# Patient Record
Sex: Male | Born: 1960 | Race: White | Hispanic: No | Marital: Married | Smoking: Former smoker
Health system: Southern US, Community
[De-identification: ages and names within clinical notes are randomized; demographics above are authoritative.]

## PROBLEM LIST (undated history)

## (undated) DIAGNOSIS — K219 Gastro-esophageal reflux disease without esophagitis: Secondary | ICD-10-CM

## (undated) HISTORY — PX: SHOULDER BIOPSY: SHX2404

## (undated) HISTORY — PX: APPENDECTOMY: SHX54

## (undated) HISTORY — DX: Gastro-esophageal reflux disease without esophagitis: K21.9

## (undated) HISTORY — PX: KNEE ARTHROSCOPY: SUR90

---

## 1997-04-28 ENCOUNTER — Ambulatory Visit (HOSPITAL_COMMUNITY): Admission: RE | Admit: 1997-04-28 | Discharge: 1997-04-28 | Payer: Self-pay | Admitting: *Deleted

## 1998-08-30 ENCOUNTER — Emergency Department (HOSPITAL_COMMUNITY): Admission: EM | Admit: 1998-08-30 | Discharge: 1998-08-30 | Payer: Self-pay | Admitting: Emergency Medicine

## 2001-03-06 ENCOUNTER — Encounter: Payer: Self-pay | Admitting: General Surgery

## 2001-03-06 ENCOUNTER — Ambulatory Visit (HOSPITAL_COMMUNITY): Admission: RE | Admit: 2001-03-06 | Discharge: 2001-03-06 | Payer: Self-pay | Admitting: General Surgery

## 2001-05-05 ENCOUNTER — Encounter: Payer: Self-pay | Admitting: Specialist

## 2001-05-09 ENCOUNTER — Encounter (INDEPENDENT_AMBULATORY_CARE_PROVIDER_SITE_OTHER): Payer: Self-pay | Admitting: Specialist

## 2001-05-09 ENCOUNTER — Ambulatory Visit (HOSPITAL_COMMUNITY): Admission: RE | Admit: 2001-05-09 | Discharge: 2001-05-09 | Payer: Self-pay | Admitting: Specialist

## 2003-11-11 ENCOUNTER — Ambulatory Visit: Payer: Self-pay | Admitting: Gastroenterology

## 2003-11-12 ENCOUNTER — Ambulatory Visit: Payer: Self-pay | Admitting: Gastroenterology

## 2004-07-24 ENCOUNTER — Ambulatory Visit: Payer: Self-pay | Admitting: Internal Medicine

## 2005-09-03 ENCOUNTER — Ambulatory Visit: Payer: Self-pay | Admitting: Internal Medicine

## 2005-09-27 ENCOUNTER — Ambulatory Visit: Payer: Self-pay

## 2006-04-02 ENCOUNTER — Ambulatory Visit: Payer: Self-pay | Admitting: Endocrinology

## 2006-09-23 DIAGNOSIS — E785 Hyperlipidemia, unspecified: Secondary | ICD-10-CM | POA: Insufficient documentation

## 2006-09-23 DIAGNOSIS — K219 Gastro-esophageal reflux disease without esophagitis: Secondary | ICD-10-CM | POA: Insufficient documentation

## 2006-09-23 DIAGNOSIS — Z9089 Acquired absence of other organs: Secondary | ICD-10-CM | POA: Insufficient documentation

## 2006-09-23 DIAGNOSIS — R002 Palpitations: Secondary | ICD-10-CM | POA: Insufficient documentation

## 2006-09-23 DIAGNOSIS — F17200 Nicotine dependence, unspecified, uncomplicated: Secondary | ICD-10-CM

## 2007-08-05 ENCOUNTER — Ambulatory Visit: Payer: Self-pay | Admitting: Internal Medicine

## 2007-08-05 DIAGNOSIS — H6690 Otitis media, unspecified, unspecified ear: Secondary | ICD-10-CM | POA: Insufficient documentation

## 2007-08-27 ENCOUNTER — Telehealth (INDEPENDENT_AMBULATORY_CARE_PROVIDER_SITE_OTHER): Payer: Self-pay | Admitting: *Deleted

## 2007-08-27 DIAGNOSIS — R42 Dizziness and giddiness: Secondary | ICD-10-CM | POA: Insufficient documentation

## 2007-09-09 ENCOUNTER — Encounter: Payer: Self-pay | Admitting: Internal Medicine

## 2008-10-05 ENCOUNTER — Encounter: Payer: Self-pay | Admitting: Emergency Medicine

## 2008-10-06 ENCOUNTER — Inpatient Hospital Stay (HOSPITAL_COMMUNITY): Admission: EM | Admit: 2008-10-06 | Discharge: 2008-10-08 | Payer: Self-pay | Admitting: Emergency Medicine

## 2010-04-14 LAB — BASIC METABOLIC PANEL
BUN: 16 mg/dL (ref 6–23)
CO2: 27 mEq/L (ref 19–32)
Calcium: 8.6 mg/dL (ref 8.4–10.5)
Chloride: 105 mEq/L (ref 96–112)
Chloride: 106 mEq/L (ref 96–112)
Creatinine, Ser: 0.88 mg/dL (ref 0.4–1.5)
GFR calc Af Amer: >60 mL/min (ref >60)
GFR calc Af Amer: >60 mL/min (ref >60)
GFR calc non Af Amer: >60 mL/min (ref >60)
Potassium: 4 mEq/L (ref 3.5–5.1)
Sodium: 138 mEq/L (ref 135–145)
Sodium: 139 mEq/L (ref 135–145)

## 2010-04-14 LAB — CBC
HCT: 46.3 % (ref 39.0–52.0)
Hemoglobin: 13.2 g/dL (ref 13.0–17.0)
MCV: 86.1 fL (ref 78.0–100.0)
Platelets: 243 10*3/uL (ref 150–400)
RBC: 5.38 MIL/uL (ref 4.22–5.81)
RBC: 4.5 MIL/uL (ref 4.22–5.81)
WBC: 14.6 10*3/uL — ABNORMAL HIGH (ref 4.0–10.5)
WBC: 10.1 10*3/uL (ref 4.0–10.5)

## 2010-04-14 LAB — APTT: aPTT: 26 seconds (ref 24–37)

## 2010-05-26 NOTE — Op Note (Signed)
[facility]  Patient:    Shaun Clarke, Shaun Clarke Visit Number: [id number] MRN: [id number]          Service Type: DSU Location: DAY Attending Physician:  Pierce Crane Dictated by:   Javier Docker, M.D. Proc. Date: 05/09/01 Admit Date:  05/09/2001                             Operative Report  PREOPERATIVE DIAGNOSIS:  Recurrent lipoma of the right shoulder.  POSTOPERATIVE DIAGNOSIS:  Recurrent lipoma of the right shoulder.  PROCEDURE:  Incision of the lipoma right shoulder, revision of the scar, right shoulder.  SURGEON:  Javier Docker, M.D.  ANESTHESIA:  General.  BRIEF HISTORY AND INDICATION:  A 50 year old who had had previous lipoma of the right shoulder resected by a general surgeon and that found that lipoma has recurred and the MRI has indicated that we could not rule out an osteosarcoma. Operative intervention is indicated with resection of the lipoma for pathological evaluation. Risks and benefits discussed, including bleeding, infection, injury to vascular structures, recurrent lipoma, residual scar tissue, etc.  TECHNIQUE:  The patient was supine in the beach-chair position. After satisfactory level of general anesthesia and one gram of Kefzol, the right shoulder was prepped and draped in the usual sterile fashion. Marcaine 0.25% with epinephrine was infiltrated in the subcutaneous tissue. Previous surgical scar was excised with a #15 blade through the subcutaneous tissue. The incision was extended on either side of the previous surgical scar. Immediately, in the subcutaneous tissue was a lipoma noted. It was, however, not fully encapsulated. This was secured with an Allis and then dissected free from the surrounding soft tissues utilizing tenotomy scissors. This was excised in its entirety down to the fascia overlying the acromion. This was in the posterior aspect of the acromion. The anterior leading edge was to the  [facility] joint. There was no extension in the [facility] joint that was noted. Electrocautery was used to achieve strict hemostasis. The subcutaneous tissue was the infiltrated with 0.25% Marcaine. The lipoma measured approximately 4 x 3 x 3 cm. This did not have any evidence of obvious necrosis or malignant tissue. This was sent to pathology for permanent analysis. Next, electrocautery was utilized to achieve strict hemostasis. The soft tissue was closed in multiple layers with 2-0 Vicryl simple sutures and the skin was reapproximated with 4-0 subcuticular Prolene reinforced with Steri-Strips. The wound was dressed with a compression dressing. The patient was placed in a sling, extubated without difficulty and transported to the recovery in satisfactory condition.  The patient tolerated the procedure well without complications. Dictated by:   Javier Docker, M.D. Attending Physician:  Pierce Crane DD:  05/09/01 TD:  05/11/01 Job: [id number]

## 2020-02-17 ENCOUNTER — Encounter: Payer: Self-pay | Admitting: Family Medicine

## 2020-02-17 ENCOUNTER — Other Ambulatory Visit: Payer: Self-pay

## 2020-02-17 ENCOUNTER — Ambulatory Visit (INDEPENDENT_AMBULATORY_CARE_PROVIDER_SITE_OTHER): Payer: 59 | Admitting: Family Medicine

## 2020-02-17 VITALS — BP 115/68 | HR 85 | Ht 68.0 in | Wt 176.0 lb

## 2020-02-17 DIAGNOSIS — F17200 Nicotine dependence, unspecified, uncomplicated: Secondary | ICD-10-CM

## 2020-02-17 DIAGNOSIS — Z0001 Encounter for general adult medical examination with abnormal findings: Secondary | ICD-10-CM | POA: Diagnosis not present

## 2020-02-17 DIAGNOSIS — K219 Gastro-esophageal reflux disease without esophagitis: Secondary | ICD-10-CM | POA: Diagnosis not present

## 2020-02-17 DIAGNOSIS — Z0000 Encounter for general adult medical examination without abnormal findings: Secondary | ICD-10-CM

## 2020-02-17 MED ORDER — SUCRALFATE 1 G PO TABS: 1.0000 g | ORAL_TABLET | Freq: Two times a day (BID) | ORAL | 2 refills | Status: DC

## 2020-02-17 MED ORDER — PANTOPRAZOLE SODIUM 40 MG PO TBEC: 40.0000 mg | DELAYED_RELEASE_TABLET | Freq: Every day | ORAL | 3 refills | Status: DC

## 2020-02-17 NOTE — Progress Notes (Signed)
BP 115/68   Pulse 85   Ht '5\' 8"'  (1.727 m)   Wt 176 lb (79.8 kg)   SpO2 97%   BMI 26.76 kg/m    Subjective:    Patient ID: Shaun Clarke, male    DOB: Mar 13, 1960, 60 y.o.   MRN: [id number]  HPI: Shaun Clarke is a 60 y.o. male presenting on 02/17/2020 for Medical Management of Chronic Issues (CPE) and Gastroesophageal Reflux   HPI Adult well Is coming in today for adult well exam and physical.  Patient denies any chest pain, shortness of breath, headaches or vision issues, abdominal complaints, diarrhea, nausea, vomiting, or joint issues.   GERD Patient is currently on medications currently except over-the-counter Pepcid and occasional Mylanta. He does complain of belching and burping bloating, he says he has had this off and on for over the past 16 years but it has been more constant over the past 5 months to where he is having a lot of. he denies any blood in her stool or lightheadedness or dizziness.   Relevant past medical, surgical, family and social history reviewed and updated as indicated. Interim medical history since our last visit reviewed. Allergies and medications reviewed and updated.  Review of Systems  Constitutional: Negative for chills and fever.  HENT: Negative for ear pain and tinnitus.   Eyes: Negative for pain.  Respiratory: Negative for cough, shortness of breath and wheezing.   Cardiovascular: Negative for chest pain, palpitations and leg swelling.  Gastrointestinal: Positive for abdominal pain and nausea. Negative for blood in stool, constipation, diarrhea, rectal pain and vomiting.  Genitourinary: Negative for dysuria and hematuria.  Musculoskeletal: Negative for back pain, gait problem and myalgias.  Skin: Negative for rash.  Neurological: Negative for dizziness, weakness and headaches.  Psychiatric/Behavioral: Negative for suicidal ideas.  All other systems reviewed and are negative.   Per HPI unless specifically indicated above  Social  History   Socioeconomic History  . Marital status: Married    Spouse name: Not on file  . Number of children: Not on file  . Years of education: Not on file  . Highest education level: Not on file  Occupational History  . Not on file  Tobacco Use  . Smoking status: Current Every Day Smoker    Packs/day: 1.00  . Smokeless tobacco: Never Used  Vaping Use  . Vaping Use: Never used  Substance and Sexual Activity  . Alcohol use: Yes    Alcohol/week: 3.0 standard drinks    Types: 1 Glasses of wine, 2 Cans of beer per week    Comment: per week  . Drug use: Never  . Sexual activity: Yes    Partners: Female  Other Topics Concern  . Not on file  Social History Narrative  . Not on file   Social Determinants of Health   Financial Resource Strain: Not on file  Food Insecurity: Not on file  Transportation Needs: Not on file  Physical Activity: Not on file  Stress: Not on file  Social Connections: Not on file  Intimate Partner Violence: Not on file    Past Surgical History:  Procedure Laterality Date  . APPENDECTOMY      Family History  Problem Relation Age of Onset  . Pancreatic cancer Mother   . Diabetes Father   . Anxiety disorder Sister   . Multiple sclerosis Sister   . COPD Maternal Grandfather     Allergies as of 02/17/2020      Reactions  Naproxen Anaphylaxis   Swelling of face and lips      Medication List    as of February 17, 2020  4:22 PM   You have not been prescribed any medications.        Objective:    BP 115/68   Pulse 85   Ht '5\' 8"'  (1.727 m)   Wt 176 lb (79.8 kg)   SpO2 97%   BMI 26.76 kg/m   Wt Readings from Last 3 Encounters:  02/17/20 176 lb (79.8 kg)    Physical Exam Vitals and nursing note reviewed.  Constitutional:      General: He is not in acute distress.    Appearance: He is well-developed and well-nourished. He is not diaphoretic.  Eyes:     General: No scleral icterus.    Extraocular Movements: EOM normal.      Conjunctiva/sclera: Conjunctivae normal.  Neck:     Thyroid: No thyromegaly.  Cardiovascular:     Rate and Rhythm: Normal rate and regular rhythm.     Pulses: Intact distal pulses.     Heart sounds: Normal heart sounds. No murmur heard.   Pulmonary:     Effort: Pulmonary effort is normal. No respiratory distress.     Breath sounds: Normal breath sounds. No wheezing.  Abdominal:     General: Abdomen is flat. Bowel sounds are normal. There is no distension.     Tenderness: There is abdominal tenderness in the epigastric area. There is no right CVA tenderness, left CVA tenderness, guarding or rebound.  Musculoskeletal:        General: No edema. Normal range of motion.     Cervical back: Neck supple.  Lymphadenopathy:     Cervical: No cervical adenopathy.  Skin:    General: Skin is warm and dry.     Findings: No rash.  Neurological:     Mental Status: He is alert and oriented to person, place, and time.     Coordination: Coordination normal.  Psychiatric:        Mood and Affect: Mood and affect normal.        Behavior: Behavior normal.         Assessment & Plan:   Problem List Items Addressed This Visit      Digestive   GERD   Relevant Medications   pantoprazole (PROTONIX) 40 MG tablet   sucralfate (CARAFATE) 1 g tablet   Other Relevant Orders   Ambulatory referral to Gastroenterology     Other   TOBACCO USER    Other Visit Diagnoses    Well adult exam    -  Primary   Relevant Orders   CBC with Differential/Platelet (Completed)   CMP14+EGFR (Completed)   Lipid panel (Completed)   TSH (Completed)    Will try Protonix and Carafate to help.  Also refer to gastroenterology.  If his stomach not getting any better than they can take it from there, also gave a sample for 2 weeks of Dexilant.   Follow up plan: Return in about 2 months (around 04/16/2020), or if symptoms worsen or fail to improve, for gerd.  Caryl Pina, MD [facility] 02/17/2020, 4:22 PM

## 2020-02-18 LAB — CBC WITH DIFFERENTIAL/PLATELET
Basophils Absolute: 0.1 10*3/uL (ref 0.0–0.2)
Basos: 1 %
EOS (ABSOLUTE): 0.1 10*3/uL (ref 0.0–0.4)
Eos: 2 %
Hematocrit: 47.1 % (ref 37.5–51.0)
Hemoglobin: 16.1 g/dL (ref 13.0–17.7)
Immature Grans (Abs): 0 10*3/uL (ref 0.0–0.1)
Immature Granulocytes: 0 %
Lymphocytes Absolute: 1.4 10*3/uL (ref 0.7–3.1)
Lymphs: 23 %
MCH: 28.7 pg (ref 26.6–33.0)
MCHC: 34.2 g/dL (ref 31.5–35.7)
MCV: 84 fL (ref 79–97)
Monocytes Absolute: 0.8 10*3/uL (ref 0.1–0.9)
Monocytes: 13 %
Neutrophils Absolute: 3.7 10*3/uL (ref 1.4–7.0)
Neutrophils: 61 %
Platelets: 268 10*3/uL (ref 150–450)
RBC: 5.61 x10E6/uL (ref 4.14–5.80)
RDW: 13 % (ref 11.6–15.4)
WBC: 6.1 10*3/uL (ref 3.4–10.8)

## 2020-02-18 LAB — CMP14+EGFR
ALT: 16 IU/L (ref 0–44)
AST: 17 IU/L (ref 0–40)
Albumin/Globulin Ratio: 2 (ref 1.2–2.2)
Albumin: 4.7 g/dL (ref 3.8–4.9)
Alkaline Phosphatase: 76 IU/L (ref 44–121)
BUN/Creatinine Ratio: 14 (ref 9–20)
BUN: 14 mg/dL (ref 6–24)
Bilirubin Total: 0.4 mg/dL (ref 0.0–1.2)
CO2: 21 mmol/L (ref 20–29)
Calcium: 9.7 mg/dL (ref 8.7–10.2)
Chloride: 103 mmol/L (ref 96–106)
Creatinine, Ser: 1.02 mg/dL (ref 0.76–1.27)
GFR calc Af Amer: 93 mL/min/{1.73_m2} (ref >59)
GFR calc non Af Amer: 81 mL/min/{1.73_m2} (ref >59)
Globulin, Total: 2.3 g/dL (ref 1.5–4.5)
Glucose: 91 mg/dL (ref 65–99)
Potassium: 4.2 mmol/L (ref 3.5–5.2)
Sodium: 138 mmol/L (ref 134–144)
Total Protein: 7 g/dL (ref 6.0–8.5)

## 2020-02-18 LAB — LIPID PANEL
Chol/HDL Ratio: 5.6 ratio — ABNORMAL HIGH (ref 0.0–5.0)
Cholesterol, Total: 203 mg/dL — ABNORMAL HIGH (ref 100–199)
HDL: 36 mg/dL — ABNORMAL LOW (ref >39)
LDL Chol Calc (NIH): 139 mg/dL — ABNORMAL HIGH (ref 0–99)
Triglycerides: 154 mg/dL — ABNORMAL HIGH (ref 0–149)
VLDL Cholesterol Cal: 28 mg/dL (ref 5–40)

## 2020-02-18 LAB — TSH: TSH: 2.27 u[IU]/mL (ref 0.450–4.500)

## 2020-02-29 ENCOUNTER — Ambulatory Visit (INDEPENDENT_AMBULATORY_CARE_PROVIDER_SITE_OTHER): Payer: 59 | Admitting: Family Medicine

## 2020-02-29 ENCOUNTER — Ambulatory Visit (INDEPENDENT_AMBULATORY_CARE_PROVIDER_SITE_OTHER): Payer: 59

## 2020-02-29 ENCOUNTER — Encounter: Payer: Self-pay | Admitting: Family Medicine

## 2020-02-29 ENCOUNTER — Other Ambulatory Visit: Payer: Self-pay

## 2020-02-29 VITALS — BP 121/74 | HR 91 | Ht 68.0 in | Wt 176.0 lb

## 2020-02-29 DIAGNOSIS — M546 Pain in thoracic spine: Secondary | ICD-10-CM

## 2020-02-29 MED ORDER — METHYLPREDNISOLONE ACETATE 80 MG/ML IJ SUSP
80.0000 mg | Freq: Once | INTRAMUSCULAR | Status: AC
  Administered 2020-02-29: 80 mg via INTRAMUSCULAR

## 2020-02-29 NOTE — Progress Notes (Signed)
BP 121/74   Pulse 91   Ht 5\' 8"  (1.727 m)   Wt 176 lb (79.8 kg)   SpO2 97%   BMI 26.76 kg/m    Subjective:   Patient ID: Shaun Clarke, male    DOB: February 01, 1960, 60 y.o.   MRN: [id number]  HPI: Shaun Clarke is a 60 y.o. male presenting on 02/29/2020 for Back Pain   HPI Low back pain Patient is coming in for low back pain that is been bothering him over the past few weeks.  He says it started 1 evening and he feels like it has been going on since then.  He says he feels pain at night but then is able to sleep when he wakes up he feels better but then as the day progresses the back pain gets worse especially if he is on his feet or doing something.  He says sometimes he gets to the point where around 1 or 2 PM he has difficulty even sitting in the car on the way back home.  He does still complain of epigastric bloating and gas and abdominal pain but that has improved.  Patient denies any numbness or weakness or fevers or chills.  He does say hurts and the mid back region more on the left side.  Relevant past medical, surgical, family and social history reviewed and updated as indicated. Interim medical history since our last visit reviewed. Allergies and medications reviewed and updated.  Review of Systems  Constitutional: Negative for chills and fever.  Eyes: Negative for discharge.  Respiratory: Negative for shortness of breath and wheezing.   Cardiovascular: Negative for chest pain and leg swelling.  Gastrointestinal: Positive for abdominal pain. Negative for blood in stool, constipation, diarrhea, nausea and vomiting.  Genitourinary: Negative for dysuria, flank pain, frequency, hematuria and urgency.  Musculoskeletal: Positive for back pain. Negative for gait problem.  Skin: Negative for rash.  All other systems reviewed and are negative.   Per HPI unless specifically indicated above   Allergies as of 02/29/2020      Reactions   Naproxen Anaphylaxis   Swelling of  face and lips      Medication List       Accurate as of February 29, 2020 11:51 AM. If you have any questions, ask your nurse or doctor.        pantoprazole 40 MG tablet Commonly known as: PROTONIX Take 1 tablet (40 mg total) by mouth daily.   sucralfate 1 g tablet Commonly known as: Carafate Take 1 tablet (1 g total) by mouth 2 (two) times daily.        Objective:   BP 121/74   Pulse 91   Ht 5\' 8"  (1.727 m)   Wt 176 lb (79.8 kg)   SpO2 97%   BMI 26.76 kg/m   Wt Readings from Last 3 Encounters:  02/29/20 176 lb (79.8 kg)  02/17/20 176 lb (79.8 kg)    Physical Exam Vitals and nursing note reviewed.  Constitutional:      General: He is not in acute distress.    Appearance: He is well-developed and well-nourished. He is not diaphoretic.  Eyes:     General: No scleral icterus.    Extraocular Movements: EOM normal.     Conjunctiva/sclera: Conjunctivae normal.  Neck:     Thyroid: No thyromegaly.  Cardiovascular:     Pulses: Intact distal pulses.  Abdominal:     General: Abdomen is flat. Bowel sounds are normal. There  is no distension.     Tenderness: There is no abdominal tenderness. There is no right CVA tenderness, left CVA tenderness, guarding or rebound.  Musculoskeletal:        General: No edema. Normal range of motion.  Skin:    General: Skin is warm and dry.     Findings: No rash.  Neurological:     Mental Status: He is alert and oriented to person, place, and time.     Coordination: Coordination normal.  Psychiatric:        Mood and Affect: Mood and affect normal.        Behavior: Behavior normal.      Thoracic x-ray: Flattened disks throughout the spine, osteophytes and arthritic processes throughout the spine.  Await final read from radiologist  Assessment & Plan:   Problem List Items Addressed This Visit   None   Visit Diagnoses    Acute left-sided thoracic back pain    -  Primary   Relevant Medications   methylPREDNISolone acetate  (DEPO-MEDROL) injection 80 mg (Start on 02/29/2020 12:15 PM)   Other Relevant Orders   DG Thoracic Spine 2 View      Possibly arthritis versus referred pain from his stomach, he is still waiting to get into gastroenterology.  He is slightly better on his stomach from the [place] sample we gave him.  He is now taking the Protonix  Patient says he has enough but he is currently taking his wife's baclofen and diclofenac and will continue with those because she has enough left over.  He did not want his own prescription at this point. Follow up plan: Return if symptoms worsen or fail to improve.  Counseling provided for all of the vaccine components No orders of the defined types were placed in this encounter.   Caryl Pina, MD [facility] 02/29/2020, 11:51 AM

## 2020-03-17 ENCOUNTER — Ambulatory Visit
Admission: RE | Admit: 2020-03-17 | Discharge: 2020-03-17 | Disposition: A | Discharge status: Home / Self Care | Payer: Self-pay | Source: Ambulatory Visit | Attending: Physician Assistant | Admitting: Physician Assistant

## 2020-03-17 ENCOUNTER — Other Ambulatory Visit: Payer: Self-pay | Admitting: Physician Assistant

## 2020-03-17 DIAGNOSIS — R1013 Epigastric pain: Secondary | ICD-10-CM

## 2020-03-17 DIAGNOSIS — R091 Pleurisy: Secondary | ICD-10-CM

## 2020-04-01 ENCOUNTER — Ambulatory Visit
Admission: RE | Admit: 2020-04-01 | Discharge: 2020-04-01 | Disposition: A | Discharge status: Home / Self Care | Payer: 59 | Source: Ambulatory Visit | Attending: Physician Assistant | Admitting: Physician Assistant

## 2020-04-01 ENCOUNTER — Other Ambulatory Visit: Payer: Self-pay

## 2020-04-01 DIAGNOSIS — R1013 Epigastric pain: Secondary | ICD-10-CM

## 2020-04-01 MED ORDER — IOPAMIDOL (ISOVUE-300) INJECTION 61%
100.0000 mL | Freq: Once | INTRAVENOUS | Status: AC | PRN
  Administered 2020-04-01: 100 mL via INTRAVENOUS

## 2020-04-06 ENCOUNTER — Encounter: Payer: Self-pay | Admitting: Family Medicine

## 2020-04-06 ENCOUNTER — Ambulatory Visit (HOSPITAL_COMMUNITY)
Admission: RE | Admit: 2020-04-06 | Discharge: 2020-04-06 | Disposition: A | Discharge status: Home / Self Care | Payer: 59 | Source: Ambulatory Visit | Attending: Family Medicine | Admitting: Family Medicine

## 2020-04-06 ENCOUNTER — Other Ambulatory Visit: Payer: Self-pay | Admitting: Family Medicine

## 2020-04-06 ENCOUNTER — Ambulatory Visit (INDEPENDENT_AMBULATORY_CARE_PROVIDER_SITE_OTHER): Payer: 59 | Admitting: Family Medicine

## 2020-04-06 ENCOUNTER — Other Ambulatory Visit: Payer: Self-pay

## 2020-04-06 VITALS — BP 109/73 | HR 72 | Temp 97.3°F | Ht 68.0 in | Wt 164.2 lb

## 2020-04-06 DIAGNOSIS — C7949 Secondary malignant neoplasm of other parts of nervous system: Secondary | ICD-10-CM

## 2020-04-06 DIAGNOSIS — M546 Pain in thoracic spine: Secondary | ICD-10-CM

## 2020-04-06 DIAGNOSIS — K219 Gastro-esophageal reflux disease without esophagitis: Secondary | ICD-10-CM | POA: Diagnosis not present

## 2020-04-06 DIAGNOSIS — R29898 Other symptoms and signs involving the musculoskeletal system: Secondary | ICD-10-CM

## 2020-04-06 DIAGNOSIS — M4804 Spinal stenosis, thoracic region: Secondary | ICD-10-CM | POA: Diagnosis not present

## 2020-04-06 MED ORDER — PANTOPRAZOLE SODIUM 40 MG PO TBEC: 80.0000 mg | DELAYED_RELEASE_TABLET | Freq: Every day | ORAL | 3 refills | Status: DC

## 2020-04-06 MED ORDER — TRAMADOL HCL 50 MG PO TABS: 50.0000 mg | ORAL_TABLET | Freq: Two times a day (BID) | ORAL | 1 refills | Status: DC | PRN

## 2020-04-06 NOTE — Progress Notes (Signed)
BP 109/73   Pulse 72   Temp (!) 97.3 F (36.3 C) (Temporal)   Ht 5\' 8"  (1.727 m)   Wt 164 lb 3.2 oz (74.5 kg)   SpO2 97%   BMI 24.97 kg/m    Subjective:   Patient ID: Shaun Clarke, male    DOB: 1960/05/08, 60 y.o.   MRN: [id number]  HPI: Shaun Clarke is a 60 y.o. male presenting on 04/06/2020 for Back Pain   HPI Patient still seems like he has 2 separate things going on, it does seem like he has GERD and he says that is improving although there are some days that are still bad but then he describes bilateral mid back pain and now he has developed over the past couple days weakness going down into his legs and difficulty walking with it.  He has been using muscle relaxers and taking Protonix twice daily and has an appointment with GI.  He says he is having decreased appetite and hurts to eat.  He says he cannot get comfortable at night and has to lay with his arms up over his head to finally get comfortable.  He says he has weakness in his legs and difficulty walking and was taking the diclofenac as well and it was not seeming to help.  Relevant past medical, surgical, family and social history reviewed and updated as indicated. Interim medical history since our last visit reviewed. Allergies and medications reviewed and updated.  Review of Systems  Constitutional: Negative for chills and fever.  Respiratory: Negative for shortness of breath and wheezing.   Cardiovascular: Negative for chest pain and leg swelling.  Gastrointestinal: Positive for abdominal pain. Negative for blood in stool and constipation.  Musculoskeletal: Positive for back pain and gait problem. Negative for arthralgias.  Skin: Negative for rash.  Neurological: Positive for weakness. Negative for dizziness, light-headedness, numbness and headaches.  All other systems reviewed and are negative.   Per HPI unless specifically indicated above   Allergies as of 04/06/2020      Reactions   Naproxen  Anaphylaxis   Swelling of face and lips      Medication List       Accurate as of April 06, 2020 10:56 AM. If you have any questions, ask your nurse or doctor.        pantoprazole 40 MG tablet Commonly known as: PROTONIX Take 1 tablet (40 mg total) by mouth daily.        Objective:   BP 109/73   Pulse 72   Temp (!) 97.3 F (36.3 C) (Temporal)   Ht 5\' 8"  (1.727 m)   Wt 164 lb 3.2 oz (74.5 kg)   SpO2 97%   BMI 24.97 kg/m   Wt Readings from Last 3 Encounters:  04/06/20 164 lb 3.2 oz (74.5 kg)  02/29/20 176 lb (79.8 kg)  02/17/20 176 lb (79.8 kg)    Physical Exam Vitals and nursing note reviewed.  Constitutional:      General: He is not in acute distress.    Appearance: He is well-developed. He is not diaphoretic.  Eyes:     General: No scleral icterus.    Conjunctiva/sclera: Conjunctivae normal.  Neck:     Thyroid: No thyromegaly.  Abdominal:     Tenderness: There is abdominal tenderness in the epigastric area. There is no right CVA tenderness, left CVA tenderness, guarding or rebound.  Musculoskeletal:        General: Normal range of motion.  Thoracic back: No swelling, deformity, lacerations, spasms, tenderness or bony tenderness. Normal range of motion. No scoliosis.     Comments: Pain with range of motion  Skin:    General: Skin is warm and dry.     Findings: No rash.  Neurological:     Mental Status: He is alert and oriented to person, place, and time.     Coordination: Coordination normal.  Psychiatric:        Behavior: Behavior normal.       Assessment & Plan:   Problem List Items Addressed This Visit      Digestive   GERD   Relevant Medications   pantoprazole (PROTONIX) 40 MG tablet    Other Visit Diagnoses    Acute left-sided thoracic back pain    -  Primary   Relevant Medications   traMADol (ULTRAM) 50 MG tablet   Other Relevant Orders   MR Thoracic Spine Wo Contrast   Weakness of both lower extremities       Relevant  Medications   traMADol (ULTRAM) 50 MG tablet   Other Relevant Orders   MR Thoracic Spine Wo Contrast      Patient still seems like he has 2 separate things going on, it does seem like he has GERD and he says that is improving although there are some days that are still bad but then he describes bilateral mid back pain and now he has developed over the past couple days weakness going down into his legs and difficulty walking with it.  Because of this difficulty and progression and failure of using muscle relaxers and steroids in the past few weeks we are going to try for an MRI.  Give tramadol to help for his pain in the meantime Follow up plan: Return if symptoms worsen or fail to improve.  Counseling provided for all of the vaccine components No orders of the defined types were placed in this encounter.   Caryl Pina, MD [facility] 04/06/2020, 10:56 AM

## 2020-04-06 NOTE — Progress Notes (Unsigned)
Placed urgent referrals for neurosurgery and oncology, discussed results with patient and his wife and family that was present on the phone

## 2020-04-07 ENCOUNTER — Encounter (HOSPITAL_COMMUNITY): Payer: Self-pay | Admitting: Pharmacy Technician

## 2020-04-07 ENCOUNTER — Inpatient Hospital Stay (HOSPITAL_COMMUNITY)
Admission: EM | Admit: 2020-04-07 | Discharge: 2020-04-11 | DRG: 519 | Disposition: A | Discharge status: Home / Self Care | Payer: 59 | Attending: Internal Medicine | Admitting: Internal Medicine

## 2020-04-07 ENCOUNTER — Emergency Department (HOSPITAL_COMMUNITY): Payer: 59

## 2020-04-07 ENCOUNTER — Other Ambulatory Visit: Payer: Self-pay

## 2020-04-07 ENCOUNTER — Telehealth: Payer: Self-pay | Admitting: Hematology and Oncology

## 2020-04-07 DIAGNOSIS — C7951 Secondary malignant neoplasm of bone: Secondary | ICD-10-CM | POA: Diagnosis present

## 2020-04-07 DIAGNOSIS — Z20822 Contact with and (suspected) exposure to covid-19: Secondary | ICD-10-CM | POA: Diagnosis not present

## 2020-04-07 DIAGNOSIS — C7989 Secondary malignant neoplasm of other specified sites: Secondary | ICD-10-CM

## 2020-04-07 DIAGNOSIS — Z79899 Other long term (current) drug therapy: Secondary | ICD-10-CM | POA: Diagnosis not present

## 2020-04-07 DIAGNOSIS — Z886 Allergy status to analgesic agent status: Secondary | ICD-10-CM | POA: Diagnosis not present

## 2020-04-07 DIAGNOSIS — E785 Hyperlipidemia, unspecified: Secondary | ICD-10-CM | POA: Diagnosis present

## 2020-04-07 DIAGNOSIS — C7949 Secondary malignant neoplasm of other parts of nervous system: Secondary | ICD-10-CM | POA: Diagnosis not present

## 2020-04-07 DIAGNOSIS — C801 Malignant (primary) neoplasm, unspecified: Secondary | ICD-10-CM | POA: Diagnosis present

## 2020-04-07 DIAGNOSIS — F17210 Nicotine dependence, cigarettes, uncomplicated: Secondary | ICD-10-CM | POA: Diagnosis present

## 2020-04-07 DIAGNOSIS — R222 Localized swelling, mass and lump, trunk: Secondary | ICD-10-CM | POA: Diagnosis not present

## 2020-04-07 DIAGNOSIS — M4804 Spinal stenosis, thoracic region: Secondary | ICD-10-CM | POA: Diagnosis present

## 2020-04-07 DIAGNOSIS — M549 Dorsalgia, unspecified: Secondary | ICD-10-CM

## 2020-04-07 DIAGNOSIS — K219 Gastro-esophageal reflux disease without esophagitis: Secondary | ICD-10-CM | POA: Diagnosis present

## 2020-04-07 DIAGNOSIS — Z419 Encounter for procedure for purposes other than remedying health state, unspecified: Secondary | ICD-10-CM

## 2020-04-07 DIAGNOSIS — C799 Secondary malignant neoplasm of unspecified site: Secondary | ICD-10-CM | POA: Diagnosis present

## 2020-04-07 LAB — CBC WITH DIFFERENTIAL/PLATELET
Abs Immature Granulocytes: 0.03 10*3/uL (ref 0.00–0.07)
Basophils Absolute: 0.1 10*3/uL (ref 0.0–0.1)
Basophils Relative: 1 %
Eosinophils Absolute: 0.1 10*3/uL (ref 0.0–0.5)
Eosinophils Relative: 1 %
HCT: 45.7 % (ref 39.0–52.0)
Hemoglobin: 15 g/dL (ref 13.0–17.0)
Immature Granulocytes: 1 %
Lymphocytes Relative: 14 %
Lymphs Abs: 0.9 10*3/uL (ref 0.7–4.0)
MCH: 28.8 pg (ref 26.0–34.0)
MCHC: 32.8 g/dL (ref 30.0–36.0)
MCV: 87.7 fL (ref 80.0–100.0)
Monocytes Absolute: 0.8 10*3/uL (ref 0.1–1.0)
Monocytes Relative: 13 %
Neutro Abs: 4.3 10*3/uL (ref 1.7–7.7)
Neutrophils Relative %: 70 %
Platelets: 257 10*3/uL (ref 150–400)
RBC: 5.21 MIL/uL (ref 4.22–5.81)
RDW: 15 % (ref 11.5–15.5)
WBC: 6.1 10*3/uL (ref 4.0–10.5)
nRBC: 0 % (ref 0.0–0.2)

## 2020-04-07 LAB — COMPREHENSIVE METABOLIC PANEL
ALT: 27 U/L (ref 0–44)
AST: 21 U/L (ref 15–41)
Albumin: 4 g/dL (ref 3.5–5.0)
Alkaline Phosphatase: 55 U/L (ref 38–126)
Anion gap: 7 (ref 5–15)
BUN: 10 mg/dL (ref 6–20)
CO2: 27 mmol/L (ref 22–32)
Calcium: 9.1 mg/dL (ref 8.9–10.3)
Chloride: 102 mmol/L (ref 98–111)
Creatinine, Ser: 0.94 mg/dL (ref 0.61–1.24)
GFR, Estimated: >60 mL/min (ref >60)
Glucose, Bld: 104 mg/dL — ABNORMAL HIGH (ref 70–99)
Potassium: 4 mmol/L (ref 3.5–5.1)
Sodium: 136 mmol/L (ref 135–145)
Total Bilirubin: 0.8 mg/dL (ref 0.3–1.2)
Total Protein: 6.4 g/dL — ABNORMAL LOW (ref 6.5–8.1)

## 2020-04-07 LAB — RESP PANEL BY RT-PCR (FLU A&B, COVID) ARPGX2
Influenza A by PCR: NEGATIVE
Influenza B by PCR: NEGATIVE
SARS Coronavirus 2 by RT PCR: NEGATIVE

## 2020-04-07 LAB — PSA: Prostatic Specific Antigen: 1.27 ng/mL (ref 0.00–4.00)

## 2020-04-07 LAB — HIV ANTIBODY (ROUTINE TESTING W REFLEX): HIV Screen 4th Generation wRfx: NONREACTIVE

## 2020-04-07 MED ORDER — PANTOPRAZOLE SODIUM 40 MG PO TBEC
40.0000 mg | DELAYED_RELEASE_TABLET | Freq: Two times a day (BID) | ORAL | Status: DC
  Administered 2020-04-08 – 2020-04-11 (×4): 40 mg via ORAL
  Filled 2020-04-07 (×5): qty 1

## 2020-04-07 MED ORDER — MORPHINE SULFATE (PF) 2 MG/ML IV SOLN
2.0000 mg | INTRAVENOUS | Status: DC | PRN
  Administered 2020-04-07 – 2020-04-10 (×3): 2 mg via INTRAVENOUS
  Filled 2020-04-07 (×3): qty 1

## 2020-04-07 MED ORDER — IOHEXOL 350 MG/ML SOLN
80.0000 mL | Freq: Once | INTRAVENOUS | Status: AC | PRN
  Administered 2020-04-07: 80 mL via INTRAVENOUS

## 2020-04-07 MED ORDER — DEXTROSE-NACL 5-0.45 % IV SOLN: INTRAVENOUS | Status: DC

## 2020-04-07 MED ORDER — ACETAMINOPHEN 500 MG PO TABS: 500.0000 mg | ORAL_TABLET | Freq: Four times a day (QID) | ORAL | Status: DC | PRN

## 2020-04-07 MED ORDER — ACETAMINOPHEN 325 MG PO TABS
650.0000 mg | ORAL_TABLET | Freq: Four times a day (QID) | ORAL | Status: DC
  Administered 2020-04-07 – 2020-04-10 (×7): 650 mg via ORAL
  Filled 2020-04-07 (×9): qty 2

## 2020-04-07 MED ORDER — ACETAMINOPHEN 325 MG PO TABS
650.0000 mg | ORAL_TABLET | Freq: Four times a day (QID) | ORAL | Status: DC | PRN
  Administered 2020-04-07: 650 mg via ORAL
  Filled 2020-04-07: qty 2

## 2020-04-07 MED ORDER — ZOLPIDEM TARTRATE 5 MG PO TABS
5.0000 mg | ORAL_TABLET | Freq: Every evening | ORAL | Status: DC | PRN
Start: 2020-04-07 — End: 2020-04-11

## 2020-04-07 MED ORDER — DEXAMETHASONE SODIUM PHOSPHATE 4 MG/ML IJ SOLN
4.0000 mg | Freq: Four times a day (QID) | INTRAMUSCULAR | Status: DC
  Administered 2020-04-07 – 2020-04-08 (×3): 4 mg via INTRAVENOUS
  Filled 2020-04-07 (×3): qty 1

## 2020-04-07 MED ORDER — HEPARIN SODIUM (PORCINE) 5000 UNIT/ML IJ SOLN
5000.0000 [IU] | Freq: Three times a day (TID) | INTRAMUSCULAR | Status: DC
  Administered 2020-04-07: 5000 [IU] via SUBCUTANEOUS
  Filled 2020-04-07: qty 1

## 2020-04-07 MED ORDER — SUCRALFATE 1 G PO TABS
1.0000 g | ORAL_TABLET | Freq: Two times a day (BID) | ORAL | Status: DC
  Administered 2020-04-08 – 2020-04-11 (×3): 1 g via ORAL
  Filled 2020-04-07 (×9): qty 1

## 2020-04-07 NOTE — H&P (Signed)
History and Physical    Shaun Clarke BOF:[id number] DOB: [birth year] DOA: 04/07/2020  PCP: Dettinger, Fransisca Kaufmann, MD (Confirm with patient/family/NH records and if not entered, this has to be entered at [facility] point of entry) Patient coming from: home  I have personally briefly reviewed patient's old medical records in [place]  Chief Complaint: spinal stenosis  HPI: Shaun Clarke is a 60 y.o. male with medical history significant of HLD but otherwise healthy. He has had increasing GI pain that did not resolve with PPI plus carafate medication. He came to CT abd/pelvis which was notable for diverticulosis and gastric wall thickening. He came to MRI which revealed soft tissue lesions in the thoracic spine region with encroachment into the spinal canal space causing severe spine stenosis and cord flattening w/o edema. 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 He presents to [facility] for evaluation for NS intervention.   ED Course: T 98.5  105/70  HR 54  RR 16. Cmet nl, CBCD nl. Covid Negative.CT chest with gastric wall thickening, 67mm ndule LUL, right epiphrenic node enlargement. NS  Consult recommends surgery 04/08/20 and for patient to be admitted by the medicine service.  Review of Systems: As per HPI otherwise 10 point review of systems negative.    Past Medical History:  Diagnosis Date  . GERD (gastroesophageal reflux disease)     Past Surgical History:  Procedure Laterality Date  . APPENDECTOMY    . KNEE ARTHROSCOPY     right knee 3 times  . SHOULDER BIOPSY     right shoulder lipoma removal   Soc Hx - married 35 years. Two sons, 1 grand daughter. 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 He and his wife live independently.    reports that he has been smoking. He has been smoking about 1.00 pack per day. He has never used smokeless tobacco. He reports current alcohol use of about 3.0 standard drinks of alcohol per  week. He reports that he does not use drugs.  Allergies  Allergen Reactions  . Naproxen Anaphylaxis, Swelling and Other (See Comments)    Swelling of face and lips     Family History  Problem Relation Age of Onset  . Pancreatic cancer Mother   . Diabetes Father   . Anxiety disorder Sister   . Multiple sclerosis Sister   . COPD Maternal Grandfather      Prior to Admission medications   Medication Sig Start Date End Date Taking? Authorizing Provider  acetaminophen (TYLENOL) 500 MG tablet Take 500-1,000 mg by mouth every 6 (six) hours as needed (for pain).   Yes [provider]  pantoprazole (PROTONIX) 40 MG tablet Take 2 tablets (80 mg total) by mouth daily. Patient taking differently: Take 40 mg by mouth 2 (two) times daily before a meal. 04/06/20  Yes Dettinger, Fransisca Kaufmann, MD  sucralfate (CARAFATE) 1 g tablet Take 1 g by mouth 2 (two) times daily.   Yes [provider]  traMADol (ULTRAM) 50 MG tablet Take 1 tablet (50 mg total) by mouth every 12 (twelve) hours as needed. Patient taking differently: Take 50 mg by mouth every 12 (twelve) hours as needed (for pain). 04/06/20  Yes Dettinger, Fransisca Kaufmann, MD    Physical Exam: Vitals:   04/07/20 1402 04/07/20 1605  BP: 115/66 105/70  Pulse: 63 (!) 54  Resp: 18 16  Temp: 98.5 F (36.9 C) 98.5 F (36.9 C)  TempSrc: Oral Oral  SpO2: 98% 96%     Vitals:   04/07/20 1402 04/07/20 1605  BP: 115/66 105/70  Pulse: 63 (!) 54  Resp: 18 16  Temp: 98.5 F (36.9 C) 98.5 F (36.9 C)  TempSrc: Oral Oral  SpO2: 98% 96%   General:  WNWD man in no distress Eyes: PERRL, lids and conjunctivae normal ENMT: Mucous membranes are moist. Posterior pharynx clear of any exudate or lesions.Normal dentition.  Neck: normal, supple, no masses, no thyromegaly Nodes: no adenopathy in the inguinal, axillary, supraclavicular,or cerivcal regions. Respiratory: clear to auscultation bilaterally, no wheezing, no crackles. Normal respiratory  effort. No accessory muscle use.  Cardiovascular: Regular rate and rhythm, no murmurs / rubs / gallops. No extremity edema. 2+ pedal pulses. No carotid bruits.  Abdomen: no tenderness, no masses palpated. No hepatosplenomegaly. Bowel sounds positive.  Musculoskeletal: no clubbing / cyanosis. No joint deformity upper and lower extremities. Good ROM, no contractures. Normal muscle tone.  Skin: no rashes, lesions, ulcers. No induration Neurologic: CN 2-12 grossly intact. Sensation intact, DTR normal. Strength 5/5 in all 4.  Psychiatric: Normal judgment and insight. Alert and oriented x 3. Normal mood.     Labs on Admission: I have personally reviewed following labs and imaging studies  CBC: Recent Labs  Lab 04/07/20 1452  WBC 6.1  NEUTROABS 4.3  HGB 15.0  HCT 45.7  MCV 87.7  PLT 250   Basic Metabolic Panel: Recent Labs  Lab 04/07/20 1452  NA 136  K 4.0  CL 102  CO2 27  GLUCOSE 104*  BUN 10  CREATININE 0.94  CALCIUM 9.1   GFR: Estimated Creatinine Clearance: 82.9 mL/min (by C-G formula based on SCr of 0.94 mg/dL). Liver Function Tests: Recent Labs  Lab 04/07/20 1452  AST 21  ALT 27  ALKPHOS 55  BILITOT 0.8  PROT 6.4*  ALBUMIN 4.0   No results for input(s): LIPASE, AMYLASE in the last 168 hours. No results for input(s): AMMONIA in the last 168 hours. Coagulation Profile: No results for input(s): INR, PROTIME in the last 168 hours. Cardiac Enzymes: No results for input(s): CKTOTAL, CKMB, CKMBINDEX, TROPONINI in the last 168 hours. BNP (last 3 results) No results for input(s): PROBNP in the last 8760 hours. HbA1C: No results for input(s): HGBA1C in the last 72 hours. CBG: No results for input(s): GLUCAP in the last 168 hours. Lipid Profile: No results for input(s): CHOL, HDL, LDLCALC, TRIG, CHOLHDL, LDLDIRECT in the last 72 hours. Thyroid Function Tests: No results for input(s): TSH, T4TOTAL, FREET4, T3FREE, THYROIDAB in the last 72 hours. Anemia Panel: No  results for input(s): VITAMINB12, FOLATE, FERRITIN, TIBC, IRON, RETICCTPCT in the last 72 hours. Urine analysis: No results found for: COLORURINE, APPEARANCEUR, LABSPEC, PHURINE, GLUCOSEU, HGBUR, BILIRUBINUR, KETONESUR, PROTEINUR, UROBILINOGEN, NITRITE, LEUKOCYTESUR  Radiological Exams on Admission: CT Chest W Contrast  Result Date: 04/07/2020 CLINICAL DATA:  Cancer of unknown primary, osseous metastatic disease on MRI of the thoracic spine EXAM: CT CHEST WITH CONTRAST TECHNIQUE: Multidetector CT imaging of the chest was performed during intravenous contrast administration. CONTRAST:  17mL OMNIPAQUE IOHEXOL 350 MG/ML SOLN COMPARISON:  04/06/2020, 04/01/2020, 03/17/2020 FINDINGS: Cardiovascular: The heart and great vessels are unremarkable without pericardial effusion. Minimal atherosclerosis of the aortic arch. No aneurysm or dissection. Mediastinum/Nodes: No enlarged mediastinal, hilar, or axillary lymph nodes. Thyroid gland, trachea, and esophagus demonstrate no significant findings. Lungs/Pleura: Areas of linear consolidation within the bilateral lower lobes most consistent with atelectasis or scarring. Mild background emphysema, upper lobe  predominant, without superimposed airspace disease, effusion, or pneumothorax. Indeterminate 6 mm left upper lobe nodule image 107/7. No other nodules or masses. Upper Abdomen: Persistent mural. Gastric malignancy cannot thickening of the gastric fundus be excluded given the previous MRI findings. There is an enlarged epiphrenic lymph node on the right, measuring 12 mm in short axis reference image 125 of series 3. Musculoskeletal: Paraspinal soft tissue masses are seen from T5 through T10, compatible with metastatic disease seen on earlier MRI. The epidural metastatic disease and diffuse bony metastases seen on MRI are less apparent by CT. There are no acute displaced fractures. Reconstructed images demonstrate no additional findings. IMPRESSION: 1. Thoracic paraspinal  soft tissue masses consistent with metastatic disease seen on preceding MRI. Please refer to MRI report detailing thoracic osseous metastases, paraspinal metastatic disease, as well as epidural extension of tumor. 2. Persistent mural thickening of the gastric fundus. Gastric malignancy cannot be excluded given the thoracic spine findings. Endoscopy may be useful. 3. Indeterminate left upper lobe 6 mm pulmonary nodule. Close attention on follow-up is recommended. 4. Enlarged right epiphrenic lymph node, concerning for metastatic disease. 5. Aortic Atherosclerosis (ICD10-I70.0) and Emphysema (ICD10-J43.9). Electronically Signed   By: Randa Ngo M.D.   On: 04/07/2020 19:29   MR Thoracic Spine Wo Contrast  Result Date: 04/06/2020 CLINICAL DATA:  Back pain and weakness in the legs with difficulty walking. EXAM: MRI THORACIC SPINE WITHOUT CONTRAST TECHNIQUE: Multiplanar, multisequence MR imaging of the thoracic spine was performed. No intravenous contrast was administered. COMPARISON:  Thoracic spine radiographs 02/29/2020 FINDINGS: Alignment:  Normal. Vertebrae: Multiple T1 hypointense, STIR hyperintense lesions throughout the thoracic spine including prominent involvement of the vertebral bodies and posterior elements from T6-T9. Large volume epidural tumor dorsally and laterally in the spinal canal from T5-T8 measuring up to 1 cm in AP thickness and resulting in severe spinal stenosis with complete CSF effacement and moderate cord flattening. Extraosseous tumor anterior and lateral to the vertebral bodies from T6-T10. No fracture. Small Schmorl's nodes along the T6 and T7 inferior endplates. Suspected osseous metastatic disease in the cervical spine on large field of view sagittal counting images (C2 and C3 vertebral bodies and left C4 posterior elements), not well evaluated. Cord:  No cord edema. Paraspinal and other soft tissues: Paraspinal tumor as above. Disc levels: Severe spinal stenosis in the  midthoracic spine due to epidural tumor as above. Mild thoracic spondylosis without evidence of significant stenosis elsewhere. IMPRESSION: Extensive osseous metastatic disease including large volume paraspinal and epidural tumor in the mid and lower thoracic spine. Epidural tumor results in severe spinal stenosis and cord flattening without evidence of cord edema. These results will be called to the ordering clinician or representative by the Radiologist Assistant, and communication documented in the PACS or [employer]. Electronically Signed   By: Logan Bores M.D.   On: 04/06/2020 17:38    EKG: Independently reviewed. No EKG done in ED  Assessment/Plan Active Problems:   Spinal stenosis of thoracic region   Metastatic disease (HCC)   HLD (hyperlipidemia)   1. ONcology - patient with metastatic lesion soft tissue and osseous with severe spinal stenosis w/o edema. No primary site identified. He does have gastric wall thickening, a benign appearing LUL nodule. Plan Med-surg admit  NS - for decompression tomorrow, tissue sampling desireable  Scheduled APAP for pain, MS IV for uncontrolled pain  Oncolgy appointment was schedule as outpatient for 04/08/20 but patient notified them of admission and an inpatient consult is to follow  May come to EGD for diagnosis if no tissue diagnosis after surgery. Dr. Watt Climes is patients GI doctor.   Continue PPI and carafate for now.   DVT prophylaxis: heparin SQ  Code Status: full code (Full/Partial (specify details) Family Communication: wife present during interview and exam. Answered all questions. (Specify name, relationship. Do not write "discussed with patient". Specify tel # if discussed over the phone) Disposition Plan: TBD (specify when and where you expect patient to be discharged) Consults called: NS  Dr. Dawley(with names) Admission status: inpatient (inpatient / obs / tele / medical floor / SDU)   Adella Hare MD [facility] Pager  [phone]  If 7PM-7AM, please contact night-coverage [email or web address] Password [facility]  04/07/2020, 8:37 PM

## 2020-04-07 NOTE — ED Notes (Signed)
Report  Attempted RN told will call back.

## 2020-04-07 NOTE — ED Provider Notes (Signed)
3:49 PM Care assumed from Dr. Tyrone Nine.  At time of transfer of care, patient is awaiting screening labs prior to admission to medicine service for further management of back pain and new spinal metastasis that will need intervention tomorrow.  According to previous team, Dr. Reatha Armour, with neurosurgery, would like patient admitted to medicine service and remain n.p.o. at midnight.  Patient will likely need procedure tomorrow on the spine.  We will await screening labs to return and then call for admission.   Clinical Impression: 1. 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 Back pain     Disposition: Admit  This note was prepared with assistance of Dragon voice recognition software. Occasional wrong-word or sound-a-like substitutions may have occurred due to the inherent limitations of voice recognition software.      Shaun Clarke, Gwenyth Allegra, MD 04/07/20 1910

## 2020-04-07 NOTE — ED Notes (Signed)
Provider with patient at this time.

## 2020-04-07 NOTE — ED Notes (Signed)
Report called to [employer] . Denied questions or concerns. Pt taken upstairs w/ NAD.

## 2020-04-07 NOTE — ED Notes (Signed)
Per Rosalie Doctor, lab - clicked off labs were not collected on this patient and still need to be collected.

## 2020-04-07 NOTE — ED Triage Notes (Signed)
Pt here pov with reports of being sent here for NSG consult to see Dawley. Pt reports spinal metastasis.

## 2020-04-07 NOTE — Telephone Encounter (Signed)
Received an urgent phone call from Dr. Warrick Parisian at [facility] for metastatic disease. Mr. Crumby has been scheduled to see Dr. Lorenso Courier on 4/1 at [facility]. Referring office will notify the pt.

## 2020-04-07 NOTE — ED Provider Notes (Signed)
[facility] EMERGENCY DEPARTMENT Provider Note   CSN: [id number] Arrival date & time: 04/07/20  1353     History Chief Complaint  Patient presents with  . Back Pain    Shaun Clarke is a 60 y.o. male.  60 yo M with a cc of thoracic back pain.  Is been going on for a couple months.  Has had some leg weakness with this.  Had been seen by his family doctor for this and had an MRI that was done yesterday.  Was found to have metastatic disease in his spinal canal and was sent to the ED today for acute neurosurgical evaluation.  He denies loss of bowel or bladder denies loss of peritoneal sensation he is able to ambulate but feels like his legs are weak worse on the right than the left.  The history is provided by the patient.  Back Pain Location:  Generalized Quality:  Aching Radiates to:  Does not radiate Pain severity:  No pain Onset quality:  Gradual Duration:  2 months Timing:  Constant Progression:  Worsening Chronicity:  New Relieved by:  Nothing Worsened by:  Nothing Ineffective treatments:  None tried Associated symptoms: abdominal pain   Associated symptoms: no chest pain, no fever and no headaches        Past Medical History:  Diagnosis Date  . GERD (gastroesophageal reflux disease)     Patient Active Problem List   Diagnosis Date Noted  . DIZZINESS 08/27/2007  . OTITIS MEDIA 08/05/2007  . DYSLIPIDEMIA 09/23/2006  . TOBACCO USER 09/23/2006  . GERD 09/23/2006  . PALPITATIONS 09/23/2006  . APPENDECTOMY, HX OF 09/23/2006    Past Surgical History:  Procedure Laterality Date  . APPENDECTOMY    . KNEE ARTHROSCOPY     right knee 3 times  . SHOULDER BIOPSY     right shoulder lipoma removal       Family History  Problem Relation Age of Onset  . Pancreatic cancer Mother   . Diabetes Father   . Anxiety disorder Sister   . Multiple sclerosis Sister   . COPD Maternal Grandfather     Social History   Tobacco Use  . Smoking  status: Current Every Day Smoker    Packs/day: 1.00  . Smokeless tobacco: Never Used  Vaping Use  . Vaping Use: Never used  Substance Use Topics  . Alcohol use: Yes    Alcohol/week: 3.0 standard drinks    Types: 1 Glasses of wine, 2 Cans of beer per week    Comment: per week  . Drug use: Never    Home Medications Prior to Admission medications   Medication Sig Start Date End Date Taking? Authorizing Provider  pantoprazole (PROTONIX) 40 MG tablet Take 2 tablets (80 mg total) by mouth daily. 04/06/20   Dettinger, Fransisca Kaufmann, MD  traMADol (ULTRAM) 50 MG tablet Take 1 tablet (50 mg total) by mouth every 12 (twelve) hours as needed. 04/06/20   Dettinger, Fransisca Kaufmann, MD    Allergies    Naproxen  Review of Systems   Review of Systems  Constitutional: Negative for chills and fever.  HENT: Negative for congestion and facial swelling.   Eyes: Negative for discharge and visual disturbance.  Respiratory: Negative for shortness of breath.   Cardiovascular: Negative for chest pain and palpitations.  Gastrointestinal: Positive for abdominal pain. Negative for diarrhea and vomiting.  Musculoskeletal: Positive for back pain. Negative for arthralgias and myalgias.  Skin: Negative for color change and rash.  Neurological: Negative for tremors, syncope and headaches.  Psychiatric/Behavioral: Negative for confusion and dysphoric mood.    Physical Exam Updated Vital Signs BP 115/66 (BP Location: Right Arm)   Pulse 63   Temp 98.5 F (36.9 C) (Oral)   Resp 18   SpO2 98%   Physical Exam Vitals and nursing note reviewed.  Constitutional:      Appearance: He is well-developed.  HENT:     Head: Normocephalic and atraumatic.  Eyes:     Pupils: Pupils are equal, round, and reactive to light.  Neck:     Vascular: No JVD.  Cardiovascular:     Rate and Rhythm: Normal rate and regular rhythm.     Heart sounds: No murmur heard. No friction rub. No gallop.   Pulmonary:     Effort: No respiratory  distress.     Breath sounds: No wheezing.  Abdominal:     General: There is no distension.     Tenderness: There is no abdominal tenderness. There is no guarding or rebound.  Musculoskeletal:        General: Normal range of motion.     Cervical back: Normal range of motion and neck supple.     Comments: 2-3 beats on clonus on the right.  Brisk reflexes bilaterally.   Skin:    Coloration: Skin is not pale.     Findings: No rash.  Neurological:     Mental Status: He is alert and oriented to person, place, and time.  Psychiatric:        Behavior: Behavior normal.     ED Results / Procedures / Treatments   Labs (all labs ordered are listed, but only abnormal results are displayed) Labs Reviewed  RESP PANEL BY RT-PCR (FLU A&B, COVID) [id number]  CBC WITH DIFFERENTIAL/PLATELET  COMPREHENSIVE METABOLIC PANEL    EKG None  Radiology MR Thoracic Spine Wo Contrast  Result Date: 04/06/2020 CLINICAL DATA:  Back pain and weakness in the legs with difficulty walking. EXAM: MRI THORACIC SPINE WITHOUT CONTRAST TECHNIQUE: Multiplanar, multisequence MR imaging of the thoracic spine was performed. No intravenous contrast was administered. COMPARISON:  Thoracic spine radiographs 02/29/2020 FINDINGS: Alignment:  Normal. Vertebrae: Multiple T1 hypointense, STIR hyperintense lesions throughout the thoracic spine including prominent involvement of the vertebral bodies and posterior elements from T6-T9. Large volume epidural tumor dorsally and laterally in the spinal canal from T5-T8 measuring up to 1 cm in AP thickness and resulting in severe spinal stenosis with complete CSF effacement and moderate cord flattening. Extraosseous tumor anterior and lateral to the vertebral bodies from T6-T10. No fracture. Small Schmorl's nodes along the T6 and T7 inferior endplates. Suspected osseous metastatic disease in the cervical spine on large field of view sagittal counting images (C2 and C3 vertebral bodies and left C4  posterior elements), not well evaluated. Cord:  No cord edema. Paraspinal and other soft tissues: Paraspinal tumor as above. Disc levels: Severe spinal stenosis in the midthoracic spine due to epidural tumor as above. Mild thoracic spondylosis without evidence of significant stenosis elsewhere. IMPRESSION: Extensive osseous metastatic disease including large volume paraspinal and epidural tumor in the mid and lower thoracic spine. Epidural tumor results in severe spinal stenosis and cord flattening without evidence of cord edema. These results will be called to the ordering clinician or representative by the Radiologist Assistant, and communication documented in the PACS or [employer]. Electronically Signed   By: Logan Bores M.D.   On: 04/06/2020 17:38    Procedures Procedures  Medications Ordered in ED Medications - No data to display  ED Course  I have reviewed the triage vital signs and the nursing notes.  Pertinent labs & imaging results that were available during my care of the patient were reviewed by me and considered in my medical decision making (see chart for details).    MDM Rules/Calculators/A&P                          60 yo M with a chief complaint of low back pain.  Going on for a few months.  Had an MRI done as an outpatient that showed significant metastatic disease to this along the spinal canal.  Was sent here for neurosurgical evaluation.  He denies loss of bowel or bladder denies loss of sensation.  He does feel like his legs are weak bilaterally worse on the right than the left.  Will notify neurosurgery.  I discussed the case with Dr. Reatha Armour, neurosurgery recommended a CT scan of the thorax and lab work and admission to the hospitalist.  The patients results and plan were reviewed and discussed.   Any x-rays performed were independently reviewed by myself.   Differential diagnosis were considered with the presenting HPI.  Medications - No data to  display  Vitals:   04/07/20 1402  BP: 115/66  Pulse: 63  Resp: 18  Temp: 98.5 F (36.9 C)  TempSrc: Oral  SpO2: 98%    Final diagnoses:  Back pain  Metastasis of neoplasm to spinal canal [facility])      Final Clinical Impression(s) / ED Diagnoses Final diagnoses:  Back pain  Metastasis of neoplasm to spinal canal [facility])    Rx / DC Orders ED Discharge Orders    None       Deno Etienne, DO 04/09/20 0354

## 2020-04-07 NOTE — ED Notes (Signed)
Patient and wife at bedside updated on plan of care, verbalized understanding of same. Patient appears in no acute distress, requesting meal, meal bag provided (OK per diet order).

## 2020-04-07 NOTE — Consult Note (Signed)
   Providing Compassionate, Quality Care - Together  Neurosurgery Consult  Referring physician: Dr. Linda Hedges Reason for referral: Thoracic epidural mass  Chief Complaint: Back pain and lower extremity weakness  History of Present Illness: This is a 60 year old male with no significant past medical history of cancer, history of hyperlipidemia that has been complaining of increasing GI pain that has not resolved with PPI.  This is been ongoing for a few months, he ultimately had a CT abdomen pelvis which was notable for diverticulosis and gastric wall thickening.  He recently approximately a month and a half ago started complaining of significant thoracic back pain and a few days ago lower extremity weakness.  He was sent for an MRI of the thoracic spine was found to have a significant epidural tumor with cord compression.  He states that his legs have progressively been feeling weaker over the past few days and his balance has been off.  He also complains of some numbness and tingling in his legs.  He denies any bowel or bladder changes.  He does complain of some weight loss 10 pounds over the past month.  He does smoke approximately a pack per day for many years.  He does not drink.  He denies any fevers or IV drug abuse.   Medications: I have reviewed the patient's current medications. Allergies: No Known Allergies  History reviewed. No pertinent family history. Social History:  has no history on file for tobacco use, alcohol use, and drug use.  ROS: 14 point review of systems were obtained which all pertinent positives and negatives are listed in HPI above  Physical Exam:  Vital signs in last 24 hours: Temp:  [98 F (36.7 C)-98.3 F (36.8 C)] 98 F (36.7 C) (07/25 1814) Pulse Rate:  [58-128] 65 (07/26 0746) Resp:  [11-18] 14 (07/26 0217) BP: (138-182)/(65-125) 153/88 (07/26 0700) SpO2:  [91 %-98 %] 96 % (07/26 0746) PE: A&O x3 No acute distress PERRLA EOMI Cranial nerves II  through XII intact BUE 5/5 throughout BLE 4+/5 throughout Bilateral patellar reflexes 3/4 Clonus in right lower extremity, 1-2 beats Decreased sensation to light touch, minimally noted in the lower extremities   Impression/Assessment:  60 year old male with  1.  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  I did discuss with him the possibility of needing surgery for tissue obtainment.  Tentatively he may need a laminectomy for tissue diagnosis and decompression of his spinal cord as he is hyperreflexic. -I answered all of his questions with his wife at the bedside. -Decadron 4 q6   Thank you for allowing me to participate in this patient's care.  Please do not hesitate to call with questions or concerns.   Elwin Sleight, [facility] Cell: [phone]

## 2020-04-08 ENCOUNTER — Encounter (HOSPITAL_COMMUNITY): Payer: Self-pay | Admitting: Internal Medicine

## 2020-04-08 ENCOUNTER — Encounter (HOSPITAL_COMMUNITY)
Admission: EM | Disposition: A | Discharge status: Home / Self Care | Payer: Self-pay | Source: Home / Self Care | Attending: Internal Medicine

## 2020-04-08 ENCOUNTER — Inpatient Hospital Stay (HOSPITAL_COMMUNITY): Payer: 59 | Admitting: Anesthesiology

## 2020-04-08 ENCOUNTER — Inpatient Hospital Stay: Payer: 59 | Admitting: Hematology and Oncology

## 2020-04-08 ENCOUNTER — Inpatient Hospital Stay (HOSPITAL_COMMUNITY): Payer: 59

## 2020-04-08 ENCOUNTER — Inpatient Hospital Stay: Payer: 59

## 2020-04-08 DIAGNOSIS — C7949 Secondary malignant neoplasm of other parts of nervous system: Secondary | ICD-10-CM

## 2020-04-08 DIAGNOSIS — C7951 Secondary malignant neoplasm of bone: Secondary | ICD-10-CM | POA: Diagnosis not present

## 2020-04-08 DIAGNOSIS — K219 Gastro-esophageal reflux disease without esophagitis: Secondary | ICD-10-CM

## 2020-04-08 DIAGNOSIS — Z79899 Other long term (current) drug therapy: Secondary | ICD-10-CM

## 2020-04-08 DIAGNOSIS — F17210 Nicotine dependence, cigarettes, uncomplicated: Secondary | ICD-10-CM

## 2020-04-08 DIAGNOSIS — E785 Hyperlipidemia, unspecified: Secondary | ICD-10-CM

## 2020-04-08 DIAGNOSIS — C801 Malignant (primary) neoplasm, unspecified: Secondary | ICD-10-CM | POA: Diagnosis not present

## 2020-04-08 HISTORY — PX: DECOMPRESSIVE LUMBAR LAMINECTOMY LEVEL 2: SHX5792

## 2020-04-08 LAB — SURGICAL PCR SCREEN
MRSA, PCR: NEGATIVE
Staphylococcus aureus: NEGATIVE

## 2020-04-08 SURGERY — DECOMPRESSIVE LUMBAR LAMINECTOMY LEVEL 2
Anesthesia: General | Site: Back

## 2020-04-08 MED ORDER — DEXAMETHASONE SODIUM PHOSPHATE 10 MG/ML IJ SOLN
INTRAMUSCULAR | Status: AC
  Filled 2020-04-08: qty 1

## 2020-04-08 MED ORDER — FENTANYL CITRATE (PF) 100 MCG/2ML IJ SOLN
25.0000 ug | INTRAMUSCULAR | Status: DC | PRN
  Administered 2020-04-08: 50 ug via INTRAVENOUS

## 2020-04-08 MED ORDER — ACETAMINOPHEN 500 MG PO TABS
ORAL_TABLET | ORAL | Status: AC
  Administered 2020-04-08: 1000 mg via ORAL
  Filled 2020-04-08: qty 2

## 2020-04-08 MED ORDER — BUPIVACAINE LIPOSOME 1.3 % IJ SUSP
INTRAMUSCULAR | Status: AC
  Filled 2020-04-08: qty 20

## 2020-04-08 MED ORDER — ACETAMINOPHEN 500 MG PO TABS: 1000.0000 mg | ORAL_TABLET | ORAL | Status: AC

## 2020-04-08 MED ORDER — FENTANYL CITRATE (PF) 250 MCG/5ML IJ SOLN
INTRAMUSCULAR | Status: AC
  Filled 2020-04-08: qty 5

## 2020-04-08 MED ORDER — FENTANYL CITRATE (PF) 100 MCG/2ML IJ SOLN
INTRAMUSCULAR | Status: AC
  Filled 2020-04-08: qty 2

## 2020-04-08 MED ORDER — SCOPOLAMINE 1 MG/3DAYS TD PT72: 1.0000 | MEDICATED_PATCH | TRANSDERMAL | Status: AC

## 2020-04-08 MED ORDER — SCOPOLAMINE 1 MG/3DAYS TD PT72
MEDICATED_PATCH | TRANSDERMAL | Status: AC
  Administered 2020-04-08: 1.5 mg via TRANSDERMAL
  Filled 2020-04-08: qty 1

## 2020-04-08 MED ORDER — LACTATED RINGERS IV SOLN: INTRAVENOUS | Status: DC

## 2020-04-08 MED ORDER — DIPHENHYDRAMINE HCL 50 MG/ML IJ SOLN
INTRAMUSCULAR | Status: AC
  Filled 2020-04-08: qty 1

## 2020-04-08 MED ORDER — SUGAMMADEX SODIUM 200 MG/2ML IV SOLN
INTRAVENOUS | Status: DC | PRN
  Administered 2020-04-08: 150 mg via INTRAVENOUS

## 2020-04-08 MED ORDER — LACTATED RINGERS IV SOLN: INTRAVENOUS | Status: DC | PRN

## 2020-04-08 MED ORDER — THROMBIN 5000 UNITS EX SOLR
CUTANEOUS | Status: AC
  Filled 2020-04-08: qty 5000

## 2020-04-08 MED ORDER — PROPOFOL 10 MG/ML IV BOLUS
INTRAVENOUS | Status: DC | PRN
  Administered 2020-04-08: 170 mg via INTRAVENOUS

## 2020-04-08 MED ORDER — PROMETHAZINE HCL 25 MG/ML IJ SOLN: 6.2500 mg | INTRAMUSCULAR | Status: DC | PRN

## 2020-04-08 MED ORDER — CHLORHEXIDINE GLUCONATE 0.12 % MT SOLN: 15.0000 mL | Freq: Once | OROMUCOSAL | Status: AC

## 2020-04-08 MED ORDER — BUPIVACAINE LIPOSOME 1.3 % IJ SUSP
INTRAMUSCULAR | Status: DC | PRN
  Administered 2020-04-08: 20 mL

## 2020-04-08 MED ORDER — PROPOFOL 10 MG/ML IV BOLUS
INTRAVENOUS | Status: AC
  Filled 2020-04-08: qty 20

## 2020-04-08 MED ORDER — 0.9 % SODIUM CHLORIDE (POUR BTL) OPTIME
TOPICAL | Status: DC | PRN
  Administered 2020-04-08: 1000 mL

## 2020-04-08 MED ORDER — PHENYLEPHRINE 40 MCG/ML (10ML) SYRINGE FOR IV PUSH (FOR BLOOD PRESSURE SUPPORT)
PREFILLED_SYRINGE | INTRAVENOUS | Status: AC
  Filled 2020-04-08: qty 10

## 2020-04-08 MED ORDER — THROMBIN 5000 UNITS EX SOLR: OROMUCOSAL | Status: DC | PRN

## 2020-04-08 MED ORDER — PHENYLEPHRINE HCL-NACL 10-0.9 MG/250ML-% IV SOLN
INTRAVENOUS | Status: DC | PRN
  Administered 2020-04-08: 20 ug/min via INTRAVENOUS

## 2020-04-08 MED ORDER — MIDAZOLAM HCL 2 MG/2ML IJ SOLN
INTRAMUSCULAR | Status: AC
  Filled 2020-04-08: qty 2

## 2020-04-08 MED ORDER — ROCURONIUM BROMIDE 10 MG/ML (PF) SYRINGE
PREFILLED_SYRINGE | INTRAVENOUS | Status: AC
  Filled 2020-04-08: qty 20

## 2020-04-08 MED ORDER — LIDOCAINE-EPINEPHRINE 1 %-1:100000 IJ SOLN
INTRAMUSCULAR | Status: AC
  Filled 2020-04-08: qty 1

## 2020-04-08 MED ORDER — CHLORHEXIDINE GLUCONATE 0.12 % MT SOLN
OROMUCOSAL | Status: AC
  Administered 2020-04-08: 15 mL via OROMUCOSAL
  Filled 2020-04-08: qty 15

## 2020-04-08 MED ORDER — ONDANSETRON HCL 4 MG/2ML IJ SOLN
INTRAMUSCULAR | Status: AC
  Filled 2020-04-08: qty 2

## 2020-04-08 MED ORDER — CEFAZOLIN SODIUM-DEXTROSE 2-4 GM/100ML-% IV SOLN
2.0000 g | INTRAVENOUS | Status: AC
  Administered 2020-04-08: 2 g via INTRAVENOUS
  Filled 2020-04-08: qty 100

## 2020-04-08 MED ORDER — DEXAMETHASONE SODIUM PHOSPHATE 10 MG/ML IJ SOLN
INTRAMUSCULAR | Status: DC | PRN
  Administered 2020-04-08: 10 mg via INTRAVENOUS

## 2020-04-08 MED ORDER — FENTANYL CITRATE (PF) 250 MCG/5ML IJ SOLN
INTRAMUSCULAR | Status: DC | PRN
  Administered 2020-04-08 (×4): 25 ug via INTRAVENOUS
  Administered 2020-04-08: 100 ug via INTRAVENOUS
  Administered 2020-04-08: 50 ug via INTRAVENOUS

## 2020-04-08 MED ORDER — LIDOCAINE 2% (20 MG/ML) 5 ML SYRINGE
INTRAMUSCULAR | Status: AC
  Filled 2020-04-08: qty 5

## 2020-04-08 MED ORDER — MIDAZOLAM HCL 2 MG/2ML IJ SOLN
INTRAMUSCULAR | Status: DC | PRN
  Administered 2020-04-08: 2 mg via INTRAVENOUS

## 2020-04-08 MED ORDER — ROCURONIUM BROMIDE 10 MG/ML (PF) SYRINGE
PREFILLED_SYRINGE | INTRAVENOUS | Status: DC | PRN
  Administered 2020-04-08: 70 mg via INTRAVENOUS
  Administered 2020-04-08: 30 mg via INTRAVENOUS
  Administered 2020-04-08: 20 mg via INTRAVENOUS

## 2020-04-08 MED ORDER — ONDANSETRON HCL 4 MG/2ML IJ SOLN
INTRAMUSCULAR | Status: DC | PRN
  Administered 2020-04-08: 4 mg via INTRAVENOUS

## 2020-04-08 MED ORDER — DIPHENHYDRAMINE HCL 50 MG/ML IJ SOLN
INTRAMUSCULAR | Status: DC | PRN
  Administered 2020-04-08: 12.5 mg via INTRAVENOUS

## 2020-04-08 MED ORDER — ORAL CARE MOUTH RINSE: 15.0000 mL | Freq: Once | OROMUCOSAL | Status: AC

## 2020-04-08 MED ORDER — PROPOFOL 500 MG/50ML IV EMUL
INTRAVENOUS | Status: DC | PRN
  Administered 2020-04-08 (×2): 100 ug/kg/min via INTRAVENOUS

## 2020-04-08 MED ORDER — LIDOCAINE 2% (20 MG/ML) 5 ML SYRINGE
INTRAMUSCULAR | Status: DC | PRN
  Administered 2020-04-08: 80 mg via INTRAVENOUS

## 2020-04-08 SURGICAL SUPPLY — 81 items
ADH SKN CLS APL DERMABOND .7 (GAUZE/BANDAGES/DRESSINGS) ×1
APL SKNCLS STERI-STRIP NONHPOA (GAUZE/BANDAGES/DRESSINGS)
BAND INSRT 18 STRL LF DISP RB (MISCELLANEOUS) ×2
BAND RUBBER #18 3X1/16 STRL (MISCELLANEOUS) ×6 IMPLANT
BENZOIN TINCTURE PRP APPL 2/3 (GAUZE/BANDAGES/DRESSINGS) IMPLANT
BLADE CLIPPER SURG (BLADE) IMPLANT
BLADE SURG 11 STRL SS (BLADE) ×3 IMPLANT
BUR CARBIDE MATCH 3.0 (BURR) ×3 IMPLANT
BUR CUTTER 7.0 ROUND (BURR) IMPLANT
BUR MATCHSTICK NEURO 3.0 LAGG (BURR) IMPLANT
CANISTER SUCT 3000ML PPV (MISCELLANEOUS) ×3 IMPLANT
CARTRIDGE OIL MAESTRO DRILL (MISCELLANEOUS) IMPLANT
CLOSURE WOUND 1/2 X4 (GAUZE/BANDAGES/DRESSINGS)
CNTNR URN SCR LID CUP LEK RST (MISCELLANEOUS) ×1 IMPLANT
CONT SPEC 4OZ CLIKSEAL STRL BL (MISCELLANEOUS) ×6 IMPLANT
CONT SPEC 4OZ STRL OR WHT (MISCELLANEOUS) ×3
COVER MAYO STAND STRL (DRAPES) ×3 IMPLANT
COVER WAND RF STERILE (DRAPES) IMPLANT
DECANTER SPIKE VIAL GLASS SM (MISCELLANEOUS) IMPLANT
DERMABOND ADVANCED (GAUZE/BANDAGES/DRESSINGS) ×2
DERMABOND ADVANCED .7 DNX12 (GAUZE/BANDAGES/DRESSINGS) ×1 IMPLANT
DIFFUSER DRILL AIR PNEUMATIC (MISCELLANEOUS) IMPLANT
DRAIN JACKSON RD 7FR 3/32 (WOUND CARE) IMPLANT
DRAPE C-ARM 42X72 X-RAY (DRAPES) ×3 IMPLANT
DRAPE HALF SHEET 40X57 (DRAPES) IMPLANT
DRAPE LAPAROTOMY 100X72X124 (DRAPES) ×3 IMPLANT
DRAPE MICROSCOPE LEICA (MISCELLANEOUS) ×3 IMPLANT
DRAPE SURG 17X23 STRL (DRAPES) IMPLANT
DURAPREP 26ML APPLICATOR (WOUND CARE) ×6 IMPLANT
ELECT BLADE INSULATED 4IN (ELECTROSURGICAL) ×3
ELECT REM PT RETURN 9FT ADLT (ELECTROSURGICAL) ×3
ELECTRODE BLADE INSULATED 4IN (ELECTROSURGICAL) ×1 IMPLANT
ELECTRODE REM PT RTRN 9FT ADLT (ELECTROSURGICAL) ×1 IMPLANT
EVACUATOR 1/8 PVC DRAIN (DRAIN) ×3 IMPLANT
GAUZE 4X4 16PLY RFD (DISPOSABLE) IMPLANT
GAUZE SPONGE 4X4 12PLY STRL (GAUZE/BANDAGES/DRESSINGS) IMPLANT
GLOVE BIO SURGEON STRL SZ7 (GLOVE) IMPLANT
GLOVE BIO SURGEON STRL SZ8 (GLOVE) IMPLANT
GLOVE ECLIPSE 8.0 STRL XLNG CF (GLOVE) ×3 IMPLANT
GLOVE EXAM NITRILE XL STR (GLOVE) IMPLANT
GLOVE INDICATOR 8.5 STRL (GLOVE) IMPLANT
GLOVE SRG 8 PF TXTR STRL LF DI (GLOVE) ×5 IMPLANT
GLOVE SURG UNDER POLY LF SZ7 (GLOVE) ×3 IMPLANT
GLOVE SURG UNDER POLY LF SZ8 (GLOVE) ×15
GOWN STRL REUS W/ TWL LRG LVL3 (GOWN DISPOSABLE) ×1 IMPLANT
GOWN STRL REUS W/ TWL XL LVL3 (GOWN DISPOSABLE) ×3 IMPLANT
GOWN STRL REUS W/TWL 2XL LVL3 (GOWN DISPOSABLE) ×6 IMPLANT
GOWN STRL REUS W/TWL LRG LVL3 (GOWN DISPOSABLE) ×3
GOWN STRL REUS W/TWL XL LVL3 (GOWN DISPOSABLE) ×9
KIT BASIN OR (CUSTOM PROCEDURE TRAY) ×3 IMPLANT
KIT POSITION SURG JACKSON T1 (MISCELLANEOUS) ×3 IMPLANT
KIT TURNOVER KIT B (KITS) ×3 IMPLANT
MARKER SKIN DUAL TIP RULER LAB (MISCELLANEOUS) ×3 IMPLANT
NEEDLE HYPO 21X1.5 SAFETY (NEEDLE) ×3 IMPLANT
NEEDLE HYPO 22GX1.5 SAFETY (NEEDLE) ×3 IMPLANT
NEEDLE HYPO 25X1 1.5 SAFETY (NEEDLE) ×3 IMPLANT
NEEDLE SPNL 18GX3.5 QUINCKE PK (NEEDLE) ×3 IMPLANT
NEEDLE SPNL 22GX3.5 QUINCKE BK (NEEDLE) IMPLANT
NS IRRIG 1000ML POUR BTL (IV SOLUTION) ×3 IMPLANT
OIL CARTRIDGE MAESTRO DRILL (MISCELLANEOUS)
PACK LAMINECTOMY NEURO (CUSTOM PROCEDURE TRAY) ×3 IMPLANT
PAD ARMBOARD 7.5X6 YLW CONV (MISCELLANEOUS) ×9 IMPLANT
PATTIES SURGICAL .5 X.5 (GAUZE/BANDAGES/DRESSINGS) IMPLANT
PATTIES SURGICAL .5 X1 (DISPOSABLE) IMPLANT
PATTIES SURGICAL 1X1 (DISPOSABLE) IMPLANT
SPONGE LAP 4X18 RFD (DISPOSABLE) IMPLANT
SPONGE SURGIFOAM ABS GEL 12-7 (HEMOSTASIS) IMPLANT
SPONGE SURGIFOAM ABS GEL SZ50 (HEMOSTASIS) IMPLANT
STRIP CLOSURE SKIN 1/2X4 (GAUZE/BANDAGES/DRESSINGS) IMPLANT
SUT VIC AB 0 CT1 18XCR BRD8 (SUTURE) ×1 IMPLANT
SUT VIC AB 0 CT1 27 (SUTURE)
SUT VIC AB 0 CT1 27XBRD ANBCTR (SUTURE) IMPLANT
SUT VIC AB 0 CT1 8-18 (SUTURE) ×3
SUT VIC AB 2-0 CP2 18 (SUTURE) ×3 IMPLANT
SUT VIC AB 2-0 CT1 18 (SUTURE) IMPLANT
SUT VIC AB 3-0 SH 8-18 (SUTURE) IMPLANT
SUT VICRYL 4-0 PS2 18IN ABS (SUTURE) IMPLANT
SYR 20ML LL LF (SYRINGE) ×3 IMPLANT
TOWEL GREEN STERILE (TOWEL DISPOSABLE) ×3 IMPLANT
TOWEL GREEN STERILE FF (TOWEL DISPOSABLE) ×3 IMPLANT
WATER STERILE IRR 1000ML POUR (IV SOLUTION) ×6 IMPLANT

## 2020-04-08 NOTE — Anesthesia Preprocedure Evaluation (Addendum)
Anesthesia Evaluation  Patient identified by MRN, date of birth, ID band Patient awake    Reviewed: Allergy & Precautions, NPO status , Patient's Chart, lab work & pertinent test results  History of Anesthesia Complications (+) PONV and history of anesthetic complications  Airway Mallampati: I  TM Distance: >3 FB Neck ROM: Full    Dental  (+) Teeth Intact, Dental Advisory Given   Pulmonary Current Smoker and Patient abstained from smoking.,    Pulmonary exam normal breath sounds clear to auscultation       Cardiovascular negative cardio ROS Normal cardiovascular exam Rhythm:Regular Rate:Normal     Neuro/Psych 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  Reproductive/Obstetrics                           Anesthesia Physical Anesthesia Plan  ASA: III  Anesthesia Plan: General   Post-op Pain Management:    Induction: Intravenous  PONV Risk Score and Plan: 2 and Midazolam, Dexamethasone, Ondansetron, Propofol infusion and TIVA  Airway Management Planned: Oral ETT  Additional Equipment:   Intra-op Plan:   Post-operative Plan: Extubation in OR  Informed Consent: I have reviewed the patients History and Physical, chart, labs and discussed the procedure including the risks, benefits and alternatives for the proposed anesthesia with the patient or authorized representative who has indicated his/her understanding and acceptance.     Dental advisory given  Plan Discussed with: CRNA  Anesthesia Plan Comments: (2 large bore PIV)       Anesthesia Quick Evaluation

## 2020-04-08 NOTE — Progress Notes (Signed)
PROGRESS NOTE    Shaun Clarke  ZES:[id number] DOB: 12-03-1960 DOA: 04/07/2020 PCP: Dettinger, Fransisca Kaufmann, MD    Brief Narrative:  60 y/o male admitted to the hospital with thoracic back pain. MRI imaging showed epidural tumor. Seen by neurosurgery with plans for laminectomy. Tissue from surgery will be sent for biopsy. Oncology following.   Assessment & Plan:   Active Problems:   HLD (hyperlipidemia)   Spinal stenosis of thoracic region   Metastatic disease (HCC)   Epidural tumor, T5-T7 with bony mets -unclear primary -seen by neurosurgery with plans for laminectomy which would provide tissue sample for biopsy and decompression of spinal cord -currently on decadron 4mg  q6h -Oncology following, plans outpatient follow up once there is a tissue diagnosis -CT chest shows nodule in LUL  Gastric wall thickening -patient has follow up scheduled with GI for endoscopy -continueon PPI  Possible diverticulitis -noted on CT abdomen, patient does describe some L abd pain -will start on a course of augmentin    DVT prophylaxis: heparin injection 5,000 Units Start: 04/07/20 2200  Code Status: full code Family Communication: discussed with wife and son at the bedside Disposition Plan: Status is: Inpatient  Remains inpatient appropriate because:Ongoing diagnostic testing needed not appropriate for outpatient work up   Dispo: The patient is from: Home              Anticipated d/c is to: TBD              Patient currently is not medically stable to d/c.   Difficult to place patient No         Consultants:   Neurosurgery, Dr. Reatha Armour  Oncology, Dr. Lorenso Courier  Procedures:     Antimicrobials:       Subjective: He reports occasional abdominal pain on the left upper quadrant, no vomiting or fever  Objective: Vitals:   04/07/20 2108 04/07/20 2350 04/08/20 0529 04/08/20 0807  BP: 106/63 101/62 104/68 97/63  Pulse: (!) 54 (!) 51 (!) 54 61  Resp:  17 18 14   Temp:  97.8 F (36.6 C) 98.1 F (36.7 C) 98.7 F (37.1 C) 98.4 F (36.9 C)  TempSrc: Oral  Oral Oral  SpO2: 98% 95% 94% 98%    Intake/Output Summary (Last 24 hours) at 04/08/2020 1043 Last data filed at 04/08/2020 0554 Gross per 24 hour  Intake 654.82 ml  Output 500 ml  Net 154.82 ml   There were no vitals filed for this visit.  Examination:  General exam: Appears calm and comfortable  Respiratory system: Clear to auscultation. Respiratory effort normal. Cardiovascular system: S1 & S2 heard, RRR. No JVD, murmurs, rubs, gallops or clicks. No pedal edema. Gastrointestinal system: Abdomen is nondistended, soft and nontender. No organomegaly or masses felt. Normal bowel sounds heard. Central nervous system: Alert and oriented. No focal neurological deficits. Extremities: Symmetric 5 x 5 power. Skin: No rashes, lesions or ulcers Psychiatry: Judgement and insight appear normal. Mood & affect appropriate.     Data Reviewed: I have personally reviewed following labs and imaging studies  CBC: Recent Labs  Lab 04/07/20 1452  WBC 6.1  NEUTROABS 4.3  HGB 15.0  HCT 45.7  MCV 87.7  PLT 263   Basic Metabolic Panel: Recent Labs  Lab 04/07/20 1452  NA 136  K 4.0  CL 102  CO2 27  GLUCOSE 104*  BUN 10  CREATININE 0.94  CALCIUM 9.1   GFR: Estimated Creatinine Clearance: 82.9 mL/min (by C-G formula based on SCr of  0.94 mg/dL). Liver Function Tests: Recent Labs  Lab 04/07/20 1452  AST 21  ALT 27  ALKPHOS 55  BILITOT 0.8  PROT 6.4*  ALBUMIN 4.0   No results for input(s): LIPASE, AMYLASE in the last 168 hours. No results for input(s): AMMONIA in the last 168 hours. Coagulation Profile: No results for input(s): INR, PROTIME in the last 168 hours. Cardiac Enzymes: No results for input(s): CKTOTAL, CKMB, CKMBINDEX, TROPONINI in the last 168 hours. BNP (last 3 results) No results for input(s): PROBNP in the last 8760 hours. HbA1C: No results for input(s): HGBA1C in the last 72  hours. CBG: No results for input(s): GLUCAP in the last 168 hours. Lipid Profile: No results for input(s): CHOL, HDL, LDLCALC, TRIG, CHOLHDL, LDLDIRECT in the last 72 hours. Thyroid Function Tests: No results for input(s): TSH, T4TOTAL, FREET4, T3FREE, THYROIDAB in the last 72 hours. Anemia Panel: No results for input(s): VITAMINB12, FOLATE, FERRITIN, TIBC, IRON, RETICCTPCT in the last 72 hours. Sepsis Labs: No results for input(s): PROCALCITON, LATICACIDVEN in the last 168 hours.  Recent Results (from the past 240 hour(s))  Resp Panel by RT-PCR (Flu A&B, Covid) Nasopharyngeal Swab     Status: None   Collection Time: 04/07/20  4:00 PM   Specimen: Nasopharyngeal Swab; Nasopharyngeal(NP) swabs in vial transport medium  Result Value Ref Range Status   SARS Coronavirus 2 by RT PCR NEGATIVE NEGATIVE Final    Comment: (NOTE) SARS-CoV-2 target nucleic acids are NOT DETECTED.  The SARS-CoV-2 RNA is generally detectable in upper respiratory specimens during the acute phase of infection. The lowest concentration of SARS-CoV-2 viral copies this assay can detect is 138 copies/mL. A negative result does not preclude SARS-Cov-2 infection and should not be used as the sole basis for treatment or other patient management decisions. A negative result may occur with  improper specimen collection/handling, submission of specimen other than nasopharyngeal swab, presence of viral mutation(s) within the areas targeted by this assay, and inadequate number of viral copies(<138 copies/mL). A negative result must be combined with clinical observations, patient history, and epidemiological information. The expected result is Negative.  Fact Sheet for Patients:  EntrepreneurPulse.com.au  Fact Sheet for Healthcare Providers:  IncredibleEmployment.be  This test is no t yet approved or cleared by the [place] FDA and  has been authorized for detection and/or  diagnosis of SARS-CoV-2 by FDA under an Emergency Use Authorization (EUA). This EUA will remain  in effect (meaning this test can be used) for the duration of the COVID-19 declaration under Section 564(b)(1) of the Act, 21 U.S.C.section 360bbb-3(b)(1), unless the authorization is terminated  or revoked sooner.       Influenza A by PCR NEGATIVE NEGATIVE Final   Influenza B by PCR NEGATIVE NEGATIVE Final    Comment: (NOTE) The Xpert Xpress SARS-CoV-2/FLU/RSV plus assay is intended as an aid in the diagnosis of influenza from Nasopharyngeal swab specimens and should not be used as a sole basis for treatment. Nasal washings and aspirates are unacceptable for Xpert Xpress SARS-CoV-2/FLU/RSV testing.  Fact Sheet for Patients: EntrepreneurPulse.com.au  Fact Sheet for Healthcare Providers: IncredibleEmployment.be  This test is not yet approved or cleared by the [place] FDA and has been authorized for detection and/or diagnosis of SARS-CoV-2 by FDA under an Emergency Use Authorization (EUA). This EUA will remain in effect (meaning this test can be used) for the duration of the COVID-19 declaration under Section 564(b)(1) of the Act, 21 U.S.C. section 360bbb-3(b)(1), unless the authorization is terminated or revoked.  Performed at [facility], [place] [address] [place], [place] [address]   Surgical PCR screen     Status: None   Collection Time: 04/07/20 11:35 PM   Specimen: Nasal Mucosa; Nasal Swab  Result Value Ref Range Status   MRSA, PCR NEGATIVE NEGATIVE Final   Staphylococcus aureus NEGATIVE NEGATIVE Final    Comment: (NOTE) The Xpert SA Assay (FDA approved for NASAL specimens in patients 10 years of age and older), is one component of a comprehensive surveillance program. It is not intended to diagnose infection nor to guide or monitor treatment. Performed at [facility], [place] [address] [place], [place] [address]           Radiology Studies: CT Chest W Contrast  Result Date: 04/07/2020 CLINICAL DATA:  Cancer of unknown primary, osseous metastatic disease on MRI of the thoracic spine EXAM: CT CHEST WITH CONTRAST TECHNIQUE: Multidetector CT imaging of the chest was performed during intravenous contrast administration. CONTRAST:  59mL OMNIPAQUE IOHEXOL 350 MG/ML SOLN COMPARISON:  04/06/2020, 04/01/2020, 03/17/2020 FINDINGS: Cardiovascular: The heart and great vessels are unremarkable without pericardial effusion. Minimal atherosclerosis of the aortic arch. No aneurysm or dissection. Mediastinum/Nodes: No enlarged mediastinal, hilar, or axillary lymph nodes. Thyroid gland, trachea, and esophagus demonstrate no significant findings. Lungs/Pleura: Areas of linear consolidation within the bilateral lower lobes most consistent with atelectasis or scarring. Mild background emphysema, upper lobe predominant, without superimposed airspace disease, effusion, or pneumothorax. Indeterminate 6 mm left upper lobe nodule image 107/7. No other nodules or masses. Upper Abdomen: Persistent mural. Gastric malignancy cannot thickening of the gastric fundus be excluded given the previous MRI findings. There is an enlarged epiphrenic lymph node on the right, measuring 12 mm in short axis reference image 125 of series 3. Musculoskeletal: Paraspinal soft tissue masses are seen from T5 through T10, compatible with metastatic disease seen on earlier MRI. The epidural metastatic disease and diffuse bony metastases seen on MRI are less apparent by CT. There are no acute displaced fractures. Reconstructed images demonstrate no additional findings. IMPRESSION: 1. Thoracic paraspinal soft tissue masses consistent with metastatic disease seen on preceding MRI. Please refer to MRI report detailing thoracic osseous metastases, paraspinal metastatic disease, as well as epidural extension of tumor. 2. Persistent mural thickening of the gastric fundus.  Gastric malignancy cannot be excluded given the thoracic spine findings. Endoscopy may be useful. 3. Indeterminate left upper lobe 6 mm pulmonary nodule. Close attention on follow-up is recommended. 4. Enlarged right epiphrenic lymph node, concerning for metastatic disease. 5. Aortic Atherosclerosis (ICD10-I70.0) and Emphysema (ICD10-J43.9). Electronically Signed   By: Randa Ngo M.D.   On: 04/07/2020 19:29   MR Thoracic Spine Wo Contrast  Result Date: 04/06/2020 CLINICAL DATA:  Back pain and weakness in the legs with difficulty walking. EXAM: MRI THORACIC SPINE WITHOUT CONTRAST TECHNIQUE: Multiplanar, multisequence MR imaging of the thoracic spine was performed. No intravenous contrast was administered. COMPARISON:  Thoracic spine radiographs 02/29/2020 FINDINGS: Alignment:  Normal. Vertebrae: Multiple T1 hypointense, STIR hyperintense lesions throughout the thoracic spine including prominent involvement of the vertebral bodies and posterior elements from T6-T9. Large volume epidural tumor dorsally and laterally in the spinal canal from T5-T8 measuring up to 1 cm in AP thickness and resulting in severe spinal stenosis with complete CSF effacement and moderate cord flattening. Extraosseous tumor anterior and lateral to the vertebral bodies from T6-T10. No fracture. Small Schmorl's nodes along the T6 and T7 inferior endplates. Suspected osseous metastatic disease in the cervical spine  on large field of view sagittal counting images (C2 and C3 vertebral bodies and left C4 posterior elements), not well evaluated. Cord:  No cord edema. Paraspinal and other soft tissues: Paraspinal tumor as above. Disc levels: Severe spinal stenosis in the midthoracic spine due to epidural tumor as above. Mild thoracic spondylosis without evidence of significant stenosis elsewhere. IMPRESSION: Extensive osseous metastatic disease including large volume paraspinal and epidural tumor in the mid and lower thoracic spine. Epidural  tumor results in severe spinal stenosis and cord flattening without evidence of cord edema. These results will be called to the ordering clinician or representative by the Radiologist Assistant, and communication documented in the PACS or [employer]. Electronically Signed   By: Logan Bores M.D.   On: 04/06/2020 17:38        Scheduled Meds: . acetaminophen  650 mg Oral Q6H  . dexamethasone (DECADRON) injection  4 mg Intravenous Q6H  . heparin  5,000 Units Subcutaneous Q8H  . pantoprazole  40 mg Oral BID AC  . sucralfate  1 g Oral BID   Continuous Infusions: . dextrose 5 % and 0.45% NaCl 75 mL/hr at 04/07/20 2110     LOS: 1 day    Time spent: 46mins    Kathie Dike, MD [facility]   If 7PM-7AM, please contact night-coverage [email or web address]  04/08/2020, 10:43 AM

## 2020-04-08 NOTE — Progress Notes (Signed)
Patient arrived to 4NP room 6 via stretcher and walked to bed, wife at bedside.  Patient belongings at bedside only include cell phone and charger, patient's wife took wallet and clothing home.  VSS, CHG bath preformed, MRSA swab taken.  Patient settled with bed in lowest position and call bell within reach. Before patient's wife Alma Friendly left this RN verified her cell phone number in chart, she plans to return in AM on 4/1 but if anything changes overnight this RN would update her.

## 2020-04-08 NOTE — Progress Notes (Signed)
   Providing Compassionate, Quality Care - Together  NEUROSURGERY PROGRESS NOTE   S: Complains of worsening numbness and tingling in his trunk.  Also states that he feels slightly weaker in his legs.  O: EXAM:  BP 97/63 (BP Location: Right Arm)   Pulse 61   Temp 98.4 F (36.9 C) (Oral)   Resp 14   SpO2 98%   Awake, alert, oriented  Speech fluent, appropriate  PERRLA CNs grossly intact  5/5 BUE 4/5 BLE  Sensory to light touch at approximately T7 spinal level  ASSESSMENT:  60 y.o. male with  1.  T5-7 epidural likely metastatic tumor with cord compression  PLAN: -I recommend surgical decompression for tissue diagnosis as well as spinal cord decompression.  I discussed all the risks, benefits and expected outcomes with the patient, his wife and his son at the bedside.  Oncology evaluated the patient and agrees with surgical intervention.  We will add him on for surgery today.    Thank you for allowing me to participate in this patient's care.  Please do not hesitate to call with questions or concerns.   Elwin Sleight, [facility] Cell: [phone]

## 2020-04-08 NOTE — Consult Note (Addendum)
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 Workup/Staging Underway 1) 04/07/2020: presented to the ED with MRI findings concerning for extensive osseous metastatic disease including large volume paraspinal and epidural tumor in the mid and lower thoracic spine 2) 04/08/2020: establish care with Dr. Lorenso Courier while inpatient. Scheduled for laminectomy/biopsy of metastatic spine lesion.   CHIEF COMPLAINTS/PURPOSE OF CONSULTATION:  "Metastatic Lesions to the Spine "  HISTORY OF PRESENTING ILLNESS:  Shaun Clarke 60 y.o. 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   On review of the previous records Shaun Clarke to the emergency department on 04/07/2020 after being evaluated by neurosurgery and was found to have extensive osseous metastatic disease of the spine on MRI.  The scan showed a large volume paraspinal epidural tumor in the mid and lower thoracic spine approximately from T5-T11.  A CT chest abdomen pelvis was performed which showed no clear evidence for primary, though there was some gastric thickening and a enlarged lymph node in the abdomen.  Given these findings he was admitted to [facility] for consideration of neurosurgical intervention.  On exam today Shaun Clarke reports that his symptoms began in approximately October 2021 with abdominal discomfort.  He was evaluated by gastroenterology and was attempted on PPI therapy without much relief.  His symptoms worsened in early February to include back pain.  His p.o. intake declined and he had about 10 pounds of weight loss in that time due to his abdominal pain.  He denies having any issues with fevers,  chills, sweats, nausea, vomiting or diarrhea.  He is a current smoker off and on until he smokes about 1 pack/day.  He is a Games developer by trade.  His family history is markable for pancreatic cancer in his mother.  A full 10 point ROS is listed below.  MEDICAL HISTORY:  Past Medical History:  Diagnosis Date  . GERD (gastroesophageal reflux disease)     SURGICAL HISTORY: Past Surgical History:  Procedure Laterality Date  . APPENDECTOMY    . KNEE ARTHROSCOPY     right knee 3 times  . SHOULDER BIOPSY     right shoulder lipoma removal    SOCIAL HISTORY: Social History   Socioeconomic History  . Marital status: Married    Spouse name: Not on file  . Number of children: 2  . Years of education: Not on file  . Highest education level: Not on file  Occupational History  . Not on file  Tobacco Use  . Smoking status: Current Every Day Smoker    Packs/day: 1.00  . Smokeless tobacco: Never Used  Vaping Use  . Vaping Use: Never used  Substance and Sexual Activity  . Alcohol use: Yes    Alcohol/week: 3.0 standard drinks    Types: 1 Glasses of wine, 2 Cans of beer per week    Comment: per week  . Drug use: Never  . Sexual activity: Yes    Partners: Female  Other Topics Concern  . Not on file  Social History Narrative  . Not on file   Social Determinants of Health   Financial Resource Strain: Not on file  Food Insecurity: Not on file  Transportation Needs: Not on file  Physical Activity: Not  on file  Stress: Not on file  Social Connections: Not on file  Intimate Partner Violence: Not on file    FAMILY HISTORY: Family History  Problem Relation Age of Onset  . Pancreatic cancer Mother   . Diabetes Father   . Anxiety disorder Sister   . Multiple sclerosis Sister   . COPD Maternal Grandfather     ALLERGIES:  is allergic to naproxen.  MEDICATIONS:  Current Facility-Administered Medications  Medication Dose Route Frequency Provider Last Rate Last Admin  .  acetaminophen (TYLENOL) tablet 650 mg  650 mg Oral Q6H Norins, Heinz Knuckles, MD   650 mg at 04/07/20 2329  . dexamethasone (DECADRON) injection 4 mg  4 mg Intravenous Q6H Dawley, Troy C, DO   4 mg at 04/08/20 0522  . dextrose 5 %-0.45 % sodium chloride infusion   Intravenous Continuous Norins, Heinz Knuckles, MD 75 mL/hr at 04/07/20 2110 New Bag at 04/07/20 2110  . heparin injection 5,000 Units  5,000 Units Subcutaneous Q8H Norins, Heinz Knuckles, MD   5,000 Units at 04/07/20 2213  . morphine 2 MG/ML injection 2 mg  2 mg Intravenous Q2H PRN Norins, Heinz Knuckles, MD   2 mg at 04/07/20 2121  . pantoprazole (PROTONIX) EC tablet 40 mg  40 mg Oral BID AC Norins, Heinz Knuckles, MD      . sucralfate (CARAFATE) tablet 1 g  1 g Oral BID Norins, Heinz Knuckles, MD      . zolpidem (AMBIEN) tablet 5 mg  5 mg Oral QHS PRN,MR X 1 Norins, Heinz Knuckles, MD        REVIEW OF SYSTEMS:   Constitutional: ( - ) fevers, ( - )  chills , ( - ) night sweats Eyes: ( - ) blurriness of vision, ( - ) double vision, ( - ) watery eyes Ears, nose, mouth, throat, and face: ( - ) mucositis, ( - ) sore throat Respiratory: ( - ) cough, ( - ) dyspnea, ( - ) wheezes Cardiovascular: ( - ) palpitation, ( - ) chest discomfort, ( - ) lower extremity swelling Gastrointestinal:  ( - ) nausea, ( - ) heartburn, ( - ) change in bowel habits Skin: ( - ) abnormal skin rashes Lymphatics: ( - ) new lymphadenopathy, ( - ) easy bruising Neurological: ( - ) numbness, ( - ) tingling, ( + ) new weaknesses Behavioral/Psych: ( - ) mood change, ( - ) new changes  All other systems were reviewed with the patient and are negative.  PHYSICAL EXAMINATION:  Vitals:   04/08/20 0529 04/08/20 0807  BP: 104/68 97/63  Pulse: (!) 54 61  Resp: 18 14  Temp: 98.7 F (37.1 C) 98.4 F (36.9 C)  SpO2: 94% 98%   There were no vitals filed for this visit.  GENERAL: well appearing middle aged Caucasian male in NAD  SKIN: skin color, texture, turgor are normal, no rashes or significant  lesions EYES: conjunctiva are pink and non-injected, sclera clear LUNGS: clear to auscultation and percussion with normal breathing effort HEART: regular rate & rhythm and no murmurs and no lower extremity edema Musculoskeletal: no cyanosis of digits and no clubbing  PSYCH: alert & oriented x 3, fluent speech NEURO: no focal motor/sensory deficits  LABORATORY DATA:  I have reviewed the data as listed CBC Latest Ref Rng & Units 04/07/2020 02/17/2020 10/07/2008  WBC 4.0 - 10.5 K/uL 6.1 6.1 10.1  Hemoglobin 13.0 - 17.0 g/dL 15.0 16.1 13.2  Hematocrit 39.0 - 52.0 % 45.7  47.1 39.3  Platelets 150 - 400 K/uL 257 268 212    CMP Latest Ref Rng & Units 04/07/2020 02/17/2020 10/07/2008  Glucose 70 - 99 mg/dL 104(H) 91 101(H)  BUN 6 - 20 mg/dL 10 14 5(L)  Creatinine 0.61 - 1.24 mg/dL 0.94 1.02 0.88  Sodium 135 - 145 mmol/L 136 138 139  Potassium 3.5 - 5.1 mmol/L 4.0 4.2 3.5  Chloride 98 - 111 mmol/L 102 103 106  CO2 22 - 32 mmol/L 27 21 27   Calcium 8.9 - 10.3 mg/dL 9.1 9.7 8.6  Total Protein 6.5 - 8.1 g/dL 6.4(L) 7.0 -  Total Bilirubin 0.3 - 1.2 mg/dL 0.8 0.4 -  Alkaline Phos 38 - 126 U/L 55 76 -  AST 15 - 41 U/L 21 17 -  ALT 0 - 44 U/L 27 16 -     RADIOGRAPHIC STUDIES: DG Chest 2 View  Result Date: 03/19/2020 CLINICAL DATA:  Pleurisy, anterior lower chest pain for 4 days EXAM: CHEST - 2 VIEW COMPARISON:  10/06/2008 FINDINGS: The heart size and mediastinal contours are within normal limits. Both lungs are clear. The visualized skeletal structures are unremarkable. IMPRESSION: No active cardiopulmonary disease. Electronically Signed   By: Jerilynn Mages.  Shick M.D.   On: 03/19/2020 14:31   CT Chest W Contrast  Result Date: 04/07/2020 CLINICAL DATA:  Cancer of unknown primary, osseous metastatic disease on MRI of the thoracic spine EXAM: CT CHEST WITH CONTRAST TECHNIQUE: Multidetector CT imaging of the chest was performed during intravenous contrast administration. CONTRAST:  28mL OMNIPAQUE IOHEXOL 350 MG/ML  SOLN COMPARISON:  04/06/2020, 04/01/2020, 03/17/2020 FINDINGS: Cardiovascular: The heart and great vessels are unremarkable without pericardial effusion. Minimal atherosclerosis of the aortic arch. No aneurysm or dissection. Mediastinum/Nodes: No enlarged mediastinal, hilar, or axillary lymph nodes. Thyroid gland, trachea, and esophagus demonstrate no significant findings. Lungs/Pleura: Areas of linear consolidation within the bilateral lower lobes most consistent with atelectasis or scarring. Mild background emphysema, upper lobe predominant, without superimposed airspace disease, effusion, or pneumothorax. Indeterminate 6 mm left upper lobe nodule image 107/7. No other nodules or masses. Upper Abdomen: Persistent mural. Gastric malignancy cannot thickening of the gastric fundus be excluded given the previous MRI findings. There is an enlarged epiphrenic lymph node on the right, measuring 12 mm in short axis reference image 125 of series 3. Musculoskeletal: Paraspinal soft tissue masses are seen from T5 through T10, compatible with metastatic disease seen on earlier MRI. The epidural metastatic disease and diffuse bony metastases seen on MRI are less apparent by CT. There are no acute displaced fractures. Reconstructed images demonstrate no additional findings. IMPRESSION: 1. Thoracic paraspinal soft tissue masses consistent with metastatic disease seen on preceding MRI. Please refer to MRI report detailing thoracic osseous metastases, paraspinal metastatic disease, as well as epidural extension of tumor. 2. Persistent mural thickening of the gastric fundus. Gastric malignancy cannot be excluded given the thoracic spine findings. Endoscopy may be useful. 3. Indeterminate left upper lobe 6 mm pulmonary nodule. Close attention on follow-up is recommended. 4. Enlarged right epiphrenic lymph node, concerning for metastatic disease. 5. Aortic Atherosclerosis (ICD10-I70.0) and Emphysema (ICD10-J43.9). Electronically  Signed   By: Randa Ngo M.D.   On: 04/07/2020 19:29   MR Thoracic Spine Wo Contrast  Result Date: 04/06/2020 CLINICAL DATA:  Back pain and weakness in the legs with difficulty walking. EXAM: MRI THORACIC SPINE WITHOUT CONTRAST TECHNIQUE: Multiplanar, multisequence MR imaging of the thoracic spine was performed. No intravenous contrast was administered. COMPARISON:  Thoracic spine radiographs 02/29/2020  FINDINGS: Alignment:  Normal. Vertebrae: Multiple T1 hypointense, STIR hyperintense lesions throughout the thoracic spine including prominent involvement of the vertebral bodies and posterior elements from T6-T9. Large volume epidural tumor dorsally and laterally in the spinal canal from T5-T8 measuring up to 1 cm in AP thickness and resulting in severe spinal stenosis with complete CSF effacement and moderate cord flattening. Extraosseous tumor anterior and lateral to the vertebral bodies from T6-T10. No fracture. Small Schmorl's nodes along the T6 and T7 inferior endplates. Suspected osseous metastatic disease in the cervical spine on large field of view sagittal counting images (C2 and C3 vertebral bodies and left C4 posterior elements), not well evaluated. Cord:  No cord edema. Paraspinal and other soft tissues: Paraspinal tumor as above. Disc levels: Severe spinal stenosis in the midthoracic spine due to epidural tumor as above. Mild thoracic spondylosis without evidence of significant stenosis elsewhere. IMPRESSION: Extensive osseous metastatic disease including large volume paraspinal and epidural tumor in the mid and lower thoracic spine. Epidural tumor results in severe spinal stenosis and cord flattening without evidence of cord edema. These results will be called to the ordering clinician or representative by the Radiologist Assistant, and communication documented in the PACS or [employer]. Electronically Signed   By: Logan Bores M.D.   On: 04/06/2020 17:38   CT ABDOMEN PELVIS W  CONTRAST  Result Date: 04/02/2020 CLINICAL DATA:  Abdominal pain constipation x4 months. EXAM: CT ABDOMEN AND PELVIS WITH CONTRAST TECHNIQUE: Multidetector CT imaging of the abdomen and pelvis was performed using the standard protocol following bolus administration of intravenous contrast. CONTRAST:  127mL ISOVUE-300 IOPAMIDOL (ISOVUE-300) INJECTION 61% COMPARISON:  None. FINDINGS: Lower chest: Bibasilar atelectasis. Normal size heart. No significant pericardial effusion/thickening. Hepatobiliary: No suspicious hepatic lesions. Gallbladder is unremarkable. No biliary ductal dilation. Pancreas: Unremarkable. No pancreatic ductal dilatation or surrounding inflammatory changes. Spleen: Normal in size without focal abnormality. Adrenals/Urinary Tract: Adrenal glands are unremarkable. Kidneys are without renal calculi, solid lesion, or hydronephrosis. Small hypodense bilateral renal lesions which is technically too small to accurately characterize but favored represent a cyst. Bladder is unremarkable degree of distension. Stomach/Bowel: Orally ingested enteric contrast visualized to the level of small bowel. Mild wall thickening of the gastric antrum which least in part accentuated by under distension. No suspicious small bowel wall thickening or dilation. Appendix is surgically absent per report. Terminal ileum is grossly unremarkable. Extensive left-sided predominant colonic diverticulosis with long segment wall thickening of a predominantly decompressed sigmoid colon, no adjacent inflammation to suggest acute diverticulitis. Vascular/Lymphatic: No significant vascular findings are present. No enlarged abdominal or pelvic lymph nodes. Reproductive: Prostate is unremarkable. Other: No abdominopelvic ascites. Musculoskeletal: Lumbar spondylosis.  No acute osseous abnormality. IMPRESSION: 1. Extensive left-sided predominant colonic diverticulosis without findings to suggest acute diverticulitis, but with apparent long  segment wall thickening of the sigmoid colon which is at least in part accentuated by under distension. Finding which is nonspecific and may be physiologic, represent chronic segmental colitis associated with diverticulosis however underlying lesion not excluded. Recommend correlation with colonoscopy if not recently performed. 2. Mild wall thickening of the gastric antrum which least in part accentuated by under distension. Correlate for gastritis. Electronically Signed   By: Dahlia Bailiff MD   On: 04/02/2020 23:44    ASSESSMENT & PLAN Shaun Clarke 60 y.o. 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   After review the labs, the records, schedule  the patient the findings most consistent with a metastatic spread of an unclear primary malignancy.  CT chest abdomen pelvis shows no clear primary tumor, though there is some thickening of the stomach and a enlarged lymph node in the abdomen.  These findings are highly nonspecific and based off his imaging alone I am not able to determine what type of primary malignancy is causing his metastatic disease to the spine.  Strong suspicion for lymphoma or hematological malignancy, though GI malignancy is certainly a consideration.  Patient does have strong smoking history but no evidence of primary disease of the lung.  At this time our next best move forward is to have a laminectomy and biopsy of the tumor in the spine performed.  We have discussed this with neurosurgery who will be performing this today.  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 Workup/Staging Underway --findings are concerning for metastatic disease of unclear etiology. --Agree with continued Decadron while awaiting neurosurgical intervention. --I have made contact with neurosurgery to discuss plans moving forward.  They plan to perform a laminectomy and biopsy of the  metastatic disease. --Unable to provide prognosis or treatment options until tissue diagnosis is available. --Provided patient with our contact information and basic plan moving forward. --Okay to discharge patient prior to tissue diagnosis is stable from a neurosurgical standpoint after his procedure. --We will continue to follow the patient while he is in house.  All questions were answered. The patient knows to call the clinic with any problems, questions or concerns.  A total of more than 50 were spent on this encounter and over half of that time was spent on counseling and coordination of care as outlined above.   Ledell Peoples, MD Department of Hematology/Oncology [place] at [facility] Phone: [phone] Pager: [phone] Email: [email or web address]  04/08/2020 8:43 AM

## 2020-04-08 NOTE — Transfer of Care (Signed)
Immediate Anesthesia Transfer of Care Note  Patient: Shaun Clarke  Procedure(s) Performed: THORACIC FIVE-THORACIC SEVEN OPEN LAMINECTOMY FOR RESECTION OF EPIDURAL TUMOR  (N/A Back)  Patient Location: PACU  Anesthesia Type:General  Level of Consciousness: drowsy and patient cooperative  Airway & Oxygen Therapy: Patient Spontanous Breathing and Patient connected to face mask oxygen  Post-op Assessment: Report given to RN and Post -op Vital signs reviewed and stable  Post vital signs: Reviewed and stable  Last Vitals:  Vitals Value Taken Time  BP 109/62   Temp    Pulse 62   Resp 16   SpO2 98     Last Pain:  Vitals:   04/08/20 1140  TempSrc:   PainSc: 3          Complications: No complications documented.

## 2020-04-08 NOTE — Anesthesia Procedure Notes (Signed)
Procedure Name: Intubation Date/Time: 04/08/2020 12:56 PM Performed by: Thelma Comp, CRNA Pre-anesthesia Checklist: Patient identified, Emergency Drugs available, Suction available and Patient being monitored Patient Re-evaluated:Patient Re-evaluated prior to induction Oxygen Delivery Method: Circle System Utilized Preoxygenation: Pre-oxygenation with 100% oxygen Induction Type: IV induction Ventilation: Mask ventilation without difficulty Laryngoscope Size: Glidescope and 4 Grade View: Grade I Tube type: Oral Tube size: 7.5 mm Number of attempts: 1 Airway Equipment and Method: Stylet and Oral airway Placement Confirmation: ETT inserted through vocal cords under direct vision,  positive ETCO2 and breath sounds checked- equal and bilateral Secured at: 25 cm Tube secured with: Tape Dental Injury: Teeth and Oropharynx as per pre-operative assessment  Comments: DL x 1 with Mac 4, grade 3 view, glidescope used x 1 and successful intubation by EMT Student Lemar Livings.

## 2020-04-08 NOTE — Op Note (Signed)
Providing Compassionate, Quality Care - Together  Date of service 04/08/2020  PREOP DIAGNOSIS:  1. T5-T7 epidural tumor with cord compression, unknown primary  POSTOP DIAGNOSIS: Same  PROCEDURE: 1. Open partial bilateral T4, T5 bilateral, T6 bilateral, T7 bilateral laminectomy for resection of epidural tumor 2. Intraoperative use of microscope for microdissection 3. Intraoperative use of fluoroscopy, less than 1 hour  SURGEON: Dr. Pieter Partridge C. Izzabell Klasen, DO  ASSISTANT: Dr. 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 male that had progressive lower extremity weakness over the last few days with numbness and tingling that was worsening.  He also complained of difficulty walking due to his balance.  He had been complaining of gastric issues for a few months and then started having neck back pain.  An MRI of the thoracic spine was obtained which showed extensive metastatic disease to multiple levels of bone as well as a large dorsal epidural tumor from T5-T7 with cord compression and severe stenosis.  Due to this I brought him to the emergency room for further work-up, CT chest did not show any definitive tumor and discussion with oncology will be elected to offer a T5-7 open laminectomy for resection of tumor and to obtain tissue diagnosis.  All risks, benefits, expected outcomes were discussed with the patient, his son and his wife at bedside and they agreed to proceed.  PROCEDURE IN DETAIL: The patient was brought to the operating room. After induction of general anesthesia, the patient was positioned on the operative table in the prone position. All pressure points were meticulously padded. Skin incision was then marked out and prepped and draped in the usual sterile  fashion.  Using AP fluoroscopy, the T5-T7 pedicles were localized.  Midline incision was made with a 10 blade.  Soft tissue dissection was performed down to the thoracic fascia with Bovie cautery.  Subperiosteal dissection was performed to expose bilateral T4, T5, T6, T7 lamina and pars.  Deep tissue tract retractors were placed.  Again an AP fluoroscopy was obtained to confirm the T5-T7 levels.  The microscope was sterilely draped and brought into the field for the remainder of the case.  Leksell was used to remove the spinous processes of T5, T6, T7.  Using a high-speed drill a bilateral T5, T6-T7 laminectomies were performed.  A small inferior T4 bilateral laminectomy was also performed to obtain a normal epidural level for dissection purposes.  During the laminectomy there was multiple loculations of tumor within the lamina at each level.  The ligamentum flavum was identified at each level and there was significant epidural tumor involving the ligamentum flavum as well as under the lamina.  The tumor was grayish and somewhat vascularized.  At this point using microcurette's a epidural plane was dissected and carefully with micro curettes and Rogers, epidural tumor was removed and sent partially for frozen specimen.  The preliminary result was metastatic of unknown origin.  They perform microdissection at T5, T6 and T7 to gently piecemeal resect the dorsal and lateral portions of the tumor in the lateral recesses.  There was significant stenosis at this time.  There were a few regions especially on the left inferiorly at T7 where the tumor was somewhat adherent to the dura.  Once this was completed the area appeared adequately decompressed and was pulsatile.  Using [employer]  hooks, the lateral recesses were felt bilaterally and there was no significant stenosis felt.  Epidural hemostasis was achieved with bipolar cautery, bone wax and Surgifoam.  Wound was copiously irrigated and noted to be excellently  hemostatic.  A medium Hemovac drain was tunneled laterally.  This was placed in the epidural space.  The wound was closed in layers with 0 Vicryl for the fascia, 2-0 Vicryl for the dermis and 3-0 Vicryl sutures.  Skin was closed with Dermabond.  Sterile dressing was applied.  At the end of the case all sponge, needle, and instrument counts were correct. The patient was then transferred to the stretcher, extubated, and taken to the post-anesthesia care unit in stable hemodynamic condition.

## 2020-04-08 NOTE — Progress Notes (Signed)
   Providing Compassionate, Quality Care - Together  NEUROSURGERY PROGRESS NOTE   S: Patient seen and examined in PACU, no new complaints  O: EXAM:  BP 105/63 (BP Location: Left Arm)   Pulse (!) 59   Temp 97.9 F (36.6 C)   Resp 12   SpO2 95%   Awake, alert Speech fluent PERRLA Face symmetric CNs grossly intact  5/5 BUE 4+/5 BLE  Some subjective right leg numbness Incision clean dry and intact  ASSESSMENT:  60 y.o. male with  1.  Thoracic T5-7 epidural tumor, status post T5-7 laminectomy for decompression of spinal cord, resection of tumor  PLAN: -PT/OT -Monitor Hemovac -Pain control -DVT prophylaxis -Discussed with oncology, will obtain MRI thoracic spine with and without contrast for postop evaluation.  At this time MRI brain is not needed pending pathology   Thank you for allowing me to participate in this patient's care.  Please do not hesitate to call with questions or concerns.   Elwin Sleight, [facility] Cell: [phone]

## 2020-04-09 ENCOUNTER — Inpatient Hospital Stay (HOSPITAL_COMMUNITY): Payer: 59

## 2020-04-09 LAB — CANCER ANTIGEN 19-9: CA 19-9: <2 U/mL (ref 0–35)

## 2020-04-09 LAB — CEA: CEA: 4 ng/mL (ref 0.0–4.7)

## 2020-04-09 MED ORDER — DIPHENHYDRAMINE HCL 50 MG/ML IJ SOLN: 50.0000 mg | Freq: Once | INTRAMUSCULAR | Status: AC

## 2020-04-09 MED ORDER — PREDNISONE 50 MG PO TABS
50.0000 mg | ORAL_TABLET | Freq: Four times a day (QID) | ORAL | Status: AC
  Administered 2020-04-09 – 2020-04-10 (×3): 50 mg via ORAL
  Filled 2020-04-09 (×3): qty 1

## 2020-04-09 MED ORDER — OXYCODONE-ACETAMINOPHEN 5-325 MG PO TABS
1.0000 | ORAL_TABLET | ORAL | Status: DC | PRN
  Administered 2020-04-10 – 2020-04-11 (×4): 1 via ORAL
  Filled 2020-04-09 (×5): qty 1

## 2020-04-09 MED ORDER — DIPHENHYDRAMINE HCL 25 MG PO CAPS
50.0000 mg | ORAL_CAPSULE | Freq: Once | ORAL | Status: AC
  Administered 2020-04-10: 50 mg via ORAL
  Filled 2020-04-09: qty 2

## 2020-04-09 NOTE — Evaluation (Signed)
Physical Therapy Evaluation Patient Details Name: Shaun Clarke MRN: [id number] DOB: 1960/04/05 Today's Date: 04/09/2020   History of Present Illness  Pt is a 60 y.o. male who presented 3/31 with GI pain that did not resolve with PPI plus carafate medication. He came to CT abd/pelvis which was notable for diverticulosis and gastric wall thickening. He came to MRI which revealed soft tissue lesions in the thoracic spine region with encroachment into the spinal canal space causing severe spine stenosis and cord flattening w/o edema. 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 S/p T5-7 laminectomy for decompression of spinal cord and resection of tumor 4/1. PMH: HLD and GERD.  Clinical Impression  Pt presents with condition above and deficits mentioned below, see PT Problem List. 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 Currently, pt is limited in activity tolerance, mobility, and balance primarily due to pain. He displays symmetrical and intact/WNL bil lower extremity sensation, dynamic proprioception, strength, and coordination. Pt will likely continue to improve as pain dissipates. Pt benefits from utilizing a RW with gait or a rail with stairs to relieve some pressure and pain in the back. Pt educated to use his RW he has at home initially until his balance and pain improve. Educated pt and provided handout on spinal precautions and proper techniques for bed mobility, transfers, and car transfers to maintain them, with pt demonstrating compliance throughout. Will continue to follow acutely to further advance his independence and safety with mobility. No follow-up PT needs necessary at this time.    Follow Up Recommendations No PT follow up    Equipment Recommendations  Other (comment) (shower chair)    Recommendations for Other Services       Precautions / Restrictions Precautions Precautions:  Back Precaution Booklet Issued: Yes (comment) Precaution Comments: Reviewed spinal precautions, BLT Required Braces or Orthoses:  (No brace needed per secure chat with neurosurgery NP) Restrictions Weight Bearing Restrictions: No      Mobility  Bed Mobility Overal bed mobility: Needs Assistance Bed Mobility: Rolling;Sidelying to Sit;Sit to Sidelying Rolling: Min guard Sidelying to sit: HOB elevated;Min guard     Sit to sidelying: Min guard General bed mobility comments: Pt educated on log roll technique and to manage legs and trunk simultaneously with sidelying <> sit transitions, with pt demonstrating understanding and compliance with spinal precautions. Min guard for safety and cues. Pt with HOB slightly elevated and using rails.    Transfers Overall transfer level: Needs assistance Equipment used: Rolling walker (2 wheeled) Transfers: Sit to/from Stand Sit to Stand: Min guard         General transfer comment: Extra time to power up to stand due to pain, but pt able to come to stand safely without LOB. Cues for hand placement.  Ambulation/Gait Ambulation/Gait assistance: Min Gaffer (Feet): 400 Feet Assistive device: Rolling walker (2 wheeled) Gait Pattern/deviations: Step-through pattern;Decreased stride length;Narrow base of support;Trunk flexed Gait velocity: reduced Gait velocity interpretation: <1.8 ft/sec, indicate of risk for recurrent falls General Gait Details: Pt ambulating slowly with RW and shoulders elevated, cues to relax with pt reporting improved pain. No LOB or trunk sway, min guard-supervision for safety.  Stairs Stairs: Yes Stairs assistance: Min guard Stair Management: One rail Right;Alternating pattern;Step to pattern;Forwards Number of Stairs: 10 General stair comments: Ascends with reciprocal pattern and descends with step-to pattern with use of R rail without LOB, min guard for safety.  Wheelchair  Mobility     Modified Rankin (Stroke Patients Only)       Balance Overall balance assessment: Mild deficits observed, not formally tested                                           Pertinent Vitals/Pain Pain Assessment: 0-10 Pain Score: 7  Pain Location: surgical site; back Pain Descriptors / Indicators: Aching;Guarding;Grimacing;Operative site guarding Pain Intervention(s): Limited activity within patient's tolerance;Monitored during session;Repositioned    Home Living Family/patient expects to be discharged to:: Private residence Living Arrangements: Spouse/significant other Available Help at Discharge: Family;Available 24 hours/day Type of Home: House Home Access: Stairs to enter   [employer] of Steps: 1 Home Layout: One level Home Equipment: Walker - 2 wheels;Hand held shower head;Wheelchair - manual      Prior Function Level of Independence: Independent         Comments: 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 Pt independent with functional mobility and ADLs.     Hand Dominance   Dominant Hand: Right    Extremity/Trunk Assessment   Upper Extremity Assessment Upper Extremity Assessment: Defer to OT evaluation    Lower Extremity Assessment Lower Extremity Assessment: Overall WFL for tasks assessed (no numbness/tingling; intact dynamic proprioception; MMT scores of 4+ to 5 bil; coordination intact)    Cervical / Trunk Assessment Cervical / Trunk Assessment: Other exceptions Cervical / Trunk Exceptions: spinal surgery  Communication   Communication: No difficulties  Cognition Arousal/Alertness: Awake/alert Behavior During Therapy: WFL for tasks assessed/performed Overall Cognitive Status: Within Functional Limits for tasks assessed                                        General Comments General comments (skin integrity, edema, etc.): Educated on safe car transfers and use of RW at home and how to set RW to his height.     Exercises     Assessment/Plan    PT Assessment Patient needs continued PT services  PT Problem List Decreased range of motion;Decreased balance;Decreased mobility;Decreased knowledge of use of DME;Decreased knowledge of precautions;Pain       PT Treatment Interventions DME instruction;Gait training;Stair training;Functional mobility training;Therapeutic activities;Therapeutic exercise;Balance training;Neuromuscular re-education;Patient/family education    PT Goals (Current goals can be found in the Care Plan section)  Acute Rehab PT Goals Patient Stated Goal: to go home PT Goal Formulation: With patient/family Time For Goal Achievement: 04/23/20 Potential to Achieve Goals: Good    Frequency Min 5X/week   Barriers to discharge        Co-evaluation               AM-PAC PT "6 Clicks" Mobility  Outcome Measure Help needed turning from your back to your side while in a flat bed without using bedrails?: A Little Help needed moving from lying on your back to sitting on the side of a flat bed without using bedrails?: A Little Help needed moving to and from a bed to a chair (including a wheelchair)?: A Little Help needed standing up from a chair using your arms (e.g., wheelchair or bedside chair)?: A Little Help needed to walk in hospital room?: A Little Help needed climbing 3-5 steps with a railing? : A Little 6 Click Score: 18    End of Session Equipment Utilized  During Treatment: Gait belt Activity Tolerance: Patient tolerated treatment well Patient left: in bed;with call bell/phone within reach;with family/visitor present Nurse Communication: Mobility status;Other (comment) (bed alarm not on as pt is at very low risk for falls) PT Visit Diagnosis: Unsteadiness on feet (R26.81);Other abnormalities of gait and mobility (R26.89);Difficulty in walking, not elsewhere classified (R26.2);Pain Pain - Right/Left:  (back) Pain - part of body:  (back)    Time: 2446-9507 PT Time  Calculation (min) (ACUTE ONLY): 29 min   Charges:   PT Evaluation $PT Eval Low Complexity: 1 Low PT Treatments $Therapeutic Activity: 8-22 mins        Moishe Spice, PT, DPT Acute Rehabilitation Services  Pager: [phone] Office: [phone]   Orvan Falconer 04/09/2020, 1:16 PM

## 2020-04-09 NOTE — Progress Notes (Signed)
PROGRESS NOTE    Shaun Clarke  IOE:[id number] DOB: 11/20/1960 DOA: 04/07/2020 PCP: Dettinger, Fransisca Kaufmann, MD    Brief Narrative:  60 y/o male admitted to the hospital with thoracic back pain. MRI imaging showed epidural tumor. Seen by neurosurgery with plans for laminectomy. Tissue from surgery will be sent for biopsy. Oncology following.   Assessment & Plan:   Active Problems:   HLD (hyperlipidemia)   Spinal stenosis of thoracic region   Metastatic disease (HCC)   Epidural tumor, T5-T7 with bony mets -unclear primary -seen by neurosurgery and underwent laminectomy on 4/1 -follow up tissue pathology -steroids discontinued -Oncology following, plans outpatient follow up once there is a tissue diagnosis -CT chest shows nodule in LUL -plan for MRI T spine today -PT/OT -further post-op care per neurosurgery  Gastric wall thickening -patient has follow up scheduled with GI for endoscopy -continue on PPI  Possible diverticulitis -noted on CT abdomen -patient denies any pain, fever, vomiting or any other symptoms -would hold off on antibiotics for now and observe    DVT prophylaxis: SCDs  Code Status: full code Family Communication: discussed with wife and son at the bedside Disposition Plan: Status is: Inpatient  Remains inpatient appropriate because:Ongoing diagnostic testing needed not appropriate for outpatient work up   Dispo: The patient is from: Home              Anticipated d/c is to: Home              Patient currently is not medically stable to d/c.   Difficult to place patient No         Consultants:   Neurosurgery, Dr. Reatha Armour  Oncology, Dr. Lorenso Courier  Procedures:     Antimicrobials:       Subjective: Reports that he does have some soreness at surgical sites, but other back pain has resolved. No abdominal pain, no vomiting, no fevers  Objective: Vitals:   04/08/20 2324 04/09/20 0413 04/09/20 0729 04/09/20 1206  BP: 100/62 101/61  103/66 (!) 97/59  Pulse: 72 62 (!) 56 (!) 52  Resp: 15 18 16 15   Temp: 98.7 F (37.1 C) 98.8 F (37.1 C) 98.6 F (37 C) 98.9 F (37.2 C)  TempSrc: Oral Oral Oral Oral  SpO2: 93% 96% 98% 93%    Intake/Output Summary (Last 24 hours) at 04/09/2020 1401 Last data filed at 04/09/2020 0608 Gross per 24 hour  Intake 1200 ml  Output 1025 ml  Net 175 ml   There were no vitals filed for this visit.  Examination:  General exam: Alert, awake, oriented x 3 Respiratory system: Clear to auscultation. Respiratory effort normal. Cardiovascular system:RRR. No murmurs, rubs, gallops. Gastrointestinal system: Abdomen is nondistended, soft and nontender. No organomegaly or masses felt. Normal bowel sounds heard. Central nervous system: Alert and oriented. No focal neurological deficits. Extremities: No C/C/E, +pedal pulses Skin: No rashes, lesions or ulcers Psychiatry: Judgement and insight appear normal. Mood & affect appropriate.      Data Reviewed: I have personally reviewed following labs and imaging studies  CBC: Recent Labs  Lab 04/07/20 1452  WBC 6.1  NEUTROABS 4.3  HGB 15.0  HCT 45.7  MCV 87.7  PLT 182   Basic Metabolic Panel: Recent Labs  Lab 04/07/20 1452  NA 136  K 4.0  CL 102  CO2 27  GLUCOSE 104*  BUN 10  CREATININE 0.94  CALCIUM 9.1   GFR: Estimated Creatinine Clearance: 82.9 mL/min (by C-G formula based on SCr of  0.94 mg/dL). Liver Function Tests: Recent Labs  Lab 04/07/20 1452  AST 21  ALT 27  ALKPHOS 55  BILITOT 0.8  PROT 6.4*  ALBUMIN 4.0   No results for input(s): LIPASE, AMYLASE in the last 168 hours. No results for input(s): AMMONIA in the last 168 hours. Coagulation Profile: No results for input(s): INR, PROTIME in the last 168 hours. Cardiac Enzymes: No results for input(s): CKTOTAL, CKMB, CKMBINDEX, TROPONINI in the last 168 hours. BNP (last 3 results) No results for input(s): PROBNP in the last 8760 hours. HbA1C: No results for  input(s): HGBA1C in the last 72 hours. CBG: No results for input(s): GLUCAP in the last 168 hours. Lipid Profile: No results for input(s): CHOL, HDL, LDLCALC, TRIG, CHOLHDL, LDLDIRECT in the last 72 hours. Thyroid Function Tests: No results for input(s): TSH, T4TOTAL, FREET4, T3FREE, THYROIDAB in the last 72 hours. Anemia Panel: No results for input(s): VITAMINB12, FOLATE, FERRITIN, TIBC, IRON, RETICCTPCT in the last 72 hours. Sepsis Labs: No results for input(s): PROCALCITON, LATICACIDVEN in the last 168 hours.  Recent Results (from the past 240 hour(s))  Resp Panel by RT-PCR (Flu A&B, Covid) Nasopharyngeal Swab     Status: None   Collection Time: 04/07/20  4:00 PM   Specimen: Nasopharyngeal Swab; Nasopharyngeal(NP) swabs in vial transport medium  Result Value Ref Range Status   SARS Coronavirus 2 by RT PCR NEGATIVE NEGATIVE Final    Comment: (NOTE) SARS-CoV-2 target nucleic acids are NOT DETECTED.  The SARS-CoV-2 RNA is generally detectable in upper respiratory specimens during the acute phase of infection. The lowest concentration of SARS-CoV-2 viral copies this assay can detect is 138 copies/mL. A negative result does not preclude SARS-Cov-2 infection and should not be used as the sole basis for treatment or other patient management decisions. A negative result may occur with  improper specimen collection/handling, submission of specimen other than nasopharyngeal swab, presence of viral mutation(s) within the areas targeted by this assay, and inadequate number of viral copies(<138 copies/mL). A negative result must be combined with clinical observations, patient history, and epidemiological information. The expected result is Negative.  Fact Sheet for Patients:  EntrepreneurPulse.com.au  Fact Sheet for Healthcare Providers:  IncredibleEmployment.be  This test is no t yet approved or cleared by the [place] FDA and  has been  authorized for detection and/or diagnosis of SARS-CoV-2 by FDA under an Emergency Use Authorization (EUA). This EUA will remain  in effect (meaning this test can be used) for the duration of the COVID-19 declaration under Section 564(b)(1) of the Act, 21 U.S.C.section 360bbb-3(b)(1), unless the authorization is terminated  or revoked sooner.       Influenza A by PCR NEGATIVE NEGATIVE Final   Influenza B by PCR NEGATIVE NEGATIVE Final    Comment: (NOTE) The Xpert Xpress SARS-CoV-2/FLU/RSV plus assay is intended as an aid in the diagnosis of influenza from Nasopharyngeal swab specimens and should not be used as a sole basis for treatment. Nasal washings and aspirates are unacceptable for Xpert Xpress SARS-CoV-2/FLU/RSV testing.  Fact Sheet for Patients: EntrepreneurPulse.com.au  Fact Sheet for Healthcare Providers: IncredibleEmployment.be  This test is not yet approved or cleared by the [place] FDA and has been authorized for detection and/or diagnosis of SARS-CoV-2 by FDA under an Emergency Use Authorization (EUA). This EUA will remain in effect (meaning this test can be used) for the duration of the COVID-19 declaration under Section 564(b)(1) of the Act, 21 U.S.C. section 360bbb-3(b)(1), unless the authorization is terminated or revoked.  Performed at [facility], [place] [address] [place], [place] [address]   Surgical PCR screen     Status: None   Collection Time: 04/07/20 11:35 PM   Specimen: Nasal Mucosa; Nasal Swab  Result Value Ref Range Status   MRSA, PCR NEGATIVE NEGATIVE Final   Staphylococcus aureus NEGATIVE NEGATIVE Final    Comment: (NOTE) The Xpert SA Assay (FDA approved for NASAL specimens in patients 5 years of age and older), is one component of a comprehensive surveillance program. It is not intended to diagnose infection nor to guide or monitor treatment. Performed at [facility], [place] [address], [place], [place] [address]          Radiology Studies: CT Chest W Contrast  Result Date: 04/07/2020 CLINICAL DATA:  Cancer of unknown primary, osseous metastatic disease on MRI of the thoracic spine EXAM: CT CHEST WITH CONTRAST TECHNIQUE: Multidetector CT imaging of the chest was performed during intravenous contrast administration. CONTRAST:  36mL OMNIPAQUE IOHEXOL 350 MG/ML SOLN COMPARISON:  04/06/2020, 04/01/2020, 03/17/2020 FINDINGS: Cardiovascular: The heart and great vessels are unremarkable without pericardial effusion. Minimal atherosclerosis of the aortic arch. No aneurysm or dissection. Mediastinum/Nodes: No enlarged mediastinal, hilar, or axillary lymph nodes. Thyroid gland, trachea, and esophagus demonstrate no significant findings. Lungs/Pleura: Areas of linear consolidation within the bilateral lower lobes most consistent with atelectasis or scarring. Mild background emphysema, upper lobe predominant, without superimposed airspace disease, effusion, or pneumothorax. Indeterminate 6 mm left upper lobe nodule image 107/7. No other nodules or masses. Upper Abdomen: Persistent mural. Gastric malignancy cannot thickening of the gastric fundus be excluded given the previous MRI findings. There is an enlarged epiphrenic lymph node on the right, measuring 12 mm in short axis reference image 125 of series 3. Musculoskeletal: Paraspinal soft tissue masses are seen from T5 through T10, compatible with metastatic disease seen on earlier MRI. The epidural metastatic disease and diffuse bony metastases seen on MRI are less apparent by CT. There are no acute displaced fractures. Reconstructed images demonstrate no additional findings. IMPRESSION: 1. Thoracic paraspinal soft tissue masses consistent with metastatic disease seen on preceding MRI. Please refer to MRI report detailing thoracic osseous metastases, paraspinal metastatic disease, as well as epidural extension of tumor. 2. Persistent mural  thickening of the gastric fundus. Gastric malignancy cannot be excluded given the thoracic spine findings. Endoscopy may be useful. 3. Indeterminate left upper lobe 6 mm pulmonary nodule. Close attention on follow-up is recommended. 4. Enlarged right epiphrenic lymph node, concerning for metastatic disease. 5. Aortic Atherosclerosis (ICD10-I70.0) and Emphysema (ICD10-J43.9). Electronically Signed   By: Randa Ngo M.D.   On: 04/07/2020 19:29   DG Thoracic Spine 1 View  Result Date: 04/08/2020 CLINICAL DATA:  Surgery, elective. Additional history provided: T5-7 laminectomy. EXAM: OPERATIVE THORACIC SPINE single VIEW. COMPARISON:  Chest CT 04/07/2020.  Thoracic spine MRI 04/06/2020. FINDINGS: A single AP intraoperative fluoroscopic image of the thoracic spine is submitted. The image demonstrates a metallic site marker projecting over the C5 vertebral body. Overlying retractors. Partially visualized support tubes. IMPRESSION: Single AP intraoperative fluoroscopic image of the thoracic spine with metallic site marker projecting over the T5 vertebral body, as described. Electronically Signed   By: Kellie Simmering DO   On: 04/08/2020 15:38   DG C-Arm 1-60 Min  Result Date: 04/08/2020 CLINICAL DATA:  Surgery, elective. Additional history provided: T5-7 laminectomy. EXAM: OPERATIVE THORACIC SPINE single VIEW. COMPARISON:  Chest CT 04/07/2020.  Thoracic spine MRI 04/06/2020. FINDINGS: A single AP  intraoperative fluoroscopic image of the thoracic spine is submitted. The image demonstrates a metallic site marker projecting over the C5 vertebral body. Overlying retractors. Partially visualized support tubes. IMPRESSION: Single AP intraoperative fluoroscopic image of the thoracic spine with metallic site marker projecting over the T5 vertebral body, as described. Electronically Signed   By: Kellie Simmering DO   On: 04/08/2020 15:38        Scheduled Meds: . acetaminophen  650 mg Oral Q6H  . pantoprazole  40 mg Oral  BID AC  . scopolamine  1 patch Transdermal STAT  . sucralfate  1 g Oral BID   Continuous Infusions:    LOS: 2 days    Time spent: 41mins    Kathie Dike, MD [facility]   If 7PM-7AM, please contact night-coverage [email or web address]  04/09/2020, 2:01 PM

## 2020-04-09 NOTE — Progress Notes (Signed)
NEUROSURGERY PROGRESS NOTE  Doing well. Complains of appropriate back soreness but overall really pleased with his pain level. All of his preop pain is gone. Strength has greatly improved No numbness, tingling or weakness Good strength and sensation Incision CDI  Temp:  [97.2 F (36.2 C)-98.8 F (37.1 C)] 98.6 F (37 C) (04/02 0729) Pulse Rate:  [56-84] 56 (04/02 0729) Resp:  [12-18] 16 (04/02 0729) BP: (100-109)/(54-68) 103/66 (04/02 0729) SpO2:  [90 %-98 %] 98 % (04/02 0729) FiO2 (%):  [21 %] 21 % (04/01 1123)  Plan: OOB today and work with therapy. MRI brain and t-spine pending.   Eleonore Chiquito, NP 04/09/2020 9:04 AM

## 2020-04-10 ENCOUNTER — Inpatient Hospital Stay (HOSPITAL_COMMUNITY): Payer: 59

## 2020-04-10 MED ORDER — SODIUM CHLORIDE 0.9 % IV SOLN: INTRAVENOUS | Status: DC

## 2020-04-10 MED ORDER — GADOBUTROL 1 MMOL/ML IV SOLN
7.5000 mL | Freq: Once | INTRAVENOUS | Status: AC | PRN
  Administered 2020-04-10: 7.5 mL via INTRAVENOUS

## 2020-04-10 NOTE — Progress Notes (Signed)
OT Cancellation Note  Patient Details Name: Shaun Clarke MRN: [id number] DOB: 1960/01/21   Cancelled Treatment:    Reason Eval/Treat Not Completed: Patient at procedure or test/ unavailable (MRI. Will return as schedule allows.)  [place], OTR/L Acute Rehab Pager: [phone] Office: [phone] 04/10/2020, 12:24 PM

## 2020-04-10 NOTE — Progress Notes (Signed)
NEUROSURGERY PROGRESS NOTE  Doing really well, feels much better than preop. Has no preop symptoms anymore, just some back soreness from the incision. Awaiting MRI's this afternoon. Therapy again today and likely home tomorrow. Drain removed.   Temp:  [98 F (36.7 C)-99 F (37.2 C)] 98.1 F (36.7 C) (04/03 0755) Pulse Rate:  [52-60] 60 (04/03 0755) Resp:  [15-20] 20 (04/03 0755) BP: (88-101)/(53-69) 96/63 (04/03 0833) SpO2:  [93 %-98 %] 93 % (04/03 0755)   Shaun Chiquito, NP 04/10/2020 11:28 AM

## 2020-04-10 NOTE — Progress Notes (Signed)
PT Cancellation Note  Patient Details Name: Shaun Clarke MRN: [id number] DOB: 01-27-1960   Cancelled Treatment:    Reason Eval/Treat Not Completed: Pain limiting ability to participate. RN reporting pt is in a lot of pain and requesting for PT to return once MRI is completed later this morning. Will follow-up as time permits.   Moishe Spice, PT, DPT Acute Rehabilitation Services  Pager: [phone] Office: [place] 04/10/2020, 9:38 AM

## 2020-04-10 NOTE — Progress Notes (Signed)
Physical Therapy Treatment Patient Details Name: Shaun Clarke MRN: [id number] DOB: [birth year] Today's Date: 04/10/2020    History of Present Illness Pt is a 60 y.o. male who presented 3/31 with GI pain that did not resolve with PPI plus carafate medication. He came to CT abd/pelvis which was notable for diverticulosis and gastric wall thickening. He came to MRI which revealed soft tissue lesions in the thoracic spine region with encroachment into the spinal canal space causing severe spine stenosis and cord flattening w/o edema. 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 S/p T5-7 laminectomy for decompression of spinal cord and resection of tumor 4/1. 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 PMH: HLD and GERD.    PT Comments    Pt demonstrating good recall and compliance with spinal precautions. However, he did have increased pain and slightly decreased stability this date as he displayed one minor LOB laterally to the L during gait with minA to recover. Otherwise, he progressed to descending stairs in a reciprocal pattern rather than step-to, but displayed decreased eccentric strength in his R quads muscle compared to his L. Will continue to follow acutely. Current recommendations remain appropriate.    Follow Up Recommendations  No PT follow up     Equipment Recommendations  Other (comment) (shower chair)    Recommendations for Other Services       Precautions / Restrictions Precautions Precautions: Back Precaution Booklet Issued: Yes (comment) Precaution Comments: Reviewed spinal precautions, BLT, 3/3 recall without cues Required Braces or Orthoses:  (No brace needed per secure chat with neurosurgery NP) Restrictions Weight Bearing Restrictions: No    Mobility  Bed Mobility Overal bed mobility: Needs Assistance Bed Mobility: Rolling;Sit to Sidelying Rolling: Min guard       Sit  to sidelying: Min guard General bed mobility comments: Pt educated on log roll technique and to manage legs and trunk simultaneously with sit > sidelying transitions, with pt demonstrating understanding and compliance with spinal precautions. Min guard for safety and cues.    Transfers Overall transfer level: Needs assistance Equipment used: Rolling walker (2 wheeled) Transfers: Sit to/from Stand Sit to Stand: Min guard         General transfer comment: Extra time to power up to stand due to pain, but pt able to come to stand safely without LOB. Pt appropriately pushing up from chair with 1 hand for safety when coming to stand and reaching back before sitting.  Ambulation/Gait Ambulation/Gait assistance: Min guard;Min assist Gait Distance (Feet): 400 Feet Assistive device: Rolling walker (2 wheeled) Gait Pattern/deviations: Step-through pattern;Decreased stride length;Narrow base of support;Trunk flexed Gait velocity: reduced Gait velocity interpretation: <1.8 ft/sec, indicate of risk for recurrent falls General Gait Details: Pt ambulating slowly with RW and shoulders elevated, cues to relax. When pt lowered shoulders 1x he had a L lateral LOB spontaneously, needing minA to ensure safety and recovery. Otherwise, min guard for safety.   Stairs Stairs: Yes Stairs assistance: Min guard Stair Management: One rail Right;Alternating pattern;Forwards Number of Stairs: 10 General stair comments: Ascends and descends with reciprocal pattern with use of R rail without LOB, min guard for safety. Extra time descending and less control when leading down with L foot, making R quad control the descent.   Wheelchair Mobility    Modified Rankin (Stroke Patients Only)       Balance Overall balance assessment: Mild deficits observed, not formally tested  Cognition Arousal/Alertness: Awake/alert Behavior During Therapy: WFL for tasks  assessed/performed Overall Cognitive Status: Within Functional Limits for tasks assessed                                        Exercises      General Comments        Pertinent Vitals/Pain Pain Assessment: Faces Faces Pain Scale: Hurts even more Pain Location: surgical site; back Pain Descriptors / Indicators: Aching;Guarding;Grimacing;Operative site guarding Pain Intervention(s): Limited activity within patient's tolerance;Monitored during session;Repositioned    Home Living                      Prior Function            PT Goals (current goals can now be found in the care plan section) Acute Rehab PT Goals Patient Stated Goal: to go home PT Goal Formulation: With patient/family Time For Goal Achievement: 04/23/20 Potential to Achieve Goals: Good Progress towards PT goals: Progressing toward goals    Frequency    Min 5X/week      PT Plan Current plan remains appropriate    Co-evaluation              AM-PAC PT "6 Clicks" Mobility   Outcome Measure  Help needed turning from your back to your side while in a flat bed without using bedrails?: A Little Help needed moving from lying on your back to sitting on the side of a flat bed without using bedrails?: A Little Help needed moving to and from a bed to a chair (including a wheelchair)?: A Little Help needed standing up from a chair using your arms (e.g., wheelchair or bedside chair)?: A Little Help needed to walk in hospital room?: A Little Help needed climbing 3-5 steps with a railing? : A Little 6 Click Score: 18    End of Session Equipment Utilized During Treatment: Gait belt Activity Tolerance: Patient tolerated treatment well Patient left: in bed;with call bell/phone within reach;with family/visitor present   PT Visit Diagnosis: Unsteadiness on feet (R26.81);Other abnormalities of gait and mobility (R26.89);Difficulty in walking, not elsewhere classified (R26.2);Pain Pain  - Right/Left:  (back) Pain - part of body:  (back)     Time: 1448-1856 PT Time Calculation (min) (ACUTE ONLY): 12 min  Charges:  $Gait Training: 8-22 mins                     Moishe Spice, PT, DPT Acute Rehabilitation Services  Pager: [phone] Office: [place] 04/10/2020, 4:26 PM

## 2020-04-10 NOTE — Evaluation (Signed)
Occupational Therapy Evaluation Patient Details Name: Shaun Clarke MRN: [id number] DOB: 1960-04-04 Today's Date: 04/10/2020    History of Present Illness 60 y.o. male who presented 3/31 with GI pain that did not resolve with PPI plus carafate medication. He came to CT abd/pelvis which was notable for diverticulosis and gastric wall thickening. He came to MRI which revealed soft tissue lesions in the thoracic spine region with encroachment into the spinal canal space causing severe spine stenosis and cord flattening w/o edema. 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 S/p T5-7 laminectomy for decompression of spinal cord and resection of tumor 4/1. 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 PMH: HLD and GERD.   Clinical Impression   PTA, pt was living with his wife and was independent. Currently, pt requires Min Guard for ADLs and functional mobility using RW. Initiating education on back precautions for comfort and LB ADLs. Pt donning pants with Min Guard A. Pt would benefit from further acute OT to facilitate safe dc. Recommend dc home once medically stable per physician.    Follow Up Recommendations  No OT follow up    Equipment Recommendations  3 in 1 bedside commode    Recommendations for Other Services PT consult     Precautions / Restrictions Precautions Precautions: Back Precaution Booklet Issued: Yes (comment) Precaution Comments: Reviewed spinal precautions, BLT, 3/3 recall without cues Required Braces or Orthoses:  (No brace needed per secure chat with neurosurgery NP) Restrictions Weight Bearing Restrictions: No      Mobility Bed Mobility Overal bed mobility: Needs Assistance Bed Mobility: Rolling;Sit to Sidelying Rolling: Min guard Sidelying to sit: Min guard     Sit to sidelying: Min guard General bed mobility comments: Min Guard A for log roll technique     Transfers Overall transfer level: Needs assistance Equipment used: Rolling walker (2 wheeled) Transfers: Sit to/from Stand Sit to Stand: Min guard         General transfer comment: Min guard A for safety    Balance Overall balance assessment: Mild deficits observed, not formally tested                                         ADL either performed or assessed with clinical judgement   ADL Overall ADL's : Needs assistance/impaired Eating/Feeding: Set up;Sitting   Grooming: Set up;Supervision/safety;Sitting   Upper Body Bathing: Set up;Supervision/ safety;Sitting   Lower Body Bathing: Min guard;Sit to/from stand   Upper Body Dressing : Supervision/safety;Set up;Sitting   Lower Body Dressing: Min guard;Sit to/from stand   Toilet Transfer: Min guard;Ambulation;RW           Functional mobility during ADLs: Min guard;Rolling walker General ADL Comments: Pt performing at Carrier guard A level. Initating education on compensatory tehcniques for LB ADLs. Pt demonstrating good balance but reporting increased soreness today     Vision         Perception     Praxis      Pertinent Vitals/Pain Pain Assessment: Faces Faces Pain Scale: Hurts even more Pain Location: surgical site; back Pain Descriptors / Indicators: Aching;Guarding;Grimacing;Operative site guarding Pain Intervention(s): Monitored during session;Limited activity within patient's tolerance;Repositioned     Hand Dominance Right   Extremity/Trunk Assessment Upper Extremity Assessment Upper Extremity Assessment: Overall WFL for tasks assessed   Lower Extremity Assessment  Lower Extremity Assessment: Defer to PT evaluation   Cervical / Trunk Assessment Cervical / Trunk Assessment: Other exceptions Cervical / Trunk Exceptions: spinal surgery   Communication Communication Communication: No difficulties   Cognition Arousal/Alertness: Awake/alert Behavior During Therapy: WFL for tasks  assessed/performed Overall Cognitive Status: Within Functional Limits for tasks assessed                                     General Comments  Wife and son present throughout session    Exercises     Shoulder Instructions      Home Living Family/patient expects to be discharged to:: Private residence Living Arrangements: Spouse/significant other Available Help at Discharge: Family;Available 24 hours/day Type of Home: House Home Access: Stairs to enter [employer] of Steps: 1   Home Layout: One level     Bathroom Shower/Tub: Occupational psychologist: Handicapped height     Home Equipment: Environmental consultant - 2 wheels;Hand held shower head;Wheelchair - manual          Prior Functioning/Environment Level of Independence: Independent        Comments: 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 Pt independent with functional mobility and ADLs.        OT Problem List: Decreased strength;Impaired balance (sitting and/or standing);Decreased knowledge of use of DME or AE;Decreased knowledge of precautions;Pain      OT Treatment/Interventions: Self-care/ADL training;Therapeutic exercise;Energy conservation;DME and/or AE instruction;Therapeutic activities;Patient/family education    OT Goals(Current goals can be found in the care plan section) Acute Rehab OT Goals Patient Stated Goal: to go home OT Goal Formulation: With patient Time For Goal Achievement: 04/24/20 Potential to Achieve Goals: Good  OT Frequency: Min 2X/week   Barriers to D/C:            Co-evaluation              AM-PAC OT "6 Clicks" Daily Activity     Outcome Measure Help from another person eating meals?: None Help from another person taking care of personal grooming?: A Little Help from another person toileting, which includes using toliet, bedpan, or urinal?: A Little Help from another person bathing (including washing, rinsing, drying)?: A Little Help from another  person to put on and taking off regular upper body clothing?: A Little Help from another person to put on and taking off regular lower body clothing?: A Little 6 Click Score: 19   End of Session Equipment Utilized During Treatment: Rolling walker Nurse Communication: Mobility status  Activity Tolerance: Patient tolerated treatment well Patient left: in chair;with call bell/phone within reach;with family/visitor present  OT Visit Diagnosis: Other abnormalities of gait and mobility (R26.89);Muscle weakness (generalized) (M62.81);Unsteadiness on feet (R26.81);Pain Pain - part of body:  (Back)                Time: 0388-8280 OT Time Calculation (min): 18 min Charges:  OT General Charges $OT Visit: 1 Visit OT Evaluation $OT Eval Low Complexity: 1 Low  Sasan Wilkie MSOT, OTR/L Acute Rehab Pager: [phone] Office: [place] 04/10/2020, 4:44 PM

## 2020-04-10 NOTE — Progress Notes (Signed)
PROGRESS NOTE    Shaun Clarke  SKA:[id number] DOB: 1960-06-25 DOA: 04/07/2020 PCP: Dettinger, Fransisca Kaufmann, MD    Brief Narrative:  60 y/o male admitted to the hospital with thoracic back pain. MRI imaging showed epidural tumor. Seen by neurosurgery with plans for laminectomy. Tissue from surgery will be sent for biopsy. Oncology following.   Assessment & Plan:   Active Problems:   HLD (hyperlipidemia)   Spinal stenosis of thoracic region   Metastatic disease (HCC)   Epidural tumor, T5-T7 with bony mets -unclear primary -seen by neurosurgery and underwent laminectomy on 4/1 -follow up tissue pathology -steroids discontinued -Oncology following, plans outpatient follow up once there is a tissue diagnosis -CT chest shows nodule in LUL -MRI T spine and brain performed today that were unremarkable. Osseous mets to the calvarium and upper C spine. Resolved cord compression -due to prior allergy to MRI contrast, patient was premedicated with prednisone -PT/OT evaluated patient and no PT/OT recommended on discharge -further post-op care per neurosurgery  Gastric wall thickening -patient has follow up scheduled with GI for endoscopy -continue on PPI  Dizziness -check orthostatics -IV fluids overnight  Possible diverticulitis -noted on CT abdomen -patient denies any pain, fever, vomiting or any other symptoms -would hold off on antibiotics for now and observe    DVT prophylaxis: SCDs  Code Status: full code Family Communication: discussed with wife and son at the bedside Disposition Plan: Status is: Inpatient  Remains inpatient appropriate because:Ongoing diagnostic testing needed not appropriate for outpatient work up   Dispo: The patient is from: Home              Anticipated d/c is to: Home              Patient currently is not medically stable to d/c.   Difficult to place patient No         Consultants:   Neurosurgery, Dr. Reatha Armour  Oncology, Dr.  Lorenso Courier  Procedures:   4/1 T5-T7 laminectomy  Antimicrobials:       Subjective: Feels more sore in his back today. He feels as though he loses his balance when walking  Objective: Vitals:   04/10/20 0400 04/10/20 0755 04/10/20 0830 04/10/20 0833  BP: (!) 98/58 (!) 88/53 97/63 96/63   Pulse: (!) 52 60    Resp: 18 20    Temp: 98 F (36.7 C) 98.1 F (36.7 C)    TempSrc: Oral     SpO2: 98% 93%      Intake/Output Summary (Last 24 hours) at 04/10/2020 1717 Last data filed at 04/10/2020 1400 Gross per 24 hour  Intake 120 ml  Output 150 ml  Net -30 ml   There were no vitals filed for this visit.  Examination:  General exam: Alert, awake, oriented x 3 Respiratory system: Clear to auscultation. Respiratory effort normal. Cardiovascular system:RRR. No murmurs, rubs, gallops. Gastrointestinal system: Abdomen is nondistended, soft and nontender. No organomegaly or masses felt. Normal bowel sounds heard. Central nervous system: Alert and oriented. No focal neurological deficits. Extremities: No C/C/E, +pedal pulses Skin: No rashes, lesions or ulcers Psychiatry: Judgement and insight appear normal. Mood & affect appropriate.      Data Reviewed: I have personally reviewed following labs and imaging studies  CBC: Recent Labs  Lab 04/07/20 1452  WBC 6.1  NEUTROABS 4.3  HGB 15.0  HCT 45.7  MCV 87.7  PLT 203   Basic Metabolic Panel: Recent Labs  Lab 04/07/20 1452  NA 136  K 4.0  CL 102  CO2 27  GLUCOSE 104*  BUN 10  CREATININE 0.94  CALCIUM 9.1   GFR: Estimated Creatinine Clearance: 82.9 mL/min (by C-G formula based on SCr of 0.94 mg/dL). Liver Function Tests: Recent Labs  Lab 04/07/20 1452  AST 21  ALT 27  ALKPHOS 55  BILITOT 0.8  PROT 6.4*  ALBUMIN 4.0   No results for input(s): LIPASE, AMYLASE in the last 168 hours. No results for input(s): AMMONIA in the last 168 hours. Coagulation Profile: No results for input(s): INR, PROTIME in the last 168  hours. Cardiac Enzymes: No results for input(s): CKTOTAL, CKMB, CKMBINDEX, TROPONINI in the last 168 hours. BNP (last 3 results) No results for input(s): PROBNP in the last 8760 hours. HbA1C: No results for input(s): HGBA1C in the last 72 hours. CBG: No results for input(s): GLUCAP in the last 168 hours. Lipid Profile: No results for input(s): CHOL, HDL, LDLCALC, TRIG, CHOLHDL, LDLDIRECT in the last 72 hours. Thyroid Function Tests: No results for input(s): TSH, T4TOTAL, FREET4, T3FREE, THYROIDAB in the last 72 hours. Anemia Panel: No results for input(s): VITAMINB12, FOLATE, FERRITIN, TIBC, IRON, RETICCTPCT in the last 72 hours. Sepsis Labs: No results for input(s): PROCALCITON, LATICACIDVEN in the last 168 hours.  Recent Results (from the past 240 hour(s))  Resp Panel by RT-PCR (Flu A&B, Covid) Nasopharyngeal Swab     Status: None   Collection Time: 04/07/20  4:00 PM   Specimen: Nasopharyngeal Swab; Nasopharyngeal(NP) swabs in vial transport medium  Result Value Ref Range Status   SARS Coronavirus 2 by RT PCR NEGATIVE NEGATIVE Final    Comment: (NOTE) SARS-CoV-2 target nucleic acids are NOT DETECTED.  The SARS-CoV-2 RNA is generally detectable in upper respiratory specimens during the acute phase of infection. The lowest concentration of SARS-CoV-2 viral copies this assay can detect is 138 copies/mL. A negative result does not preclude SARS-Cov-2 infection and should not be used as the sole basis for treatment or other patient management decisions. A negative result may occur with  improper specimen collection/handling, submission of specimen other than nasopharyngeal swab, presence of viral mutation(s) within the areas targeted by this assay, and inadequate number of viral copies(<138 copies/mL). A negative result must be combined with clinical observations, patient history, and epidemiological information. The expected result is Negative.  Fact Sheet for Patients:   EntrepreneurPulse.com.au  Fact Sheet for Healthcare Providers:  IncredibleEmployment.be  This test is no t yet approved or cleared by the [place] FDA and  has been authorized for detection and/or diagnosis of SARS-CoV-2 by FDA under an Emergency Use Authorization (EUA). This EUA will remain  in effect (meaning this test can be used) for the duration of the COVID-19 declaration under Section 564(b)(1) of the Act, 21 U.S.C.section 360bbb-3(b)(1), unless the authorization is terminated  or revoked sooner.       Influenza A by PCR NEGATIVE NEGATIVE Final   Influenza B by PCR NEGATIVE NEGATIVE Final    Comment: (NOTE) The Xpert Xpress SARS-CoV-2/FLU/RSV plus assay is intended as an aid in the diagnosis of influenza from Nasopharyngeal swab specimens and should not be used as a sole basis for treatment. Nasal washings and aspirates are unacceptable for Xpert Xpress SARS-CoV-2/FLU/RSV testing.  Fact Sheet for Patients: EntrepreneurPulse.com.au  Fact Sheet for Healthcare Providers: IncredibleEmployment.be  This test is not yet approved or cleared by the [place] FDA and has been authorized for detection and/or diagnosis of SARS-CoV-2 by FDA under an Emergency Use Authorization (EUA). This EUA will remain  in effect (meaning this test can be used) for the duration of the COVID-19 declaration under Section 564(b)(1) of the Act, 21 U.S.C. section 360bbb-3(b)(1), unless the authorization is terminated or revoked.  Performed at [facility], [place] [address], [place], [place] [address]   Surgical PCR screen     Status: None   Collection Time: 04/07/20 11:35 PM   Specimen: Nasal Mucosa; Nasal Swab  Result Value Ref Range Status   MRSA, PCR NEGATIVE NEGATIVE Final   Staphylococcus aureus NEGATIVE NEGATIVE Final    Comment: (NOTE) The Xpert SA Assay (FDA approved for NASAL specimens in patients  56 years of age and older), is one component of a comprehensive surveillance program. It is not intended to diagnose infection nor to guide or monitor treatment. Performed at [facility], [place] [address] [place], [place] [address]          Radiology Studies: MR BRAIN W WO CONTRAST  Result Date: 04/10/2020 CLINICAL DATA:  Epidural tumor in the midthoracic spine with osseous metastases. Unknown primary. Patient does have a lung nodule. EXAM: MRI HEAD WITHOUT AND WITH CONTRAST TECHNIQUE: Multiplanar, multiecho pulse sequences of the brain and surrounding structures were obtained without and with intravenous contrast. CONTRAST:  7.15mL GADAVIST GADOBUTROL 1 MMOL/ML IV SOLN COMPARISON:  MRI of the thoracic spine without and with contrast 04/10/2020 FINDINGS: Brain: No acute infarct, hemorrhage, or mass lesion is present. No significant white matter lesions are present. The ventricles are of normal size. No significant extraaxial fluid collection is present. Basal ganglia are within normal limits. The internal auditory canals are within normal limits. The brainstem and cerebellum are within normal limits. Postcontrast images demonstrate no pathologic enhancement. Vascular: Flow is present in the major intracranial arteries. Skull and upper cervical spine: Osseous metastases are present at C2 and C3. There is heterogeneous enhancement of the calvarium consistent with osseous metastases. Spinal canal is within normal limits. Empty and enlarged sella noted. Sinuses/Orbits: The paranasal sinuses and mastoid air cells are clear. The globes and orbits are within normal limits. IMPRESSION: 1. Normal MRI appearance of the brain. 2. Osseous metastases to the calvarium and upper cervical spine. 3. Empty and enlarged sella. This is nonspecific, but can be seen in the setting of idiopathic intracranial hypertension. Electronically Signed   By: San Morelle M.D.   On: 04/10/2020 13:49   MR THORACIC SPINE  W WO CONTRAST  Result Date: 04/10/2020 CLINICAL DATA:  Osseous metastatic disease with epidural tumor status post thoracic laminectomy 2 days ago. EXAM: MRI THORACIC WITHOUT AND WITH CONTRAST TECHNIQUE: Multiplanar and multiecho pulse sequences of the thoracic spine were obtained without and with intravenous contrast. CONTRAST:  7.29mL GADAVIST GADOBUTROL 1 MMOL/ML IV SOLN COMPARISON:  Thoracic spine radiographs 04/08/2020. Chest CT 04/07/2020 and thoracic MRI 04/06/2020. FINDINGS: Alignment:  Normal. Vertebrae: Patient has undergone interval wide decompressive laminectomies at T5, T6 and T7. Multifocal osseous metastatic disease is again noted, most prominent within the T6, T7, T8 and T9 vertebral bodies. No evidence of pathologic fracture. Cord: Resolution of previously demonstrated cord compression in the midthoracic spine status post decompressive laminectomy and epidural tumor resection. No abnormal intradural enhancement. Paraspinal and other soft tissues: Multifocal paraspinal tumor again noted, most prominent on the left from T6 through T9. Associated metastases within the ribs. As above, interval debridement of posterior epidural tumor in the spinal canal. No new paraspinal abnormalities. Increased atelectasis at both lung bases. Disc levels: No significant disc pathology. No residual cord deformity  or significant foraminal compromise. IMPRESSION: 1. Interval decompressive laminectomies from T5 through T7 with epidural tumor resection and resolved cord compression. 2. No demonstrated postoperative complications. 3. Otherwise stable multifocal osseous metastatic disease with paraspinal tumor asymmetric to the left. No acute findings. Electronically Signed   By: Richardean Sale M.D.   On: 04/10/2020 13:49        Scheduled Meds: . acetaminophen  650 mg Oral Q6H  . pantoprazole  40 mg Oral BID AC  . scopolamine  1 patch Transdermal STAT  . sucralfate  1 g Oral BID   Continuous Infusions: . sodium  chloride       LOS: 3 days    Time spent: 11mins    Kathie Dike, MD [facility]   If 7PM-7AM, please contact night-coverage [email or web address]  04/10/2020, 5:17 PM

## 2020-04-10 NOTE — Anesthesia Postprocedure Evaluation (Signed)
Anesthesia Post Note  Patient: Shaun Clarke  Procedure(s) Performed: THORACIC FIVE-THORACIC SEVEN OPEN LAMINECTOMY FOR RESECTION OF EPIDURAL TUMOR  (N/A Back)     Patient location during evaluation: PACU Anesthesia Type: General Level of consciousness: awake and alert Pain management: pain level controlled Vital Signs Assessment: post-procedure vital signs reviewed and stable Respiratory status: spontaneous breathing, nonlabored ventilation, respiratory function stable and patient connected to nasal cannula oxygen Cardiovascular status: blood pressure returned to baseline and stable Postop Assessment: no apparent nausea or vomiting Anesthetic complications: no   No complications documented.  Last Vitals:  Vitals:   04/10/20 1732 04/10/20 1736  BP: (!) 105/59 (!) 93/52  Pulse: 77 70  Resp:    Temp:    SpO2:  92%    Last Pain:  Vitals:   04/10/20 1747  TempSrc:   PainSc: 7                  [name]

## 2020-04-11 ENCOUNTER — Telehealth: Payer: Self-pay

## 2020-04-11 ENCOUNTER — Encounter (HOSPITAL_COMMUNITY): Payer: Self-pay | Admitting: Neurological Surgery

## 2020-04-11 LAB — CBC
HCT: 36.8 % — ABNORMAL LOW (ref 39.0–52.0)
Hemoglobin: 12.3 g/dL — ABNORMAL LOW (ref 13.0–17.0)
MCH: 29.7 pg (ref 26.0–34.0)
MCHC: 33.4 g/dL (ref 30.0–36.0)
MCV: 88.9 fL (ref 80.0–100.0)
Platelets: 221 10*3/uL (ref 150–400)
RBC: 4.14 MIL/uL — ABNORMAL LOW (ref 4.22–5.81)
RDW: 15.4 % (ref 11.5–15.5)
WBC: 13.7 10*3/uL — ABNORMAL HIGH (ref 4.0–10.5)
nRBC: 0 % (ref 0.0–0.2)

## 2020-04-11 LAB — MULTIPLE MYELOMA PANEL, SERUM
Albumin SerPl Elph-Mcnc: 3.9 g/dL (ref 2.9–4.4)
Albumin/Glob SerPl: 1.6 (ref 0.7–1.7)
Alpha 1: 0.2 g/dL (ref 0.0–0.4)
Alpha2 Glob SerPl Elph-Mcnc: 0.8 g/dL (ref 0.4–1.0)
B-Globulin SerPl Elph-Mcnc: 0.9 g/dL (ref 0.7–1.3)
Gamma Glob SerPl Elph-Mcnc: 0.7 g/dL (ref 0.4–1.8)
Globulin, Total: 2.6 g/dL (ref 2.2–3.9)
IgA: 149 mg/dL (ref 90–386)
IgG (Immunoglobin G), Serum: 670 mg/dL (ref 603–1613)
IgM (Immunoglobulin M), Srm: 151 mg/dL (ref 20–172)
Total Protein ELP: 6.5 g/dL (ref 6.0–8.5)

## 2020-04-11 LAB — BASIC METABOLIC PANEL
Anion gap: 6 (ref 5–15)
BUN: 15 mg/dL (ref 6–20)
CO2: 25 mmol/L (ref 22–32)
Calcium: 8.6 mg/dL — ABNORMAL LOW (ref 8.9–10.3)
Chloride: 108 mmol/L (ref 98–111)
Creatinine, Ser: 0.88 mg/dL (ref 0.61–1.24)
GFR, Estimated: >60 mL/min (ref >60)
Glucose, Bld: 117 mg/dL — ABNORMAL HIGH (ref 70–99)
Potassium: 3.9 mmol/L (ref 3.5–5.1)
Sodium: 139 mmol/L (ref 135–145)

## 2020-04-11 LAB — KAPPA/LAMBDA LIGHT CHAINS
Kappa free light chain: 11.4 mg/L (ref 3.3–19.4)
Kappa, lambda light chain ratio: 1.27 (ref 0.26–1.65)
Lambda free light chains: 9 mg/L (ref 5.7–26.3)

## 2020-04-11 MED ORDER — ONDANSETRON HCL 4 MG PO TABS: 4.0000 mg | ORAL_TABLET | Freq: Four times a day (QID) | ORAL | Status: DC | PRN

## 2020-04-11 MED ORDER — PANTOPRAZOLE SODIUM 40 MG PO TBEC: 40.0000 mg | DELAYED_RELEASE_TABLET | Freq: Two times a day (BID) | ORAL | Status: DC

## 2020-04-11 MED ORDER — OXYCODONE-ACETAMINOPHEN 5-325 MG PO TABS: 1.0000 | ORAL_TABLET | ORAL | 0 refills | Status: DC | PRN

## 2020-04-11 MED ORDER — METHOCARBAMOL 500 MG PO TABS
500.0000 mg | ORAL_TABLET | Freq: Four times a day (QID) | ORAL | 0 refills | Status: DC | PRN
Start: 2020-04-11 — End: 2021-11-27

## 2020-04-11 MED ORDER — ALUM & MAG HYDROXIDE-SIMETH 200-200-20 MG/5ML PO SUSP: 30.0000 mL | Freq: Four times a day (QID) | ORAL | Status: DC | PRN

## 2020-04-11 MED ORDER — ACETAMINOPHEN 650 MG RE SUPP: 650.0000 mg | RECTAL | Status: DC | PRN

## 2020-04-11 MED ORDER — CEFAZOLIN SODIUM-DEXTROSE 2-4 GM/100ML-% IV SOLN
2.0000 g | Freq: Three times a day (TID) | INTRAVENOUS | Status: DC
  Administered 2020-04-11: 2 g via INTRAVENOUS
  Filled 2020-04-11 (×2): qty 100

## 2020-04-11 MED ORDER — SODIUM CHLORIDE 0.9% FLUSH: 3.0000 mL | INTRAVENOUS | Status: DC | PRN

## 2020-04-11 MED ORDER — SENNOSIDES-DOCUSATE SODIUM 8.6-50 MG PO TABS: 1.0000 | ORAL_TABLET | Freq: Every evening | ORAL | Status: DC | PRN

## 2020-04-11 MED ORDER — HEPARIN SODIUM (PORCINE) 5000 UNIT/ML IJ SOLN
5000.0000 [IU] | Freq: Two times a day (BID) | INTRAMUSCULAR | Status: DC
  Administered 2020-04-11: 5000 [IU] via SUBCUTANEOUS
  Filled 2020-04-11: qty 1

## 2020-04-11 MED ORDER — MENTHOL 3 MG MT LOZG: 1.0000 | LOZENGE | OROMUCOSAL | Status: DC | PRN

## 2020-04-11 MED ORDER — HYDROCODONE-ACETAMINOPHEN 5-325 MG PO TABS
1.0000 | ORAL_TABLET | ORAL | Status: DC | PRN
Start: 2020-04-11 — End: 2020-04-11

## 2020-04-11 MED ORDER — PHENOL 1.4 % MT LIQD: 1.0000 | OROMUCOSAL | Status: DC | PRN

## 2020-04-11 MED ORDER — SODIUM CHLORIDE 0.9 % IV SOLN: INTRAVENOUS | Status: DC

## 2020-04-11 MED ORDER — METHOCARBAMOL 1000 MG/10ML IJ SOLN
500.0000 mg | Freq: Four times a day (QID) | INTRAVENOUS | Status: DC | PRN
  Filled 2020-04-11: qty 5

## 2020-04-11 MED ORDER — SODIUM CHLORIDE 0.9 % IV SOLN
250.0000 mL | INTRAVENOUS | Status: DC
  Administered 2020-04-11: 250 mL via INTRAVENOUS

## 2020-04-11 MED ORDER — METHOCARBAMOL 500 MG PO TABS: 500.0000 mg | ORAL_TABLET | Freq: Four times a day (QID) | ORAL | Status: DC | PRN

## 2020-04-11 MED ORDER — ONDANSETRON HCL 4 MG/2ML IJ SOLN: 4.0000 mg | Freq: Four times a day (QID) | INTRAMUSCULAR | Status: DC | PRN

## 2020-04-11 MED ORDER — SODIUM CHLORIDE 0.9% FLUSH
3.0000 mL | Freq: Two times a day (BID) | INTRAVENOUS | Status: DC
  Administered 2020-04-11: 3 mL via INTRAVENOUS

## 2020-04-11 MED ORDER — ACETAMINOPHEN 325 MG PO TABS: 650.0000 mg | ORAL_TABLET | ORAL | Status: DC | PRN

## 2020-04-11 NOTE — Progress Notes (Signed)
Physical Therapy Treatment Patient Details Name: Shaun Clarke MRN: [id number] DOB: 1960/08/03 Today's Date: 04/11/2020    History of Present Illness Pt is a 60 y.o. male who presented 3/31 with GI pain that did not resolve with PPI plus carafate medication. He came to CT abd/pelvis which was notable for diverticulosis and gastric wall thickening. He came to MRI which revealed soft tissue lesions in the thoracic spine region with encroachment into the spinal canal space causing severe spine stenosis and cord flattening w/o edema. 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 S/p T5-7 laminectomy for decompression of spinal cord and resection of tumor 4/1. 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 PMH: HLD and GERD.    PT Comments    Pt making excellent progress towards his physical therapy goals. Ambulating x 500 feet with a walker at a min guard assist level. Education reviewed regarding spinal precautions, exercise recommendations, appropriate DME, supervision for negotiating unlevel surfaces. Pt/pt wife with no further questions or concerns.     Follow Up Recommendations  No PT follow up     Equipment Recommendations  Other (comment) (shower chair)    Recommendations for Other Services       Precautions / Restrictions Precautions Precautions: Back Precaution Booklet Issued: Yes (comment) Precaution Comments: Pt recalling 3/3 Required Braces or Orthoses:  (No brace needed per secure chat with neurosurgery NP) Restrictions Weight Bearing Restrictions: No    Mobility  Bed Mobility               General bed mobility comments: OOB in chair    Transfers Overall transfer level: Needs assistance Equipment used: Rolling walker (2 wheeled) Transfers: Sit to/from Stand Sit to Stand: Supervision            Ambulation/Gait Ambulation/Gait assistance: Min guard Gait Distance  (Feet): 500 Feet Assistive device: Rolling walker (2 wheeled) Gait Pattern/deviations: Step-through pattern;Decreased stride length;Trunk flexed Gait velocity: reduced   General Gait Details: Cues for scapular retraction, pt with steady pace, no gross instability noted with level surfaces and head turns   Marine scientist Rankin (Stroke Patients Only)       Balance Overall balance assessment: Mild deficits observed, not formally tested                                          Cognition Arousal/Alertness: Awake/alert Behavior During Therapy: WFL for tasks assessed/performed Overall Cognitive Status: Within Functional Limits for tasks assessed                                        Exercises      General Comments        Pertinent Vitals/Pain Pain Assessment: Faces Faces Pain Scale: Hurts little more Pain Location: surgical site Pain Descriptors / Indicators: Grimacing;Operative site guarding Pain Intervention(s): Monitored during session;Premedicated before session    Home Living                      Prior Function            PT Goals (current goals can now be found in  the care plan section) Acute Rehab PT Goals Patient Stated Goal: to go home PT Goal Formulation: With patient/family Time For Goal Achievement: 04/23/20 Potential to Achieve Goals: Good Progress towards PT goals: Progressing toward goals    Frequency    Min 5X/week      PT Plan Current plan remains appropriate    Co-evaluation              AM-PAC PT "6 Clicks" Mobility   Outcome Measure  Help needed turning from your back to your side while in a flat bed without using bedrails?: None Help needed moving from lying on your back to sitting on the side of a flat bed without using bedrails?: A Little Help needed moving to and from a bed to a chair (including a wheelchair)?: A Little Help needed  standing up from a chair using your arms (e.g., wheelchair or bedside chair)?: A Little Help needed to walk in hospital room?: A Little Help needed climbing 3-5 steps with a railing? : A Little 6 Click Score: 19    End of Session Equipment Utilized During Treatment: Gait belt Activity Tolerance: Patient tolerated treatment well Patient left: with call bell/phone within reach;with family/visitor present;in chair Nurse Communication: Mobility status PT Visit Diagnosis: Unsteadiness on feet (R26.81);Other abnormalities of gait and mobility (R26.89);Difficulty in walking, not elsewhere classified (R26.2);Pain Pain - Right/Left:  (back) Pain - part of body:  (back)     Time: 0955-1010 PT Time Calculation (min) (ACUTE ONLY): 15 min  Charges:  $Gait Training: 8-22 mins                     Wyona Almas, PT, DPT Acute Rehabilitation Services Pager [phone] Office [phone]    Deno Etienne 04/11/2020, 11:15 AM

## 2020-04-11 NOTE — Progress Notes (Signed)
   Providing Compassionate, Quality Care - Together  NEUROSURGERY PROGRESS NOTE   S: No issues overnight. Doing well, numbness/tingling improving  O: EXAM:  BP (!) 103/59 (BP Location: Left Arm)   Pulse 68   Temp 98.8 F (37.1 C)   Resp 20   SpO2 97%   Awake, alert, oriented  Speech fluent, appropriate  CNs grossly intact  5/5 BUE/BLE  Some numbness in R ant thigh Improved truncal numbness  ASSESSMENT:  60 y.o. male with  1.  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 Mickeal Skinner) for when path comes back, likely spinal radiation needed   Thank you for allowing me to participate in this patient's care.  Please do not hesitate to call with questions or concerns.   Elwin Sleight, [facility] Cell: [phone]

## 2020-04-11 NOTE — Telephone Encounter (Signed)
I do not see anything in the message or conversation, I cannot see it for some reason.

## 2020-04-11 NOTE — Discharge Summary (Signed)
Physician Discharge Summary  Shaun Clarke TGG:[id number] DOB: 10/29/1960 DOA: 04/07/2020  PCP: Dettinger, Fransisca Kaufmann, MD  Admit date: 04/07/2020 Discharge date: 04/11/2020  Admitted From: home Disposition:  home  Recommendations for Outpatient Follow-up:  1. Follow up with PCP in 1-2 weeks 2. Please obtain BMP/CBC in one week 3. Follow up with neurosurgery in 1 month 4. Follow up with oncology in the next 1-2 weeks 5. Follow up with GI next week for scheduled EGD 6. Tissue biopsy results for epidural mass pending at the time of discharge  Home Health: Equipment/Devices:  Discharge Condition:stable CODE STATUS:full code Diet recommendation: regular diet  Brief/Interim Summary: 60 y/o male, admitted to the hospital with thoracic back pain. MRI imaging showed epidural tumor. He was seen by neurosurgery and underwent laminectomy and tumor resection. Tissue has been sent for biopsy. Post operative course unremarkable. Plans to follow up with neurosurgery and oncology as outpatient for further treatments.  Discharge Diagnoses:  Active Problems:   HLD (hyperlipidemia)   Spinal stenosis of thoracic region   Metastatic disease (HCC)  Epidural tumor, T5-T7 with bony mets -unclear primary -seen by neurosurgery and underwent laminectomy on 4/1 -follow up tissue pathology, currently pending -He was treated with IV steroids for compression of cord, but this was discontinued after surgery -Oncology following, plans outpatient follow up once there is a tissue diagnosis -CT chest shows nodule in LUL -Post op MRI T spine and brain performed and were unremarkable. Osseous mets to the calvarium and upper C spine were noted. Resolved cord compression -due to prior allergy to MRI contrast, patient was premedicated with prednisone -PT/OT evaluated patient and no PT/OT recommended on discharge -patient was ambulating and pain was reasonably controlled -it was felt appropriate to discharge patient  home  Gastric wall thickening -patient is scheduled with GI for endoscopy next week -continue on PPI, carafate  Possible diverticulitis -noted on CT abdomen -patient denies any pain, fever, vomiting or any other symptoms -would hold off on antibiotics for now and observe  Discharge Instructions  Discharge Instructions    Diet - low sodium heart healthy   Complete by: As directed    Incentive spirometry RT   Complete by: As directed    Increase activity slowly   Complete by: As directed    No wound care   Complete by: As directed      Allergies as of 04/11/2020      Reactions   Naproxen Anaphylaxis, Swelling, Other (See Comments)   Swelling of face and lips   Gadolinium Derivatives Hives, Itching      Medication List    STOP taking these medications   traMADol 50 MG tablet Commonly known as: ULTRAM     TAKE these medications   acetaminophen 500 MG tablet Commonly known as: TYLENOL Take 500-1,000 mg by mouth every 6 (six) hours as needed (for pain).   methocarbamol 500 MG tablet Commonly known as: ROBAXIN Take 1 tablet (500 mg total) by mouth every 6 (six) hours as needed for muscle spasms.   oxyCODONE-acetaminophen 5-325 MG tablet Commonly known as: PERCOCET/ROXICET Take 1 tablet by mouth every 4 (four) hours as needed for moderate pain.   pantoprazole 40 MG tablet Commonly known as: PROTONIX Take 1 tablet (40 mg total) by mouth 2 (two) times daily before a meal.   sucralfate 1 g tablet Commonly known as: CARAFATE Take 1 g by mouth 2 (two) times daily.       Follow-up Information    Dawley,  Troy C, DO Follow up in 1 month(s).   Contact information: [address] [place] [address] [phone]        Orson Slick, MD Follow up.   Specialty: Hematology and Oncology Why: office will contact you for appointment Contact information: [address] [place] [address] [phone]              Allergies   Allergen Reactions  . Naproxen Anaphylaxis, Swelling and Other (See Comments)    Swelling of face and lips   . Gadolinium Derivatives Hives and Itching    Consultations:  Neurosurgery, Dr. Reatha Armour  Oncology, Dr. Lorenso Courier   Procedures/Studies: DG Chest 2 View  Result Date: 03/19/2020 CLINICAL DATA:  Pleurisy, anterior lower chest pain for 4 days EXAM: CHEST - 2 VIEW COMPARISON:  10/06/2008 FINDINGS: The heart size and mediastinal contours are within normal limits. Both lungs are clear. The visualized skeletal structures are unremarkable. IMPRESSION: No active cardiopulmonary disease. Electronically Signed   By: Jerilynn Mages.  Shick M.D.   On: 03/19/2020 14:31   CT Chest W Contrast  Result Date: 04/07/2020 CLINICAL DATA:  Cancer of unknown primary, osseous metastatic disease on MRI of the thoracic spine EXAM: CT CHEST WITH CONTRAST TECHNIQUE: Multidetector CT imaging of the chest was performed during intravenous contrast administration. CONTRAST:  78mL OMNIPAQUE IOHEXOL 350 MG/ML SOLN COMPARISON:  04/06/2020, 04/01/2020, 03/17/2020 FINDINGS: Cardiovascular: The heart and great vessels are unremarkable without pericardial effusion. Minimal atherosclerosis of the aortic arch. No aneurysm or dissection. Mediastinum/Nodes: No enlarged mediastinal, hilar, or axillary lymph nodes. Thyroid gland, trachea, and esophagus demonstrate no significant findings. Lungs/Pleura: Areas of linear consolidation within the bilateral lower lobes most consistent with atelectasis or scarring. Mild background emphysema, upper lobe predominant, without superimposed airspace disease, effusion, or pneumothorax. Indeterminate 6 mm left upper lobe nodule image 107/7. No other nodules or masses. Upper Abdomen: Persistent mural. Gastric malignancy cannot thickening of the gastric fundus be excluded given the previous MRI findings. There is an enlarged epiphrenic lymph node on the right, measuring 12 mm in short axis reference image 125 of  series 3. Musculoskeletal: Paraspinal soft tissue masses are seen from T5 through T10, compatible with metastatic disease seen on earlier MRI. The epidural metastatic disease and diffuse bony metastases seen on MRI are less apparent by CT. There are no acute displaced fractures. Reconstructed images demonstrate no additional findings. IMPRESSION: 1. Thoracic paraspinal soft tissue masses consistent with metastatic disease seen on preceding MRI. Please refer to MRI report detailing thoracic osseous metastases, paraspinal metastatic disease, as well as epidural extension of tumor. 2. Persistent mural thickening of the gastric fundus. Gastric malignancy cannot be excluded given the thoracic spine findings. Endoscopy may be useful. 3. Indeterminate left upper lobe 6 mm pulmonary nodule. Close attention on follow-up is recommended. 4. Enlarged right epiphrenic lymph node, concerning for metastatic disease. 5. Aortic Atherosclerosis (ICD10-I70.0) and Emphysema (ICD10-J43.9). Electronically Signed   By: Randa Ngo M.D.   On: 04/07/2020 19:29   MR BRAIN W WO CONTRAST  Result Date: 04/10/2020 CLINICAL DATA:  Epidural tumor in the midthoracic spine with osseous metastases. Unknown primary. Patient does have a lung nodule. EXAM: MRI HEAD WITHOUT AND WITH CONTRAST TECHNIQUE: Multiplanar, multiecho pulse sequences of the brain and surrounding structures were obtained without and with intravenous contrast. CONTRAST:  7.63mL GADAVIST GADOBUTROL 1 MMOL/ML IV SOLN COMPARISON:  MRI of the thoracic spine without and with contrast 04/10/2020 FINDINGS: Brain: No acute infarct, hemorrhage, or mass  lesion is present. No significant white matter lesions are present. The ventricles are of normal size. No significant extraaxial fluid collection is present. Basal ganglia are within normal limits. The internal auditory canals are within normal limits. The brainstem and cerebellum are within normal limits. Postcontrast images demonstrate  no pathologic enhancement. Vascular: Flow is present in the major intracranial arteries. Skull and upper cervical spine: Osseous metastases are present at C2 and C3. There is heterogeneous enhancement of the calvarium consistent with osseous metastases. Spinal canal is within normal limits. Empty and enlarged sella noted. Sinuses/Orbits: The paranasal sinuses and mastoid air cells are clear. The globes and orbits are within normal limits. IMPRESSION: 1. Normal MRI appearance of the brain. 2. Osseous metastases to the calvarium and upper cervical spine. 3. Empty and enlarged sella. This is nonspecific, but can be seen in the setting of idiopathic intracranial hypertension. Electronically Signed   By: San Morelle M.D.   On: 04/10/2020 13:49   MR Thoracic Spine Wo Contrast  Result Date: 04/06/2020 CLINICAL DATA:  Back pain and weakness in the legs with difficulty walking. EXAM: MRI THORACIC SPINE WITHOUT CONTRAST TECHNIQUE: Multiplanar, multisequence MR imaging of the thoracic spine was performed. No intravenous contrast was administered. COMPARISON:  Thoracic spine radiographs 02/29/2020 FINDINGS: Alignment:  Normal. Vertebrae: Multiple T1 hypointense, STIR hyperintense lesions throughout the thoracic spine including prominent involvement of the vertebral bodies and posterior elements from T6-T9. Large volume epidural tumor dorsally and laterally in the spinal canal from T5-T8 measuring up to 1 cm in AP thickness and resulting in severe spinal stenosis with complete CSF effacement and moderate cord flattening. Extraosseous tumor anterior and lateral to the vertebral bodies from T6-T10. No fracture. Small Schmorl's nodes along the T6 and T7 inferior endplates. Suspected osseous metastatic disease in the cervical spine on large field of view sagittal counting images (C2 and C3 vertebral bodies and left C4 posterior elements), not well evaluated. Cord:  No cord edema. Paraspinal and other soft tissues:  Paraspinal tumor as above. Disc levels: Severe spinal stenosis in the midthoracic spine due to epidural tumor as above. Mild thoracic spondylosis without evidence of significant stenosis elsewhere. IMPRESSION: Extensive osseous metastatic disease including large volume paraspinal and epidural tumor in the mid and lower thoracic spine. Epidural tumor results in severe spinal stenosis and cord flattening without evidence of cord edema. These results will be called to the ordering clinician or representative by the Radiologist Assistant, and communication documented in the PACS or [employer]. Electronically Signed   By: Logan Bores M.D.   On: 04/06/2020 17:38   MR THORACIC SPINE W WO CONTRAST  Result Date: 04/10/2020 CLINICAL DATA:  Osseous metastatic disease with epidural tumor status post thoracic laminectomy 2 days ago. EXAM: MRI THORACIC WITHOUT AND WITH CONTRAST TECHNIQUE: Multiplanar and multiecho pulse sequences of the thoracic spine were obtained without and with intravenous contrast. CONTRAST:  7.60mL GADAVIST GADOBUTROL 1 MMOL/ML IV SOLN COMPARISON:  Thoracic spine radiographs 04/08/2020. Chest CT 04/07/2020 and thoracic MRI 04/06/2020. FINDINGS: Alignment:  Normal. Vertebrae: Patient has undergone interval wide decompressive laminectomies at T5, T6 and T7. Multifocal osseous metastatic disease is again noted, most prominent within the T6, T7, T8 and T9 vertebral bodies. No evidence of pathologic fracture. Cord: Resolution of previously demonstrated cord compression in the midthoracic spine status post decompressive laminectomy and epidural tumor resection. No abnormal intradural enhancement. Paraspinal and other soft tissues: Multifocal paraspinal tumor again noted, most prominent on the left from T6 through T9. Associated metastases  within the ribs. As above, interval debridement of posterior epidural tumor in the spinal canal. No new paraspinal abnormalities. Increased atelectasis at both lung  bases. Disc levels: No significant disc pathology. No residual cord deformity or significant foraminal compromise. IMPRESSION: 1. Interval decompressive laminectomies from T5 through T7 with epidural tumor resection and resolved cord compression. 2. No demonstrated postoperative complications. 3. Otherwise stable multifocal osseous metastatic disease with paraspinal tumor asymmetric to the left. No acute findings. Electronically Signed   By: Richardean Sale M.D.   On: 04/10/2020 13:49   CT ABDOMEN PELVIS W CONTRAST  Result Date: 04/02/2020 CLINICAL DATA:  Abdominal pain constipation x4 months. EXAM: CT ABDOMEN AND PELVIS WITH CONTRAST TECHNIQUE: Multidetector CT imaging of the abdomen and pelvis was performed using the standard protocol following bolus administration of intravenous contrast. CONTRAST:  144mL ISOVUE-300 IOPAMIDOL (ISOVUE-300) INJECTION 61% COMPARISON:  None. FINDINGS: Lower chest: Bibasilar atelectasis. Normal size heart. No significant pericardial effusion/thickening. Hepatobiliary: No suspicious hepatic lesions. Gallbladder is unremarkable. No biliary ductal dilation. Pancreas: Unremarkable. No pancreatic ductal dilatation or surrounding inflammatory changes. Spleen: Normal in size without focal abnormality. Adrenals/Urinary Tract: Adrenal glands are unremarkable. Kidneys are without renal calculi, solid lesion, or hydronephrosis. Small hypodense bilateral renal lesions which is technically too small to accurately characterize but favored represent a cyst. Bladder is unremarkable degree of distension. Stomach/Bowel: Orally ingested enteric contrast visualized to the level of small bowel. Mild wall thickening of the gastric antrum which least in part accentuated by under distension. No suspicious small bowel wall thickening or dilation. Appendix is surgically absent per report. Terminal ileum is grossly unremarkable. Extensive left-sided predominant colonic diverticulosis with long segment wall  thickening of a predominantly decompressed sigmoid colon, no adjacent inflammation to suggest acute diverticulitis. Vascular/Lymphatic: No significant vascular findings are present. No enlarged abdominal or pelvic lymph nodes. Reproductive: Prostate is unremarkable. Other: No abdominopelvic ascites. Musculoskeletal: Lumbar spondylosis.  No acute osseous abnormality. IMPRESSION: 1. Extensive left-sided predominant colonic diverticulosis without findings to suggest acute diverticulitis, but with apparent long segment wall thickening of the sigmoid colon which is at least in part accentuated by under distension. Finding which is nonspecific and may be physiologic, represent chronic segmental colitis associated with diverticulosis however underlying lesion not excluded. Recommend correlation with colonoscopy if not recently performed. 2. Mild wall thickening of the gastric antrum which least in part accentuated by under distension. Correlate for gastritis. Electronically Signed   By: Dahlia Bailiff MD   On: 04/02/2020 23:44   DG Thoracic Spine 1 View  Result Date: 04/08/2020 CLINICAL DATA:  Surgery, elective. Additional history provided: T5-7 laminectomy. EXAM: OPERATIVE THORACIC SPINE single VIEW. COMPARISON:  Chest CT 04/07/2020.  Thoracic spine MRI 04/06/2020. FINDINGS: A single AP intraoperative fluoroscopic image of the thoracic spine is submitted. The image demonstrates a metallic site marker projecting over the C5 vertebral body. Overlying retractors. Partially visualized support tubes. IMPRESSION: Single AP intraoperative fluoroscopic image of the thoracic spine with metallic site marker projecting over the T5 vertebral body, as described. Electronically Signed   By: Kellie Simmering DO   On: 04/08/2020 15:38   DG C-Arm 1-60 Min  Result Date: 04/08/2020 CLINICAL DATA:  Surgery, elective. Additional history provided: T5-7 laminectomy. EXAM: OPERATIVE THORACIC SPINE single VIEW. COMPARISON:  Chest CT 04/07/2020.   Thoracic spine MRI 04/06/2020. FINDINGS: A single AP intraoperative fluoroscopic image of the thoracic spine is submitted. The image demonstrates a metallic site marker projecting over the C5 vertebral body. Overlying retractors. Partially visualized support tubes.  IMPRESSION: Single AP intraoperative fluoroscopic image of the thoracic spine with metallic site marker projecting over the T5 vertebral body, as described. Electronically Signed   By: Kellie Simmering DO   On: 04/08/2020 15:38       Subjective: Has some soreness in his back. Able to ambulate, wants to go home  Discharge Exam: Vitals:   04/10/20 2000 04/11/20 0406 04/11/20 0834 04/11/20 1101  BP: (!) 103/58 111/64 (!) 103/59 104/61  Pulse: 60 (!) 58 68 68  Resp: 20 20 20    Temp: 97.8 F (36.6 C) 98.6 F (37 C) 98.8 F (37.1 C)   TempSrc: Oral Oral    SpO2: 100%  97%     General: Pt is alert, awake, not in acute distress Cardiovascular: RRR, S1/S2 +, no rubs, no gallops Respiratory: CTA bilaterally, no wheezing, no rhonchi Abdominal: Soft, NT, ND, bowel sounds + Extremities: no edema, no cyanosis    The results of significant diagnostics from this hospitalization (including imaging, microbiology, ancillary and laboratory) are listed below for reference.     Microbiology: Recent Results (from the past 240 hour(s))  Resp Panel by RT-PCR (Flu A&B, Covid) Nasopharyngeal Swab     Status: None   Collection Time: 04/07/20  4:00 PM   Specimen: Nasopharyngeal Swab; Nasopharyngeal(NP) swabs in vial transport medium  Result Value Ref Range Status   SARS Coronavirus 2 by RT PCR NEGATIVE NEGATIVE Final    Comment: (NOTE) SARS-CoV-2 target nucleic acids are NOT DETECTED.  The SARS-CoV-2 RNA is generally detectable in upper respiratory specimens during the acute phase of infection. The lowest concentration of SARS-CoV-2 viral copies this assay can detect is 138 copies/mL. A negative result does not preclude  SARS-Cov-2 infection and should not be used as the sole basis for treatment or other patient management decisions. A negative result may occur with  improper specimen collection/handling, submission of specimen other than nasopharyngeal swab, presence of viral mutation(s) within the areas targeted by this assay, and inadequate number of viral copies(<138 copies/mL). A negative result must be combined with clinical observations, patient history, and epidemiological information. The expected result is Negative.  Fact Sheet for Patients:  EntrepreneurPulse.com.au  Fact Sheet for Healthcare Providers:  IncredibleEmployment.be  This test is no t yet approved or cleared by the [place] FDA and  has been authorized for detection and/or diagnosis of SARS-CoV-2 by FDA under an Emergency Use Authorization (EUA). This EUA will remain  in effect (meaning this test can be used) for the duration of the COVID-19 declaration under Section 564(b)(1) of the Act, 21 U.S.C.section 360bbb-3(b)(1), unless the authorization is terminated  or revoked sooner.       Influenza A by PCR NEGATIVE NEGATIVE Final   Influenza B by PCR NEGATIVE NEGATIVE Final    Comment: (NOTE) The Xpert Xpress SARS-CoV-2/FLU/RSV plus assay is intended as an aid in the diagnosis of influenza from Nasopharyngeal swab specimens and should not be used as a sole basis for treatment. Nasal washings and aspirates are unacceptable for Xpert Xpress SARS-CoV-2/FLU/RSV testing.  Fact Sheet for Patients: EntrepreneurPulse.com.au  Fact Sheet for Healthcare Providers: IncredibleEmployment.be  This test is not yet approved or cleared by the [place] FDA and has been authorized for detection and/or diagnosis of SARS-CoV-2 by FDA under an Emergency Use Authorization (EUA). This EUA will remain in effect (meaning this test can be used) for the duration of  the COVID-19 declaration under Section 564(b)(1) of the Act, 21 U.S.C. section 360bbb-3(b)(1), unless the authorization is  terminated or revoked.  Performed at [facility], [place] [address], [place], [place] [address]   Surgical PCR screen     Status: None   Collection Time: 04/07/20 11:35 PM   Specimen: Nasal Mucosa; Nasal Swab  Result Value Ref Range Status   MRSA, PCR NEGATIVE NEGATIVE Final   Staphylococcus aureus NEGATIVE NEGATIVE Final    Comment: (NOTE) The Xpert SA Assay (FDA approved for NASAL specimens in patients 78 years of age and older), is one component of a comprehensive surveillance program. It is not intended to diagnose infection nor to guide or monitor treatment. Performed at [facility], [place] [address], [place], [place] [address]      Labs: BNP (last 3 results) No results for input(s): BNP in the last 8760 hours. Basic Metabolic Panel: Recent Labs  Lab 04/07/20 1452 04/11/20 0309  NA 136 139  K 4.0 3.9  CL 102 108  CO2 27 25  GLUCOSE 104* 117*  BUN 10 15  CREATININE 0.94 0.88  CALCIUM 9.1 8.6*   Liver Function Tests: Recent Labs  Lab 04/07/20 1452  AST 21  ALT 27  ALKPHOS 55  BILITOT 0.8  PROT 6.4*  ALBUMIN 4.0   No results for input(s): LIPASE, AMYLASE in the last 168 hours. No results for input(s): AMMONIA in the last 168 hours. CBC: Recent Labs  Lab 04/07/20 1452 04/11/20 0309  WBC 6.1 13.7*  NEUTROABS 4.3  --   HGB 15.0 12.3*  HCT 45.7 36.8*  MCV 87.7 88.9  PLT 257 221   Cardiac Enzymes: No results for input(s): CKTOTAL, CKMB, CKMBINDEX, TROPONINI in the last 168 hours. BNP: Invalid input(s): POCBNP CBG: No results for input(s): GLUCAP in the last 168 hours. D-Dimer No results for input(s): DDIMER in the last 72 hours. Hgb A1c No results for input(s): HGBA1C in the last 72 hours. Lipid Profile No results for input(s): CHOL, HDL, LDLCALC, TRIG, CHOLHDL, LDLDIRECT in the last 72 hours. Thyroid function  studies No results for input(s): TSH, T4TOTAL, T3FREE, THYROIDAB in the last 72 hours.  Invalid input(s): FREET3 Anemia work up No results for input(s): VITAMINB12, FOLATE, FERRITIN, TIBC, IRON, RETICCTPCT in the last 72 hours. Urinalysis No results found for: COLORURINE, APPEARANCEUR, [place], [place], GLUCOSEU, [place], [place], [place], PROTEINUR, UROBILINOGEN, NITRITE, LEUKOCYTESUR Sepsis Labs Invalid input(s): PROCALCITONIN,  WBC,  LACTICIDVEN Microbiology Recent Results (from the past 240 hour(s))  Resp Panel by RT-PCR (Flu A&B, Covid) Nasopharyngeal Swab     Status: None   Collection Time: 04/07/20  4:00 PM   Specimen: Nasopharyngeal Swab; Nasopharyngeal(NP) swabs in vial transport medium  Result Value Ref Range Status   SARS Coronavirus 2 by RT PCR NEGATIVE NEGATIVE Final    Comment: (NOTE) SARS-CoV-2 target nucleic acids are NOT DETECTED.  The SARS-CoV-2 RNA is generally detectable in upper respiratory specimens during the acute phase of infection. The lowest concentration of SARS-CoV-2 viral copies this assay can detect is 138 copies/mL. A negative result does not preclude SARS-Cov-2 infection and should not be used as the sole basis for treatment or other patient management decisions. A negative result may occur with  improper specimen collection/handling, submission of specimen other than nasopharyngeal swab, presence of viral mutation(s) within the areas targeted by this assay, and inadequate number of viral copies(<138 copies/mL). A negative result must be combined with clinical observations, patient history, and epidemiological information. The expected result is Negative.  Fact Sheet for Patients:  EntrepreneurPulse.com.au  Fact Sheet for Healthcare Providers:  IncredibleEmployment.be  This test is no t yet approved or cleared by the [place] and  has been authorized for detection and/or diagnosis of SARS-CoV-2  by FDA under an Emergency Use Authorization (EUA). This EUA will remain  in effect (meaning this test can be used) for the duration of the COVID-19 declaration under Section 564(b)(1) of the Act, 21 U.S.C.section 360bbb-3(b)(1), unless the authorization is terminated  or revoked sooner.       Influenza A by PCR NEGATIVE NEGATIVE Final   Influenza B by PCR NEGATIVE NEGATIVE Final    Comment: (NOTE) The Xpert Xpress SARS-CoV-2/FLU/RSV plus assay is intended as an aid in the diagnosis of influenza from Nasopharyngeal swab specimens and should not be used as a sole basis for treatment. Nasal washings and aspirates are unacceptable for Xpert Xpress SARS-CoV-2/FLU/RSV testing.  Fact Sheet for Patients: EntrepreneurPulse.com.au  Fact Sheet for Healthcare Providers: IncredibleEmployment.be  This test is not yet approved or cleared by the [place] FDA and has been authorized for detection and/or diagnosis of SARS-CoV-2 by FDA under an Emergency Use Authorization (EUA). This EUA will remain in effect (meaning this test can be used) for the duration of the COVID-19 declaration under Section 564(b)(1) of the Act, 21 U.S.C. section 360bbb-3(b)(1), unless the authorization is terminated or revoked.  Performed at [facility], [place] [address] [place], [place] [address]   Surgical PCR screen     Status: None   Collection Time: 04/07/20 11:35 PM   Specimen: Nasal Mucosa; Nasal Swab  Result Value Ref Range Status   MRSA, PCR NEGATIVE NEGATIVE Final   Staphylococcus aureus NEGATIVE NEGATIVE Final    Comment: (NOTE) The Xpert SA Assay (FDA approved for NASAL specimens in patients 59 years of age and older), is one component of a comprehensive surveillance program. It is not intended to diagnose infection nor to guide or monitor treatment. Performed at [facility], [place] [address] [place], [place] [address]      Time coordinating  discharge: 82mins  SIGNED:   Kathie Dike, MD  [facility] 04/11/2020, 11:08 AM   If 7PM-7AM, please contact night-coverage [email or web address]

## 2020-04-11 NOTE — Progress Notes (Signed)
[facility] BPs 1729 Lying 99/60, HR 60 1732 Sitting 105/59, HR 77 1736 Standing 93/52, HR 70

## 2020-04-11 NOTE — Progress Notes (Signed)
Discharge instructions discussed with patient and wife at bedside. All questions answered. No prescriptions printed out. IVs removed, intact, VSS. No further needs at this time, pt transported off unit by RN and wife.

## 2020-04-12 ENCOUNTER — Telehealth: Payer: Self-pay

## 2020-04-12 ENCOUNTER — Telehealth: Payer: Self-pay | Admitting: Hematology and Oncology

## 2020-04-12 ENCOUNTER — Other Ambulatory Visit: Payer: Self-pay | Admitting: Radiation Therapy

## 2020-04-12 NOTE — Telephone Encounter (Signed)
Scheduled follow-up appointment per 4/4 schedule message. Patient is aware.

## 2020-04-12 NOTE — Telephone Encounter (Signed)
Transition Care Management Follow-up Telephone Call  Date of discharge and from where: [facility]  How have you been since you were released from the hospital? Stable, still in a lot of pain  Any questions or concerns? No  Items Reviewed:  Did the pt receive and understand the discharge instructions provided? Yes   Medications obtained and verified? Yes   Other? No   Any new allergies since your discharge? No   Dietary orders reviewed? Yes  Do you have support at home? Yes   Home Care and Equipment/Supplies: Were home health services ordered? no Were any new equipment or medical supplies ordered?  No  Functional Questionnaire: (I = Independent and D = Dependent) ADLs: I  Bathing/Dressing- I  Meal Prep- I  Eating- I  Maintaining continence- I  Transferring/Ambulation- I  Managing Meds- I  Follow up appointments reviewed:   PCP Hospital f/u appt confirmed? No  Per patient, he has too many appointments already - He will be sure that Neurosurgeon and Oncology handle everything and will call us if he needs anything  [facility] f/u appt confirmed? Yes  Scheduled to see GI, Neurosurgeon and Oncology in the next couple of weeks.  Are transportation arrangements needed? No   If their condition worsens, is the pt aware to call PCP or go to the Emergency Dept.? Yes  Was the patient provided with contact information for the PCP's office or ED? Yes  Was to pt encouraged to call back with questions or concerns? Yes

## 2020-04-13 ENCOUNTER — Ambulatory Visit (HOSPITAL_COMMUNITY): Payer: 59 | Admitting: Hematology

## 2020-04-15 ENCOUNTER — Inpatient Hospital Stay: Payer: 59 | Attending: Hematology and Oncology | Admitting: Hematology and Oncology

## 2020-04-15 ENCOUNTER — Inpatient Hospital Stay: Payer: 59

## 2020-04-15 ENCOUNTER — Other Ambulatory Visit: Payer: Self-pay

## 2020-04-15 VITALS — BP 102/60 | HR 72 | Temp 95.9°F | Resp 17 | Wt 164.4 lb

## 2020-04-15 DIAGNOSIS — Z5112 Encounter for antineoplastic immunotherapy: Secondary | ICD-10-CM | POA: Insufficient documentation

## 2020-04-15 DIAGNOSIS — F17210 Nicotine dependence, cigarettes, uncomplicated: Secondary | ICD-10-CM | POA: Diagnosis not present

## 2020-04-15 DIAGNOSIS — C8339 Diffuse large B-cell lymphoma, extranodal and solid organ sites: Secondary | ICD-10-CM | POA: Insufficient documentation

## 2020-04-15 DIAGNOSIS — Z5189 Encounter for other specified aftercare: Secondary | ICD-10-CM | POA: Diagnosis not present

## 2020-04-15 DIAGNOSIS — Z5111 Encounter for antineoplastic chemotherapy: Secondary | ICD-10-CM | POA: Insufficient documentation

## 2020-04-15 DIAGNOSIS — C7951 Secondary malignant neoplasm of bone: Secondary | ICD-10-CM | POA: Insufficient documentation

## 2020-04-15 DIAGNOSIS — Z79899 Other long term (current) drug therapy: Secondary | ICD-10-CM | POA: Insufficient documentation

## 2020-04-15 LAB — CMP (CANCER CENTER ONLY)
ALT: 40 U/L (ref 0–44)
AST: 18 U/L (ref 15–41)
Albumin: 3.6 g/dL (ref 3.5–5.0)
Alkaline Phosphatase: 68 U/L (ref 38–126)
Anion gap: 12 (ref 5–15)
BUN: 22 mg/dL — ABNORMAL HIGH (ref 6–20)
CO2: 24 mmol/L (ref 22–32)
Calcium: 8.6 mg/dL — ABNORMAL LOW (ref 8.9–10.3)
Chloride: 105 mmol/L (ref 98–111)
Creatinine: 0.95 mg/dL (ref 0.61–1.24)
GFR, Estimated: >60 mL/min (ref >60)
Glucose, Bld: 105 mg/dL — ABNORMAL HIGH (ref 70–99)
Potassium: 4.3 mmol/L (ref 3.5–5.1)
Sodium: 141 mmol/L (ref 135–145)
Total Bilirubin: 0.3 mg/dL (ref 0.3–1.2)
Total Protein: 6.3 g/dL — ABNORMAL LOW (ref 6.5–8.1)

## 2020-04-15 LAB — CBC WITH DIFFERENTIAL (CANCER CENTER ONLY)
Abs Immature Granulocytes: 0.06 10*3/uL (ref 0.00–0.07)
Basophils Absolute: 0 10*3/uL (ref 0.0–0.1)
Basophils Relative: 1 %
Eosinophils Absolute: 0.1 10*3/uL (ref 0.0–0.5)
Eosinophils Relative: 2 %
HCT: 43 % (ref 39.0–52.0)
Hemoglobin: 14.2 g/dL (ref 13.0–17.0)
Immature Granulocytes: 1 %
Lymphocytes Relative: 8 %
Lymphs Abs: 0.7 10*3/uL (ref 0.7–4.0)
MCH: 28.7 pg (ref 26.0–34.0)
MCHC: 33 g/dL (ref 30.0–36.0)
MCV: 87 fL (ref 80.0–100.0)
Monocytes Absolute: 0.8 10*3/uL (ref 0.1–1.0)
Monocytes Relative: 10 %
Neutro Abs: 6.9 10*3/uL (ref 1.7–7.7)
Neutrophils Relative %: 78 %
Platelet Count: 275 10*3/uL (ref 150–400)
RBC: 4.94 MIL/uL (ref 4.22–5.81)
RDW: 15.7 % — ABNORMAL HIGH (ref 11.5–15.5)
WBC Count: 8.7 10*3/uL (ref 4.0–10.5)
nRBC: 0 % (ref 0.0–0.2)

## 2020-04-15 LAB — HEPATITIS B SURFACE ANTIBODY,QUALITATIVE: Hep B S Ab: NONREACTIVE

## 2020-04-15 LAB — HEPATITIS B CORE ANTIBODY, TOTAL: Hep B Core Total Ab: NONREACTIVE

## 2020-04-15 LAB — HEPATITIS C ANTIBODY: HCV Ab: NONREACTIVE

## 2020-04-15 LAB — URIC ACID: Uric Acid, Serum: 6.8 mg/dL (ref 3.7–8.6)

## 2020-04-15 LAB — HEPATITIS B SURFACE ANTIGEN: Hepatitis B Surface Ag: NONREACTIVE

## 2020-04-15 LAB — LACTATE DEHYDROGENASE: LDH: 150 U/L (ref 98–192)

## 2020-04-15 LAB — HIV ANTIBODY (ROUTINE TESTING W REFLEX): HIV Screen 4th Generation wRfx: NONREACTIVE

## 2020-04-15 NOTE — Progress Notes (Signed)
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 Lorenso Courier while inpatient. Scheduled for laminectomy/biopsy of metastatic spine lesion.  3) 04/08/2020: laminectomy performed, biopsy results show DLBCL.   Interval History:  Shaun Clarke 60 y.o. 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 The patient's last visit was while he was inpatient for his spine surgery. In the interim since the last visit pathology has returned as DLBCL.  On exam today Shaun Clarke is accompanied by his wife.  He reports that he is walking okay and is much improved from how he felt during his hospitalization.  He notes that his appetite is good and he is had no issues with fevers, chills, sweats, nausea, vomiting or diarrhea.  Surgical site is healing well and he is not having any issues with pain.  A full 10 point ROS is listed below.  The bulk of our discussion focused on the diagnosis of diffuse large B-cell lymphoma and the steps moving forward.  This conversation is detailed below.  MEDICAL HISTORY:  Past Medical History:  Diagnosis Date  . GERD (gastroesophageal reflux disease)     SURGICAL HISTORY: Past Surgical History:  Procedure Laterality Date  . APPENDECTOMY    . DECOMPRESSIVE LUMBAR LAMINECTOMY LEVEL 2 N/A 04/08/2020   Procedure: THORACIC FIVE-THORACIC SEVEN OPEN LAMINECTOMY FOR RESECTION OF EPIDURAL TUMOR ;  Surgeon: Karsten Ro, DO;  Location: [facility];  Service: Neurosurgery;  Laterality: N/A;  . KNEE  ARTHROSCOPY     right knee 3 times  . SHOULDER BIOPSY     right shoulder lipoma removal    SOCIAL HISTORY: Social History   Socioeconomic History  . Marital status: Married    Spouse name: Not on file  . Number of children: 2  . Years of education: Not on file  . Highest education level: Not on file  Occupational History  . Not on file  Tobacco Use  . Smoking status: Current Every Day Smoker    Packs/day: 1.00  . Smokeless tobacco: Never Used  Vaping Use  . Vaping Use: Never used  Substance and Sexual Activity  . Alcohol use: Yes    Alcohol/week: 3.0 standard drinks    Types: 1 Glasses of wine, 2 Cans of beer per week    Comment: per week  . Drug use: Never  . Sexual activity: Yes    Partners: Female  Other Topics Concern  . Not on file  Social History Narrative  . Not on file   Social Determinants of Health   Financial Resource Strain: Not on file  Food Insecurity: Not on file  Transportation Needs: Not on file  Physical Activity: Not on file  Stress: Not on file  Social Connections: Not on file  Intimate Partner Violence: Not on file    FAMILY HISTORY: Family History  Problem Relation Age of Onset  . Pancreatic cancer Mother   . Diabetes Father   . Anxiety disorder Sister   . Multiple sclerosis Sister   . COPD Maternal Grandfather     ALLERGIES:  is allergic  to naproxen and gadolinium derivatives.  MEDICATIONS:  Current Outpatient Medications  Medication Sig Dispense Refill  . allopurinol (ZYLOPRIM) 300 MG tablet Take 1 tablet (300 mg total) by mouth daily. 90 tablet 1  . lidocaine-prilocaine (EMLA) cream Apply 1 application topically as needed. 30 g 0  . ondansetron (ZOFRAN) 8 MG tablet Take 1 tablet (8 mg total) by mouth every 8 (eight) hours as needed for nausea or vomiting. 30 tablet 0  . prochlorperazine (COMPAZINE) 10 MG tablet Take 1 tablet (10 mg total) by mouth every 6 (six) hours as needed for nausea or vomiting. 30 tablet 0  .  acetaminophen (TYLENOL) 500 MG tablet Take 500-1,000 mg by mouth every 6 (six) hours as needed (for pain).    . methocarbamol (ROBAXIN) 500 MG tablet Take 1 tablet (500 mg total) by mouth every 6 (six) hours as needed for muscle spasms. 30 tablet 0  . oxyCODONE-acetaminophen (PERCOCET/ROXICET) 5-325 MG tablet Take 1 tablet by mouth every 4 (four) hours as needed for moderate pain. 30 tablet 0  . pantoprazole (PROTONIX) 40 MG tablet Take 1 tablet (40 mg total) by mouth 2 (two) times daily before a meal. (Patient not taking: Reported on 04/15/2020)    . sucralfate (CARAFATE) 1 g tablet Take 1 g by mouth 2 (two) times daily. (Patient not taking: Reported on 04/15/2020)     No current facility-administered medications for this visit.    REVIEW OF SYSTEMS:   Constitutional: ( - ) fevers, ( - )  chills , ( - ) night sweats Eyes: ( - ) blurriness of vision, ( - ) double vision, ( - ) watery eyes Ears, nose, mouth, throat, and face: ( - ) mucositis, ( - ) sore throat Respiratory: ( - ) cough, ( - ) dyspnea, ( - ) wheezes Cardiovascular: ( - ) palpitation, ( - ) chest discomfort, ( - ) lower extremity swelling Gastrointestinal:  ( - ) nausea, ( - ) heartburn, ( - ) change in bowel habits Skin: ( - ) abnormal skin rashes Lymphatics: ( - ) new lymphadenopathy, ( - ) easy bruising Neurological: ( - ) numbness, ( - ) tingling, ( - ) new weaknesses Behavioral/Psych: ( - ) mood change, ( - ) new changes  All other systems were reviewed with the patient and are negative.  PHYSICAL EXAMINATION: ECOG PERFORMANCE STATUS: 1 - Symptomatic but completely ambulatory  Vitals:   04/15/20 1356  BP: 102/60  Pulse: 72  Resp: 17  Temp: (!) 95.9 F (35.5 C)  SpO2: 98%   Filed Weights   04/15/20 1356  Weight: 164 lb 6.4 oz (74.6 kg)    GENERAL: alert, no distress and comfortable SKIN: skin color, texture, turgor are normal, no rashes or significant lesions EYES: conjunctiva are pink and non-injected, sclera  clear OROPHARYNX: no exudate, no erythema; lips, buccal mucosa, and tongue normal  NECK: supple, non-tender LYMPH:  no palpable lymphadenopathy in the cervical, axillary or inguinal LUNGS: clear to auscultation and percussion with normal breathing effort HEART: regular rate & rhythm and no murmurs and no lower extremity edema ABDOMEN: soft, non-tender, non-distended, normal bowel sounds Musculoskeletal: no cyanosis of digits and no clubbing  PSYCH: alert & oriented x 3, fluent speech NEURO: no focal motor/sensory deficits  LABORATORY DATA:  I have reviewed the data as listed CBC Latest Ref Rng & Units 04/15/2020 04/11/2020 04/07/2020  WBC 4.0 - 10.5 K/uL 8.7 13.7(H) 6.1  Hemoglobin 13.0 - 17.0 g/dL 14.2 12.3(L) 15.0  Hematocrit  39.0 - 52.0 % 43.0 36.8(L) 45.7  Platelets 150 - 400 K/uL 275 221 257    CMP Latest Ref Rng & Units 04/15/2020 04/11/2020 04/07/2020  Glucose 70 - 99 mg/dL 105(H) 117(H) 104(H)  BUN 6 - 20 mg/dL 22(H) 15 10  Creatinine 0.61 - 1.24 mg/dL 0.95 0.88 0.94  Sodium 135 - 145 mmol/L 141 139 136  Potassium 3.5 - 5.1 mmol/L 4.3 3.9 4.0  Chloride 98 - 111 mmol/L 105 108 102  CO2 22 - 32 mmol/L 24 25 27   Calcium 8.9 - 10.3 mg/dL 8.6(L) 8.6(L) 9.1  Total Protein 6.5 - 8.1 g/dL 6.3(L) - 6.4(L)  Total Bilirubin 0.3 - 1.2 mg/dL 0.3 - 0.8  Alkaline Phos 38 - 126 U/L 68 - 55  AST 15 - 41 U/L 18 - 21  ALT 0 - 44 U/L 40 - 27    Lab Results  Component Value Date   MPROTEIN Not Observed 04/07/2020   Lab Results  Component Value Date   KPAFRELGTCHN 11.4 04/07/2020   LAMBDASER 9.0 04/07/2020   KAPLAMBRATIO 1.27 04/07/2020     RADIOGRAPHIC STUDIES: CT Chest W Contrast  Result Date: 04/07/2020 CLINICAL DATA:  Cancer of unknown primary, osseous metastatic disease on MRI of the thoracic spine EXAM: CT CHEST WITH CONTRAST TECHNIQUE: Multidetector CT imaging of the chest was performed during intravenous contrast administration. CONTRAST:  71mL OMNIPAQUE IOHEXOL 350 MG/ML SOLN  COMPARISON:  04/06/2020, 04/01/2020, 03/17/2020 FINDINGS: Cardiovascular: The heart and great vessels are unremarkable without pericardial effusion. Minimal atherosclerosis of the aortic arch. No aneurysm or dissection. Mediastinum/Nodes: No enlarged mediastinal, hilar, or axillary lymph nodes. Thyroid gland, trachea, and esophagus demonstrate no significant findings. Lungs/Pleura: Areas of linear consolidation within the bilateral lower lobes most consistent with atelectasis or scarring. Mild background emphysema, upper lobe predominant, without superimposed airspace disease, effusion, or pneumothorax. Indeterminate 6 mm left upper lobe nodule image 107/7. No other nodules or masses. Upper Abdomen: Persistent mural. Gastric malignancy cannot thickening of the gastric fundus be excluded given the previous MRI findings. There is an enlarged epiphrenic lymph node on the right, measuring 12 mm in short axis reference image 125 of series 3. Musculoskeletal: Paraspinal soft tissue masses are seen from T5 through T10, compatible with metastatic disease seen on earlier MRI. The epidural metastatic disease and diffuse bony metastases seen on MRI are less apparent by CT. There are no acute displaced fractures. Reconstructed images demonstrate no additional findings. IMPRESSION: 1. Thoracic paraspinal soft tissue masses consistent with metastatic disease seen on preceding MRI. Please refer to MRI report detailing thoracic osseous metastases, paraspinal metastatic disease, as well as epidural extension of tumor. 2. Persistent mural thickening of the gastric fundus. Gastric malignancy cannot be excluded given the thoracic spine findings. Endoscopy may be useful. 3. Indeterminate left upper lobe 6 mm pulmonary nodule. Close attention on follow-up is recommended. 4. Enlarged right epiphrenic lymph node, concerning for metastatic disease. 5. Aortic Atherosclerosis (ICD10-I70.0) and Emphysema (ICD10-J43.9). Electronically Signed    By: Randa Ngo M.D.   On: 04/07/2020 19:29   MR BRAIN W WO CONTRAST  Result Date: 04/10/2020 CLINICAL DATA:  Epidural tumor in the midthoracic spine with osseous metastases. Unknown primary. Patient does have a lung nodule. EXAM: MRI HEAD WITHOUT AND WITH CONTRAST TECHNIQUE: Multiplanar, multiecho pulse sequences of the brain and surrounding structures were obtained without and with intravenous contrast. CONTRAST:  7.19mL GADAVIST GADOBUTROL 1 MMOL/ML IV SOLN COMPARISON:  MRI of the thoracic spine without and with contrast 04/10/2020 FINDINGS:  Brain: No acute infarct, hemorrhage, or mass lesion is present. No significant white matter lesions are present. The ventricles are of normal size. No significant extraaxial fluid collection is present. Basal ganglia are within normal limits. The internal auditory canals are within normal limits. The brainstem and cerebellum are within normal limits. Postcontrast images demonstrate no pathologic enhancement. Vascular: Flow is present in the major intracranial arteries. Skull and upper cervical spine: Osseous metastases are present at C2 and C3. There is heterogeneous enhancement of the calvarium consistent with osseous metastases. Spinal canal is within normal limits. Empty and enlarged sella noted. Sinuses/Orbits: The paranasal sinuses and mastoid air cells are clear. The globes and orbits are within normal limits. IMPRESSION: 1. Normal MRI appearance of the brain. 2. Osseous metastases to the calvarium and upper cervical spine. 3. Empty and enlarged sella. This is nonspecific, but can be seen in the setting of idiopathic intracranial hypertension. Electronically Signed   By: San Morelle M.D.   On: 04/10/2020 13:49   MR Thoracic Spine Wo Contrast  Result Date: 04/06/2020 CLINICAL DATA:  Back pain and weakness in the legs with difficulty walking. EXAM: MRI THORACIC SPINE WITHOUT CONTRAST TECHNIQUE: Multiplanar, multisequence MR imaging of the thoracic spine  was performed. No intravenous contrast was administered. COMPARISON:  Thoracic spine radiographs 02/29/2020 FINDINGS: Alignment:  Normal. Vertebrae: Multiple T1 hypointense, STIR hyperintense lesions throughout the thoracic spine including prominent involvement of the vertebral bodies and posterior elements from T6-T9. Large volume epidural tumor dorsally and laterally in the spinal canal from T5-T8 measuring up to 1 cm in AP thickness and resulting in severe spinal stenosis with complete CSF effacement and moderate cord flattening. Extraosseous tumor anterior and lateral to the vertebral bodies from T6-T10. No fracture. Small Schmorl's nodes along the T6 and T7 inferior endplates. Suspected osseous metastatic disease in the cervical spine on large field of view sagittal counting images (C2 and C3 vertebral bodies and left C4 posterior elements), not well evaluated. Cord:  No cord edema. Paraspinal and other soft tissues: Paraspinal tumor as above. Disc levels: Severe spinal stenosis in the midthoracic spine due to epidural tumor as above. Mild thoracic spondylosis without evidence of significant stenosis elsewhere. IMPRESSION: Extensive osseous metastatic disease including large volume paraspinal and epidural tumor in the mid and lower thoracic spine. Epidural tumor results in severe spinal stenosis and cord flattening without evidence of cord edema. These results will be called to the ordering clinician or representative by the Radiologist Assistant, and communication documented in the PACS or [employer]. Electronically Signed   By: Logan Bores M.D.   On: 04/06/2020 17:38   MR THORACIC SPINE W WO CONTRAST  Result Date: 04/10/2020 CLINICAL DATA:  Osseous metastatic disease with epidural tumor status post thoracic laminectomy 2 days ago. EXAM: MRI THORACIC WITHOUT AND WITH CONTRAST TECHNIQUE: Multiplanar and multiecho pulse sequences of the thoracic spine were obtained without and with intravenous  contrast. CONTRAST:  7.13mL GADAVIST GADOBUTROL 1 MMOL/ML IV SOLN COMPARISON:  Thoracic spine radiographs 04/08/2020. Chest CT 04/07/2020 and thoracic MRI 04/06/2020. FINDINGS: Alignment:  Normal. Vertebrae: Patient has undergone interval wide decompressive laminectomies at T5, T6 and T7. Multifocal osseous metastatic disease is again noted, most prominent within the T6, T7, T8 and T9 vertebral bodies. No evidence of pathologic fracture. Cord: Resolution of previously demonstrated cord compression in the midthoracic spine status post decompressive laminectomy and epidural tumor resection. No abnormal intradural enhancement. Paraspinal and other soft tissues: Multifocal paraspinal tumor again noted, most prominent on the  left from T6 through T9. Associated metastases within the ribs. As above, interval debridement of posterior epidural tumor in the spinal canal. No new paraspinal abnormalities. Increased atelectasis at both lung bases. Disc levels: No significant disc pathology. No residual cord deformity or significant foraminal compromise. IMPRESSION: 1. Interval decompressive laminectomies from T5 through T7 with epidural tumor resection and resolved cord compression. 2. No demonstrated postoperative complications. 3. Otherwise stable multifocal osseous metastatic disease with paraspinal tumor asymmetric to the left. No acute findings. Electronically Signed   By: Richardean Sale M.D.   On: 04/10/2020 13:49   CT ABDOMEN PELVIS W CONTRAST  Result Date: 04/02/2020 CLINICAL DATA:  Abdominal pain constipation x4 months. EXAM: CT ABDOMEN AND PELVIS WITH CONTRAST TECHNIQUE: Multidetector CT imaging of the abdomen and pelvis was performed using the standard protocol following bolus administration of intravenous contrast. CONTRAST:  124mL ISOVUE-300 IOPAMIDOL (ISOVUE-300) INJECTION 61% COMPARISON:  None. FINDINGS: Lower chest: Bibasilar atelectasis. Normal size heart. No significant pericardial effusion/thickening.  Hepatobiliary: No suspicious hepatic lesions. Gallbladder is unremarkable. No biliary ductal dilation. Pancreas: Unremarkable. No pancreatic ductal dilatation or surrounding inflammatory changes. Spleen: Normal in size without focal abnormality. Adrenals/Urinary Tract: Adrenal glands are unremarkable. Kidneys are without renal calculi, solid lesion, or hydronephrosis. Small hypodense bilateral renal lesions which is technically too small to accurately characterize but favored represent a cyst. Bladder is unremarkable degree of distension. Stomach/Bowel: Orally ingested enteric contrast visualized to the level of small bowel. Mild wall thickening of the gastric antrum which least in part accentuated by under distension. No suspicious small bowel wall thickening or dilation. Appendix is surgically absent per report. Terminal ileum is grossly unremarkable. Extensive left-sided predominant colonic diverticulosis with long segment wall thickening of a predominantly decompressed sigmoid colon, no adjacent inflammation to suggest acute diverticulitis. Vascular/Lymphatic: No significant vascular findings are present. No enlarged abdominal or pelvic lymph nodes. Reproductive: Prostate is unremarkable. Other: No abdominopelvic ascites. Musculoskeletal: Lumbar spondylosis.  No acute osseous abnormality. IMPRESSION: 1. Extensive left-sided predominant colonic diverticulosis without findings to suggest acute diverticulitis, but with apparent long segment wall thickening of the sigmoid colon which is at least in part accentuated by under distension. Finding which is nonspecific and may be physiologic, represent chronic segmental colitis associated with diverticulosis however underlying lesion not excluded. Recommend correlation with colonoscopy if not recently performed. 2. Mild wall thickening of the gastric antrum which least in part accentuated by under distension. Correlate for gastritis. Electronically Signed   By: Dahlia Bailiff MD   On: 04/02/2020 23:44   DG Thoracic Spine 1 View  Result Date: 04/08/2020 CLINICAL DATA:  Surgery, elective. Additional history provided: T5-7 laminectomy. EXAM: OPERATIVE THORACIC SPINE single VIEW. COMPARISON:  Chest CT 04/07/2020.  Thoracic spine MRI 04/06/2020. FINDINGS: A single AP intraoperative fluoroscopic image of the thoracic spine is submitted. The image demonstrates a metallic site marker projecting over the C5 vertebral body. Overlying retractors. Partially visualized support tubes. IMPRESSION: Single AP intraoperative fluoroscopic image of the thoracic spine with metallic site marker projecting over the T5 vertebral body, as described. Electronically Signed   By: Kellie Simmering DO   On: 04/08/2020 15:38   DG C-Arm 1-60 Min  Result Date: 04/08/2020 CLINICAL DATA:  Surgery, elective. Additional history provided: T5-7 laminectomy. EXAM: OPERATIVE THORACIC SPINE single VIEW. COMPARISON:  Chest CT 04/07/2020.  Thoracic spine MRI 04/06/2020. FINDINGS: A single AP intraoperative fluoroscopic image of the thoracic spine is submitted. The image demonstrates a metallic site marker projecting over the C5 vertebral body.  Overlying retractors. Partially visualized support tubes. IMPRESSION: Single AP intraoperative fluoroscopic image of the thoracic spine with metallic site marker projecting over the T5 vertebral body, as described. Electronically Signed   By: Kellie Simmering DO   On: 04/08/2020 15:38    ASSESSMENT & PLAN Shaun Clarke 60 y.o. 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  After review the labs, the records, schedule the patient the findings most consistent with a diffuse large B-cell lymphoma with staging in process.  We will complete the staging with a PET CT scan in order to assure there are no other sites of lymphadenopathy and active disease.  At this time his disease does appear as higher stage (III or IV) and  if that is the case after the PET CT scan we will plan for at least 6 cycles of R-CHOP followed by repeat scans.   Today we discussed the risks and benefits of R-CHOP chemotherapy.  We noted the nature of diffuse large B-cell lymphoma and the possibility of achieving a complete remission with this chemotherapy regimen.  I also noted the side effects which include but are not limited to nausea, vomiting, hair loss, diarrhea, sedation, insomnia, hyperglycemia, high blood pressure, and cardiotoxicity.  The patient voices understanding of the risk and benefits and was agreeable to proceeding with treatment at this time.  The R-CHOP regimen consists of rituximab 375 mg per metered squared IV on day 1, cyclophosphamide 750 mg per metered squared on day 1, doxorubicin 50 mg/m on day 1, vincristine 1.4 mg per metered squared with a max dose of 2 mg on day 1, and prednisone 100 mg orally days 1 through 5.  A cycle lasts for 21 days and number of cycles planned will depend on the stage as determined by the PET CT scan.  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  Given his metastatic disease in the spine he is at least a stage III or IV. --We will order a PET CT scan in order to complete staging.  Once the staging is complete we will be able to determine the IPI --Patient is recovering well from his neurosurgical procedure --Patient is currently scheduled for endoscopy and colonoscopy with Dr. Watt Climes on 04/21/2020.  Particular interest in the thickening of the lining of the stomach --Once staging is complete I would recommend we proceed with R-CHOP chemotherapy.  Ideally this would be started on 05/02/2020 --patient will require an echocardiogram prior to start.  --Plan for patient return to clinic prior to cycle 1 day 1 on 05/02/2020  #Supportive Care --chemotherapy education to be scheduled  --port placement  to be scheduled. --zofran 8mg  q8H PRN and compazine 10mg  PO q6H for nausea -- allopurinol 300mg  PO daily for TLS prophylaxis -- EMLA cream for port -- no pain medication required at this time.   Orders Placed This Encounter  Procedures  . IR Perc Tun Perit Cath W/Port    Standing Status:   Future    Standing Expiration Date:   04/15/2021    Order Specific Question:   Reason for exam:    Answer:   DLBCL, chemotherapy    Order Specific Question:   Preferred Imaging Location?    Answer:   [facility] PET Image Initial ([facility]) Skull Base To Thigh    Standing Status:  Future    Standing Expiration Date:   04/15/2021    Order Specific Question:   If indicated for the ordered procedure, I authorize the administration of a radiopharmaceutical per Radiology protocol    Answer:   Yes    Order Specific Question:   Preferred imaging location?    Answer:   [facility]  . CBC with Differential (Cancer Center Only)    Standing Status:   Future    Number of Occurrences:   1    Standing Expiration Date:   04/15/2021  . CMP ([facility] only)    Standing Status:   Future    Number of Occurrences:   1    Standing Expiration Date:   04/15/2021  . Lactate dehydrogenase (LDH)    Standing Status:   Future    Number of Occurrences:   1    Standing Expiration Date:   04/15/2021  . Beta 2 microglobulin    Standing Status:   Future    Number of Occurrences:   1    Standing Expiration Date:   04/15/2021  . Hepatitis B surface antigen    Standing Status:   Future    Number of Occurrences:   1    Standing Expiration Date:   04/15/2021  . Hepatitis B core antibody, total    Standing Status:   Future    Number of Occurrences:   1    Standing Expiration Date:   04/15/2021  . Hepatitis B surface antibody    Standing Status:   Future    Number of Occurrences:   1    Standing Expiration Date:   04/15/2021  . Hepatitis C antibody    Standing Status:   Future    Number of Occurrences:   1    Standing  Expiration Date:   04/15/2021  . HIV antibody (with reflex)    Standing Status:   Future    Number of Occurrences:   1    Standing Expiration Date:   04/15/2021  . Uric acid    Standing Status:   Future    Number of Occurrences:   1    Standing Expiration Date:   04/15/2021  . ECHOCARDIOGRAM COMPLETE    Standing Status:   Future    Standing Expiration Date:   04/15/2021    Scheduling Instructions:     Echo required prior to chemotherapy, please schedule ASAP    Order Specific Question:   Where should this test be performed    Answer:   [place]    Order Specific Question:   Perflutren DEFINITY (image enhancing agent) should be administered unless hypersensitivity or allergy exist    Answer:   Administer Perflutren    Order Specific Question:   Is a special reader required? (athlete or structural heart)    Answer:   No    Order Specific Question:   Does this study need to be read by the Structural team/Level 3 readers?    Answer:   No    Order Specific Question:   Reason for exam-Echo    Answer:   Chemo  Z09   All questions were answered. The patient knows to call the clinic with any problems, questions or concerns.  A total of more than 30 minutes were spent on this encounter and over half of that time was spent on counseling and coordination of care as outlined above.   Ledell Peoples, MD Department of Hematology/Oncology [facility]  [place] [facility] Phone: [phone] Pager: [phone] Email: [email or web address]  04/19/2020 11:30 AM

## 2020-04-16 LAB — BETA 2 MICROGLOBULIN, SERUM: Beta-2 Microglobulin: 1.4 mg/L (ref 0.6–2.4)

## 2020-04-19 ENCOUNTER — Encounter: Payer: Self-pay | Admitting: Hematology and Oncology

## 2020-04-19 ENCOUNTER — Inpatient Hospital Stay: Payer: 59 | Admitting: Internal Medicine

## 2020-04-19 DIAGNOSIS — C8339 Diffuse large B-cell lymphoma, extranodal and solid organ sites: Secondary | ICD-10-CM | POA: Insufficient documentation

## 2020-04-19 MED ORDER — ONDANSETRON HCL 8 MG PO TABS: 8.0000 mg | ORAL_TABLET | Freq: Three times a day (TID) | ORAL | 0 refills | Status: DC | PRN

## 2020-04-19 MED ORDER — ALLOPURINOL 300 MG PO TABS: 300.0000 mg | ORAL_TABLET | Freq: Every day | ORAL | 1 refills | Status: DC

## 2020-04-19 MED ORDER — LIDOCAINE-PRILOCAINE 2.5-2.5 % EX CREA: 1.0000 "application " | TOPICAL_CREAM | CUTANEOUS | 0 refills | Status: DC | PRN

## 2020-04-19 MED ORDER — PROCHLORPERAZINE MALEATE 10 MG PO TABS: 10.0000 mg | ORAL_TABLET | Freq: Four times a day (QID) | ORAL | 0 refills | Status: DC | PRN

## 2020-04-19 NOTE — Progress Notes (Signed)

## 2020-04-20 ENCOUNTER — Telehealth: Payer: Self-pay | Admitting: Hematology and Oncology

## 2020-04-20 NOTE — Telephone Encounter (Signed)
Scheduled appts per 4/12 sch msg. Called pt, no answer. Left msg with appts dates and times.

## 2020-04-25 ENCOUNTER — Inpatient Hospital Stay: Payer: 59

## 2020-04-25 NOTE — Progress Notes (Signed)
The following biosimilar Udenyca (pegfilgrastim-cbqv) has been selected for use in this patient per insurance.  Henreitta Leber, PharmD 04/25/20 @ [phone]

## 2020-04-25 NOTE — Progress Notes (Signed)
Pharmacist Chemotherapy Monitoring - Initial Assessment    Anticipated start date: 05/02/20   Regimen:  . Are orders appropriate based on the patient's diagnosis, regimen, and cycle? Yes . Does the plan date match the patient's scheduled date? Yes . Is the sequencing of drugs appropriate? Yes . Are the premedications appropriate for the patient's regimen? Yes . Prior Authorization for treatment is: Pending o If applicable, is the correct biosimilar selected based on the patient's insurance? yes  Organ Function and Labs: Marland Kitchen Are dose adjustments needed based on the patient's renal function, hepatic function, or hematologic function? Yes . Are appropriate labs ordered prior to the start of patient's treatment? Yes . Other organ system assessment, if indicated: N/A and anthracyclines: Echo/ MUGA . The following baseline labs, if indicated, have been ordered: rituximab: baseline Hepatitis B labs  Dose Assessment: . Are the drug doses appropriate? Yes . Are the following correct: o Drug concentrations Yes o IV fluid compatible with drug Yes o Administration routes Yes o Timing of therapy Yes . If applicable, does the patient have documented access for treatment and/or plans for port-a-cath placement? yes . If applicable, have lifetime cumulative doses been properly documented and assessed? yes Lifetime Dose Tracking  No doses have been documented on this patient for the following tracked chemicals: Doxorubicin, Epirubicin, Idarubicin, Daunorubicin, Mitoxantrone, Bleomycin, Oxaliplatin, Carboplatin, Liposomal Doxorubicin  o   Toxicity Monitoring/Prevention: . The patient has the following take home antiemetics prescribed: Ondansetron and Prochlorperazine . The patient has the following take home medications prescribed: Tumor Lysis Syndrome prophylaxis . Medication allergies and previous infusion related reactions, if applicable, have been reviewed and addressed. No . The patient's current  medication list has been assessed for drug-drug interactions with their chemotherapy regimen. no significant drug-drug interactions were identified on review.  Order Review: . Are the treatment plan orders signed? Yes . Is the patient scheduled to see a provider prior to their treatment? Yes  I verify that I have reviewed each item in the above checklist and answered each question accordingly.  Romualdo Bolk [place], [place], 04/25/2020  3:59 PM

## 2020-04-26 ENCOUNTER — Other Ambulatory Visit: Payer: Self-pay

## 2020-04-26 ENCOUNTER — Inpatient Hospital Stay: Payer: 59

## 2020-04-26 ENCOUNTER — Encounter (HOSPITAL_COMMUNITY)
Admission: RE | Admit: 2020-04-26 | Discharge: 2020-04-26 | Disposition: A | Discharge status: Home / Self Care | Payer: 59 | Source: Ambulatory Visit | Attending: Hematology and Oncology | Admitting: Hematology and Oncology

## 2020-04-26 DIAGNOSIS — C8339 Diffuse large B-cell lymphoma, extranodal and solid organ sites: Secondary | ICD-10-CM | POA: Diagnosis not present

## 2020-04-26 LAB — GLUCOSE, CAPILLARY: Glucose-Capillary: 94 mg/dL (ref 70–99)

## 2020-04-26 MED ORDER — FLUDEOXYGLUCOSE F - 18 (FDG) INJECTION
8.0700 | Freq: Once | INTRAVENOUS | Status: AC
  Administered 2020-04-26: 8.07 via INTRAVENOUS

## 2020-04-27 ENCOUNTER — Telehealth: Payer: Self-pay | Admitting: Hematology and Oncology

## 2020-04-27 NOTE — Telephone Encounter (Signed)
R/s appt per 4/20 sch msg. Pt's wife is aware.

## 2020-04-28 ENCOUNTER — Other Ambulatory Visit: Payer: Self-pay | Admitting: Radiology

## 2020-04-28 ENCOUNTER — Other Ambulatory Visit: Payer: Self-pay | Admitting: Physician Assistant

## 2020-04-28 NOTE — Progress Notes (Signed)
The following biosimilar Truxima (rituximab-abbs) has been selected for use in this patient.  Pharmacist Chemotherapy Monitoring - Initial Assessment    Anticipated start date: 05/02/20    Regimen:  . Are orders appropriate based on the patient's diagnosis, regimen, and cycle? Yes . Does the plan date match the patient's scheduled date? Yes . Is the sequencing of drugs appropriate? Yes . Are the premedications appropriate for the patient's regimen? Yes . Prior Authorization for treatment is: Pending o If applicable, is the correct biosimilar selected based on the patient's insurance? yes  Organ Function and Labs: Marland Kitchen Are dose adjustments needed based on the patient's renal function, hepatic function, or hematologic function? No  . Are appropriate labs ordered prior to the start of patient's treatment? Yes . Other organ system assessment, if indicated: anthracyclines: Echo/ MUGA . The following baseline labs, if indicated, have been ordered: rituximab: baseline Hepatitis B labs  Dose Assessment: . Are the drug doses appropriate? Yes . Are the following correct: o Drug concentrations Yes o IV fluid compatible with drug Yes o Administration routes Yes o Timing of therapy Yes . If applicable, does the patient have documented access for treatment and/or plans for port-a-cath placement? yes . If applicable, have lifetime cumulative doses been properly documented and assessed? yes Lifetime Dose Tracking  No doses have been documented on this patient for the following tracked chemicals: Doxorubicin, Epirubicin, Idarubicin, Daunorubicin, Mitoxantrone, Bleomycin, Oxaliplatin, Carboplatin, Liposomal Doxorubicin  o   Toxicity Monitoring/Prevention: . The patient has the following take home antiemetics prescribed: Ondansetron and Prochlorperazine . The patient has the following take home medications prescribed: Tumor Lysis Syndrome prophylaxis . Medication allergies and previous infusion related  reactions, if applicable, have been reviewed and addressed. Yes . The patient's current medication list has been assessed for drug-drug interactions with their chemotherapy regimen. no significant drug-drug interactions were identified on review.  Order Review: . Are the treatment plan orders signed? No . Is the patient scheduled to see a provider prior to their treatment? Yes  I verify that I have reviewed each item in the above checklist and answered each question accordingly.   Kennith Center, Pharm.D., CPP 04/28/2020@10 :40 AM

## 2020-04-29 ENCOUNTER — Encounter (HOSPITAL_COMMUNITY): Payer: Self-pay | Admitting: Hematology and Oncology

## 2020-04-29 ENCOUNTER — Other Ambulatory Visit: Payer: Self-pay | Admitting: Hematology and Oncology

## 2020-04-29 ENCOUNTER — Other Ambulatory Visit: Payer: Self-pay

## 2020-04-29 ENCOUNTER — Encounter (HOSPITAL_COMMUNITY): Payer: Self-pay

## 2020-04-29 ENCOUNTER — Ambulatory Visit (HOSPITAL_COMMUNITY)
Admission: RE | Admit: 2020-04-29 | Discharge: 2020-04-29 | Disposition: A | Discharge status: Home / Self Care | Payer: 59 | Source: Ambulatory Visit | Attending: Hematology and Oncology | Admitting: Hematology and Oncology

## 2020-04-29 DIAGNOSIS — C8330 Diffuse large B-cell lymphoma, unspecified site: Secondary | ICD-10-CM | POA: Diagnosis present

## 2020-04-29 DIAGNOSIS — F17210 Nicotine dependence, cigarettes, uncomplicated: Secondary | ICD-10-CM | POA: Insufficient documentation

## 2020-04-29 DIAGNOSIS — C8339 Diffuse large B-cell lymphoma, extranodal and solid organ sites: Secondary | ICD-10-CM

## 2020-04-29 DIAGNOSIS — Z79899 Other long term (current) drug therapy: Secondary | ICD-10-CM | POA: Insufficient documentation

## 2020-04-29 DIAGNOSIS — C83398 Diffuse large b-cell lymphoma of other extranodal and solid organ sites: Secondary | ICD-10-CM

## 2020-04-29 HISTORY — PX: IR IMAGING GUIDED PORT INSERTION: IMG5740

## 2020-04-29 MED ORDER — LIDOCAINE-EPINEPHRINE 1 %-1:100000 IJ SOLN
INTRAMUSCULAR | Status: AC | PRN
  Administered 2020-04-29 (×2): 10 mL via INTRADERMAL

## 2020-04-29 MED ORDER — FENTANYL CITRATE (PF) 100 MCG/2ML IJ SOLN
INTRAMUSCULAR | Status: AC | PRN
  Administered 2020-04-29 (×2): 50 ug via INTRAVENOUS

## 2020-04-29 MED ORDER — HEPARIN SOD (PORK) LOCK FLUSH 100 UNIT/ML IV SOLN
INTRAVENOUS | Status: AC
  Filled 2020-04-29: qty 5

## 2020-04-29 MED ORDER — HEPARIN SOD (PORK) LOCK FLUSH 100 UNIT/ML IV SOLN
INTRAVENOUS | Status: AC | PRN
  Administered 2020-04-29: 500 [IU] via INTRAVENOUS

## 2020-04-29 MED ORDER — LIDOCAINE-EPINEPHRINE 1 %-1:100000 IJ SOLN
INTRAMUSCULAR | Status: AC
  Filled 2020-04-29: qty 1

## 2020-04-29 MED ORDER — MIDAZOLAM HCL 2 MG/2ML IJ SOLN
INTRAMUSCULAR | Status: AC
  Filled 2020-04-29: qty 4

## 2020-04-29 MED ORDER — SODIUM CHLORIDE 0.9 % IV SOLN: INTRAVENOUS | Status: DC

## 2020-04-29 MED ORDER — CEFAZOLIN SODIUM-DEXTROSE 2-4 GM/100ML-% IV SOLN: 2.0000 g | INTRAVENOUS | Status: DC

## 2020-04-29 MED ORDER — FENTANYL CITRATE (PF) 100 MCG/2ML IJ SOLN
INTRAMUSCULAR | Status: AC
  Filled 2020-04-29: qty 2

## 2020-04-29 MED ORDER — MIDAZOLAM HCL 2 MG/2ML IJ SOLN
INTRAMUSCULAR | Status: AC | PRN
  Administered 2020-04-29 (×2): 1 mg via INTRAVENOUS

## 2020-04-29 NOTE — Discharge Instructions (Signed)
Implanted Port Insertion, Care After This sheet gives you information about how to care for yourself after your procedure. Your health care provider may also give you more specific instructions. If you have problems or questions, contact your health care provider. What can I expect after the procedure? After the procedure, it is common to have:  Discomfort at the port insertion site.  Bruising on the skin over the port. This should improve over 3-4 days. Follow these instructions at home: Port care  After your port is placed, you will get a manufacturer's information card. The card has information about your port. Keep this card with you at all times.  Take care of the port as told by your health care provider. Ask your health care provider if you or a family member can get training for taking care of the port at home. A home health care nurse may also take care of the port.  Make sure to remember what type of port you have. Incision care  Follow instructions from your health care provider about how to take care of your port insertion site. Make sure you: ? Wash your hands with soap and water before and after you change your bandage (dressing). If soap and water are not available, use hand sanitizer. ? Change your dressing as told by your health care provider. ? Leave stitches (sutures), skin glue, or adhesive strips in place. These skin closures may need to stay in place for 2 weeks or longer. If adhesive strip edges start to loosen and curl up, you may trim the loose edges. Do not remove adhesive strips completely unless your health care provider tells you to do that.  Check your port insertion site every day for signs of infection. Check for: ? Redness, swelling, or pain. ? Fluid or blood. ? Warmth. ? Pus or a bad smell.      Activity  Return to your normal activities as told by your health care provider. Ask your health care provider what activities are safe for you.  Do not  lift anything that is heavier than 10 lb (4.5 kg), or the limit that you are told, until your health care provider says that it is safe. General instructions  Take over-the-counter and prescription medicines only as told by your health care provider.  Do not take baths, swim, or use a hot tub until your health care provider approves. Ask your health care provider if you may take showers. You may only be allowed to take sponge baths.  Do not drive for 24 hours if you were given a sedative during your procedure.  Wear a medical alert bracelet in case of an emergency. This will tell any health care providers that you have a port.  Keep all follow-up visits as told by your health care provider. This is important. Contact a health care provider if:  You cannot flush your port with saline as directed, or you cannot draw blood from the port.  You have a fever or chills.  You have redness, swelling, or pain around your port insertion site.  You have fluid or blood coming from your port insertion site.  Your port insertion site feels warm to the touch.  You have pus or a bad smell coming from the port insertion site. Get help right away if:  You have chest pain or shortness of breath.  You have bleeding from your port that you cannot control. Summary  Take care of the port as told by your   health care provider. Keep the manufacturer's information card with you at all times.  Change your dressing as told by your health care provider.  Contact a health care provider if you have a fever or chills or if you have redness, swelling, or pain around your port insertion site.  Keep all follow-up visits as told by your health care provider. This information is not intended to replace advice given to you by your health care provider. Make sure you discuss any questions you have with your health care provider. Document Revised: 07/23/2017 Document Reviewed: 07/23/2017 Elsevier Patient Education   2021 [employer]  Moderate Conscious Sedation, Adult, Care After This sheet gives you information about how to care for yourself after your procedure. Your health care provider may also give you more specific instructions. If you have problems or questions, contact your health care provider. What can I expect after the procedure? After the procedure, it is common to have:  Sleepiness for several hours.  Impaired judgment for several hours.  Difficulty with balance.  Vomiting if you eat too soon. Follow these instructions at home: For the time period you were told by your health care provider:  Rest.  Do not participate in activities where you could fall or become injured.  Do not drive or use machinery.  Do not drink alcohol.  Do not take sleeping pills or medicines that cause drowsiness.  Do not make important decisions or sign legal documents.  Do not take care of children on your own.      Eating and drinking  Follow the diet recommended by your health care provider.  Drink enough fluid to keep your urine pale yellow.  If you vomit: ? Drink water, juice, or soup when you can drink without vomiting. ? Make sure you have little or no nausea before eating solid foods.   General instructions  Take over-the-counter and prescription medicines only as told by your health care provider.  Have a responsible adult stay with you for the time you are told. It is important to have someone help care for you until you are awake and alert.  Do not smoke.  Keep all follow-up visits as told by your health care provider. This is important. Contact a health care provider if:  You are still sleepy or having trouble with balance after 24 hours.  You feel light-headed.  You keep feeling nauseous or you keep vomiting.  You develop a rash.  You have a fever.  You have redness or swelling around the IV site. Get help right away if:  You have trouble breathing.  You have  new-onset confusion at home. Summary  After the procedure, it is common to feel sleepy, have impaired judgment, or feel nauseous if you eat too soon.  Rest after you get home. Know the things you should not do after the procedure.  Follow the diet recommended by your health care provider and drink enough fluid to keep your urine pale yellow.  Get help right away if you have trouble breathing or new-onset confusion at home. This information is not intended to replace advice given to you by your health care provider. Make sure you discuss any questions you have with your health care provider. Document Revised: 04/24/2019 Document Reviewed: 11/20/2018 Elsevier Patient Education  2021 [employer].  

## 2020-04-29 NOTE — H&P (Signed)
Chief Complaint: Patient was seen in consultation today for image guided Port-A-Cath placement at the request of [name]  Referring Physician(s): Dorsey,John T IV  Supervising Physician: Mir, Sharen Heck  Patient Status: [name] - Out-pt  History of Present Illness: Shaun Clarke is a 60 y.o. male with PMH of GERD and recent diagnosis of diffuse large B-cell lymphoma, original diagnosis date 04/08/2020.  Patient is new to our service. Patient had a visit with his PCP due to thoracic back pain and underwent MRI of T-spine which showed extensive osseous metastatic disease including large volume paraspinal and epidural tumor in the mid and lower thoracic spine.  Patient was sent to ED for further evaluation and underwent CT chest with contrast which again showed a thoracic paraspinal soft tissue mass consistent with metastatic disease , indeterminate night left upper lobe 6 mm pulmonary nodule , and an enlarged large epiphrenic lymph node concerning for metastatic disease.   Patient was admitted for further evaluation and management of the paraspinal and epidural tumor, and ultimately underwent laminectomy for tissue diagnosis and decompression of the spinal cord with Dr. Reatha Armour on 04/08/2020.  The pathology revealed a diffuse large B cell lymphoma.   Patient was discharged on 04/11/2020 and had follow-up visits with Dr. Lorenso Courier from oncology and underwent PET scan on 04/26/2020 which showed widespread skeletal involvement of malignancy with numerous Deauville 5 scattered osseous lesions without substantial associated CT abnormality, hypermetabolic lymph nodes in the neck, chest, and abdomen/pelvis, hypermetabolic activity in the left parotid gland and the posterior nasopharynx and in the palatine tonsils, and paraspinal tumor in the lower thorax along the pleural margin. Dr. Lorenso Courier recommended chemotherapy as the treatment plan for her newly diagnosed diffuse large B-cell lymphoma, and patient  decide to undergo a chemotherapy after discussion and shared decision making with Dr. Lorenso Courier.  IR was requested for image guided Port-A-Cath placement.  Patient laying in bed, not in acute distress.  Denise headache, fever, chills, shortness of breath, cough, chest pain, abdominal pain, nausea ,vomiting, and bleeding.   Past Medical History:  Diagnosis Date  . GERD (gastroesophageal reflux disease)     Past Surgical History:  Procedure Laterality Date  . APPENDECTOMY    . DECOMPRESSIVE LUMBAR LAMINECTOMY LEVEL 2 N/A 04/08/2020   Procedure: THORACIC FIVE-THORACIC SEVEN OPEN LAMINECTOMY FOR RESECTION OF EPIDURAL TUMOR ;  Surgeon: Karsten Ro, DO;  Location: [place];  Service: Neurosurgery;  Laterality: N/A;  . KNEE ARTHROSCOPY     right knee 3 times  . SHOULDER BIOPSY     right shoulder lipoma removal    Allergies: Naproxen and Gadolinium derivatives  Medications: Prior to Admission medications   Medication Sig Start Date End Date Taking? Authorizing Provider  acetaminophen (TYLENOL) 500 MG tablet Take 500-1,000 mg by mouth every 6 (six) hours as needed (for pain).   Yes [provider]  methocarbamol (ROBAXIN) 500 MG tablet Take 1 tablet (500 mg total) by mouth every 6 (six) hours as needed for muscle spasms. 04/11/20  Yes Kathie Dike, MD  oxyCODONE-acetaminophen (PERCOCET/ROXICET) 5-325 MG tablet Take 1 tablet by mouth every 4 (four) hours as needed for moderate pain. 04/11/20  Yes Kathie Dike, MD  pantoprazole (PROTONIX) 40 MG tablet Take 1 tablet (40 mg total) by mouth 2 (two) times daily before a meal. 04/11/20  Yes Kathie Dike, MD  allopurinol (ZYLOPRIM) 300 MG tablet Take 1 tablet (300 mg total) by mouth daily. 04/19/20   Orson Slick, MD  lidocaine-prilocaine (  EMLA) cream Apply 1 application topically as needed. 04/19/20   Orson Slick, MD  ondansetron (ZOFRAN) 8 MG tablet Take 1 tablet (8 mg total) by mouth every 8 (eight) hours as needed for nausea or  vomiting. 04/19/20   Orson Slick, MD  prochlorperazine (COMPAZINE) 10 MG tablet Take 1 tablet (10 mg total) by mouth every 6 (six) hours as needed for nausea or vomiting. 04/19/20   Orson Slick, MD  sucralfate (CARAFATE) 1 g tablet Take 1 g by mouth 2 (two) times daily. Patient not taking: No sig reported    [provider]     Family History  Problem Relation Age of Onset  . Pancreatic cancer Mother   . Diabetes Father   . Anxiety disorder Sister   . Multiple sclerosis Sister   . COPD Maternal Grandfather     Social History   Socioeconomic History  . Marital status: Married    Spouse name: Not on file  . Number of children: 2  . Years of education: Not on file  . Highest education level: Not on file  Occupational History  . Not on file  Tobacco Use  . Smoking status: Current Every Day Smoker    Packs/day: 1.00  . Smokeless tobacco: Never Used  Vaping Use  . Vaping Use: Never used  Substance and Sexual Activity  . Alcohol use: Yes    Alcohol/week: 3.0 standard drinks    Types: 1 Glasses of wine, 2 Cans of beer per week    Comment: per week  . Drug use: Never  . Sexual activity: Yes    Partners: Female  Other Topics Concern  . Not on file  Social History Narrative  . Not on file   Social Determinants of Health   Financial Resource Strain: Not on file  Food Insecurity: Not on file  Transportation Needs: Not on file  Physical Activity: Not on file  Stress: Not on file  Social Connections: Not on file     Review of Systems: A 12 point ROS discussed and pertinent positives are indicated in the HPI above.  All other systems are negative.   Vital Signs: BP 98/82   Pulse 61   Temp 98.4 F (36.9 C) (Oral)   Resp 16   Ht 5\' 8"  (1.727 m)   Wt 162 lb (73.5 kg)   SpO2 98%   BMI 24.63 kg/m   Physical Exam  Vitals and nursing note reviewed.  Constitutional:      General: He is not in acute distress.    Appearance: Normal appearance.   HENT:     Head: Normocephalic and atraumatic.     Mouth/Throat:     Mouth: Mucous membranes are moist.     Pharynx: Oropharynx is clear.  Cardiovascular:     Rate and Rhythm: Normal rate and regular rhythm.     Pulses: Normal pulses.     Heart sounds: Normal heart sounds.  Pulmonary:     Effort: Pulmonary effort is normal.     Breath sounds: Normal breath sounds. No wheezing, rhonchi or rales.  Abdominal:     General: Bowel sounds are normal. There is no distension.     Palpations: Abdomen is soft.  Skin:    General: Skin is warm and dry.  Neurological:     Mental Status: He is alert and oriented to person, place, and time.  Psychiatric:        Mood and Affect: Mood  normal.        Behavior: Behavior normal.    MD Evaluation Airway: WNL Heart: WNL Abdomen: WNL Chest/ Lungs: WNL ASA  Classification: 3 Mallampati/Airway Score: One  Imaging: CT Chest W Contrast  Result Date: 04/07/2020 CLINICAL DATA:  Cancer of unknown primary, osseous metastatic disease on MRI of the thoracic spine EXAM: CT CHEST WITH CONTRAST TECHNIQUE: Multidetector CT imaging of the chest was performed during intravenous contrast administration. CONTRAST:  65mL OMNIPAQUE IOHEXOL 350 MG/ML SOLN COMPARISON:  04/06/2020, 04/01/2020, 03/17/2020 FINDINGS: Cardiovascular: The heart and great vessels are unremarkable without pericardial effusion. Minimal atherosclerosis of the aortic arch. No aneurysm or dissection. Mediastinum/Nodes: No enlarged mediastinal, hilar, or axillary lymph nodes. Thyroid gland, trachea, and esophagus demonstrate no significant findings. Lungs/Pleura: Areas of linear consolidation within the bilateral lower lobes most consistent with atelectasis or scarring. Mild background emphysema, upper lobe predominant, without superimposed airspace disease, effusion, or pneumothorax. Indeterminate 6 mm left upper lobe nodule image 107/7. No other nodules or masses. Upper Abdomen: Persistent mural.  Gastric malignancy cannot thickening of the gastric fundus be excluded given the previous MRI findings. There is an enlarged epiphrenic lymph node on the right, measuring 12 mm in short axis reference image 125 of series 3. Musculoskeletal: Paraspinal soft tissue masses are seen from T5 through T10, compatible with metastatic disease seen on earlier MRI. The epidural metastatic disease and diffuse bony metastases seen on MRI are less apparent by CT. There are no acute displaced fractures. Reconstructed images demonstrate no additional findings. IMPRESSION: 1. Thoracic paraspinal soft tissue masses consistent with metastatic disease seen on preceding MRI. Please refer to MRI report detailing thoracic osseous metastases, paraspinal metastatic disease, as well as epidural extension of tumor. 2. Persistent mural thickening of the gastric fundus. Gastric malignancy cannot be excluded given the thoracic spine findings. Endoscopy may be useful. 3. Indeterminate left upper lobe 6 mm pulmonary nodule. Close attention on follow-up is recommended. 4. Enlarged right epiphrenic lymph node, concerning for metastatic disease. 5. Aortic Atherosclerosis (ICD10-I70.0) and Emphysema (ICD10-J43.9). Electronically Signed   By: Randa Ngo M.D.   On: 04/07/2020 19:29   MR BRAIN W WO CONTRAST  Result Date: 04/10/2020 CLINICAL DATA:  Epidural tumor in the midthoracic spine with osseous metastases. Unknown primary. Patient does have a lung nodule. EXAM: MRI HEAD WITHOUT AND WITH CONTRAST TECHNIQUE: Multiplanar, multiecho pulse sequences of the brain and surrounding structures were obtained without and with intravenous contrast. CONTRAST:  7.74mL GADAVIST GADOBUTROL 1 MMOL/ML IV SOLN COMPARISON:  MRI of the thoracic spine without and with contrast 04/10/2020 FINDINGS: Brain: No acute infarct, hemorrhage, or mass lesion is present. No significant white matter lesions are present. The ventricles are of normal size. No significant  extraaxial fluid collection is present. Basal ganglia are within normal limits. The internal auditory canals are within normal limits. The brainstem and cerebellum are within normal limits. Postcontrast images demonstrate no pathologic enhancement. Vascular: Flow is present in the major intracranial arteries. Skull and upper cervical spine: Osseous metastases are present at C2 and C3. There is heterogeneous enhancement of the calvarium consistent with osseous metastases. Spinal canal is within normal limits. Empty and enlarged sella noted. Sinuses/Orbits: The paranasal sinuses and mastoid air cells are clear. The globes and orbits are within normal limits. IMPRESSION: 1. Normal MRI appearance of the brain. 2. Osseous metastases to the calvarium and upper cervical spine. 3. Empty and enlarged sella. This is nonspecific, but can be seen in the setting of idiopathic intracranial hypertension. Electronically  Signed   By: San Morelle M.D.   On: 04/10/2020 13:49   MR Thoracic Spine Wo Contrast  Result Date: 04/06/2020 CLINICAL DATA:  Back pain and weakness in the legs with difficulty walking. EXAM: MRI THORACIC SPINE WITHOUT CONTRAST TECHNIQUE: Multiplanar, multisequence MR imaging of the thoracic spine was performed. No intravenous contrast was administered. COMPARISON:  Thoracic spine radiographs 02/29/2020 FINDINGS: Alignment:  Normal. Vertebrae: Multiple T1 hypointense, STIR hyperintense lesions throughout the thoracic spine including prominent involvement of the vertebral bodies and posterior elements from T6-T9. Large volume epidural tumor dorsally and laterally in the spinal canal from T5-T8 measuring up to 1 cm in AP thickness and resulting in severe spinal stenosis with complete CSF effacement and moderate cord flattening. Extraosseous tumor anterior and lateral to the vertebral bodies from T6-T10. No fracture. Small Schmorl's nodes along the T6 and T7 inferior endplates. Suspected osseous metastatic  disease in the cervical spine on large field of view sagittal counting images (C2 and C3 vertebral bodies and left C4 posterior elements), not well evaluated. Cord:  No cord edema. Paraspinal and other soft tissues: Paraspinal tumor as above. Disc levels: Severe spinal stenosis in the midthoracic spine due to epidural tumor as above. Mild thoracic spondylosis without evidence of significant stenosis elsewhere. IMPRESSION: Extensive osseous metastatic disease including large volume paraspinal and epidural tumor in the mid and lower thoracic spine. Epidural tumor results in severe spinal stenosis and cord flattening without evidence of cord edema. These results will be called to the ordering clinician or representative by the Radiologist Assistant, and communication documented in the PACS or [employer]. Electronically Signed   By: Logan Bores M.D.   On: 04/06/2020 17:38   MR THORACIC SPINE W WO CONTRAST  Result Date: 04/10/2020 CLINICAL DATA:  Osseous metastatic disease with epidural tumor status post thoracic laminectomy 2 days ago. EXAM: MRI THORACIC WITHOUT AND WITH CONTRAST TECHNIQUE: Multiplanar and multiecho pulse sequences of the thoracic spine were obtained without and with intravenous contrast. CONTRAST:  7.63mL GADAVIST GADOBUTROL 1 MMOL/ML IV SOLN COMPARISON:  Thoracic spine radiographs 04/08/2020. Chest CT 04/07/2020 and thoracic MRI 04/06/2020. FINDINGS: Alignment:  Normal. Vertebrae: Patient has undergone interval wide decompressive laminectomies at T5, T6 and T7. Multifocal osseous metastatic disease is again noted, most prominent within the T6, T7, T8 and T9 vertebral bodies. No evidence of pathologic fracture. Cord: Resolution of previously demonstrated cord compression in the midthoracic spine status post decompressive laminectomy and epidural tumor resection. No abnormal intradural enhancement. Paraspinal and other soft tissues: Multifocal paraspinal tumor again noted, most prominent on the  left from T6 through T9. Associated metastases within the ribs. As above, interval debridement of posterior epidural tumor in the spinal canal. No new paraspinal abnormalities. Increased atelectasis at both lung bases. Disc levels: No significant disc pathology. No residual cord deformity or significant foraminal compromise. IMPRESSION: 1. Interval decompressive laminectomies from T5 through T7 with epidural tumor resection and resolved cord compression. 2. No demonstrated postoperative complications. 3. Otherwise stable multifocal osseous metastatic disease with paraspinal tumor asymmetric to the left. No acute findings. Electronically Signed   By: Richardean Sale M.D.   On: 04/10/2020 13:49   CT ABDOMEN PELVIS W CONTRAST  Result Date: 04/02/2020 CLINICAL DATA:  Abdominal pain constipation x4 months. EXAM: CT ABDOMEN AND PELVIS WITH CONTRAST TECHNIQUE: Multidetector CT imaging of the abdomen and pelvis was performed using the standard protocol following bolus administration of intravenous contrast. CONTRAST:  121mL ISOVUE-300 IOPAMIDOL (ISOVUE-300) INJECTION 61% COMPARISON:  None. FINDINGS:  Lower chest: Bibasilar atelectasis. Normal size heart. No significant pericardial effusion/thickening. Hepatobiliary: No suspicious hepatic lesions. Gallbladder is unremarkable. No biliary ductal dilation. Pancreas: Unremarkable. No pancreatic ductal dilatation or surrounding inflammatory changes. Spleen: Normal in size without focal abnormality. Adrenals/Urinary Tract: Adrenal glands are unremarkable. Kidneys are without renal calculi, solid lesion, or hydronephrosis. Small hypodense bilateral renal lesions which is technically too small to accurately characterize but favored represent a cyst. Bladder is unremarkable degree of distension. Stomach/Bowel: Orally ingested enteric contrast visualized to the level of small bowel. Mild wall thickening of the gastric antrum which least in part accentuated by under distension. No  suspicious small bowel wall thickening or dilation. Appendix is surgically absent per report. Terminal ileum is grossly unremarkable. Extensive left-sided predominant colonic diverticulosis with long segment wall thickening of a predominantly decompressed sigmoid colon, no adjacent inflammation to suggest acute diverticulitis. Vascular/Lymphatic: No significant vascular findings are present. No enlarged abdominal or pelvic lymph nodes. Reproductive: Prostate is unremarkable. Other: No abdominopelvic ascites. Musculoskeletal: Lumbar spondylosis.  No acute osseous abnormality. IMPRESSION: 1. Extensive left-sided predominant colonic diverticulosis without findings to suggest acute diverticulitis, but with apparent long segment wall thickening of the sigmoid colon which is at least in part accentuated by under distension. Finding which is nonspecific and may be physiologic, represent chronic segmental colitis associated with diverticulosis however underlying lesion not excluded. Recommend correlation with colonoscopy if not recently performed. 2. Mild wall thickening of the gastric antrum which least in part accentuated by under distension. Correlate for gastritis. Electronically Signed   By: Dahlia Bailiff MD   On: 04/02/2020 23:44   DG Thoracic Spine 1 View  Result Date: 04/08/2020 CLINICAL DATA:  Surgery, elective. Additional history provided: T5-7 laminectomy. EXAM: OPERATIVE THORACIC SPINE single VIEW. COMPARISON:  Chest CT 04/07/2020.  Thoracic spine MRI 04/06/2020. FINDINGS: A single AP intraoperative fluoroscopic image of the thoracic spine is submitted. The image demonstrates a metallic site marker projecting over the C5 vertebral body. Overlying retractors. Partially visualized support tubes. IMPRESSION: Single AP intraoperative fluoroscopic image of the thoracic spine with metallic site marker projecting over the T5 vertebral body, as described. Electronically Signed   By: Kellie Simmering DO   On: 04/08/2020  15:38   NM PET Image Initial (PI) Skull Base To Thigh  Result Date: 04/26/2020 CLINICAL DATA:  Initial treatment strategy for B-cell lymphoma. Prior midthoracic epidural tumor resection and known osseous metastatic disease. EXAM: NUCLEAR MEDICINE PET SKULL VERTEX TO THIGH TECHNIQUE: 8.1 mCi F-18 FDG was injected intravenously. Full-ring PET imaging was performed from the skull vertex to thigh after the radiotracer. CT data was obtained and used for attenuation correction and anatomic localization. Fasting blood glucose: 94 mg/dl COMPARISON:  Multiple exams, including 04/01/2020 CT abdomen FINDINGS: Mediastinal blood pool activity: SUV max 1.8 Liver activity: SUV max 2.9 HEAD/NECK: Small but hypermetabolic bilateral level IIb lymph nodes are present along with left eccentric small hypermetabolic suboccipital lymph nodes. Index left level IIb lymph node measuring 0.6 cm in short axis on image 49 series 4 has maximum SUV of 4.7, Deauville 4. Hypermetabolic activity in both palatine tonsils noted, maximum SUV on the left 13.1 and on the right 12.0, both Deauville 5. A hypermetabolic but indistinctly marginated lesion in the left parotid gland measuring about 1.2 cm in diameter has a maximum SUV of 10.4, Deauville 5. A small hypermetabolic lymph node just below the left parotid gland has maximum SUV of 9.9, Deauville 5. Posterior nasopharyngeal activity with maximum SUV 9.0, Deauville 5. Incidental  CT findings: none CHEST: Hypermetabolic right pericardial lymph node 0.8 cm in short axis on image 122 of series 4, maximum SUV 8.6, Deauville 5. Notable paraspinal tumor is observed extending from the T5-6 vertebral level to the T10 vertebral level. At the T9-10 level, this tumor rind measures 1.8 cm in short axis on image 114 of series 4 with maximum SUV 11.0, Deauville 5. This previously measured 1.9 cm in thickness on 04/07/2020. Small mildly hypermetabolic axillary and left subpectoral lymph nodes are present. A left  subpectoral node measuring 0.6 cm in short axis on image 74 series 4 has a maximum SUV of 4.3, Deauville 4. Low-grade postoperative metabolic activity along the midthoracic laminectomy site and adjacent midline postoperative region. Incidental CT findings: Atherosclerotic calcification of the aortic arch. Bandlike atelectasis or scarring in both lower lobes. ABDOMEN/PELVIS: Physiologic activity in bowel. Left common iliac node 1.7 cm in short axis on image 173 of series 4 (formerly 1.8 cm on 04/01/2020) with maximum SUV 11.0, Deauville 5. Small hypermetabolic bilateral external iliac and inguinal lymph nodes. Index left inguinal lymph node 1.0 cm in short axis on image 214 of series 4 (formerly the same) with maximum SUV 14.0, Deauville 5. Incidental CT findings: Sigmoid colon diverticulosis. SKELETON: Widespread hypermetabolic skeletal lesions with little in the way of corresponding CT abnormality, throughout the visualized skeleton to include the skull base, spine, proximal humerus bilaterally, both scapula, sternum, ribs, bony pelvis, and proximal femurs. Index lesion anteriorly in the L3 vertebral body with maximum SUV 11.4, Deauville 5. Index lesion posteriorly in the right iliac bone maximum SUV 10.6 comment over L5. Index lesion in the left proximal femoral shaft, maximum SUV 8.2, Deauville 5. Incidental CT findings: Bridging spurring of the left sacroiliac joint. IMPRESSION: 1. Widespread skeletal involvement of malignancy with numerous Deauville 5 scattered osseous lesions without substantial associated CT abnormality. 2. Hypermetabolic lymph nodes in the neck, chest, and abdomen/pelvis are primarily Deauville 5 with some Deauville 4. Many of these nodes are small or normal in size but hypermetabolic. Few of the nodes are abnormally enlarged, for example the left common iliac node measuring 1.7 cm in short axis. 3. Hypermetabolic activity in the left parotid gland could represent a hypermetabolic parotid  lymph node or a primary parotid neoplasm. 4. Hypermetabolic activity in the posterior nasopharynx and in the palatine tonsils (Deauville 5). 5. Paraspinal tumor in the lower thorax along the pleural margin. Prior midthoracic laminectomy recently. 6. Other imaging findings of potential clinical significance: Aortic Atherosclerosis (ICD10-I70.0). Mild atelectasis in both lower lobes. Sigmoid colon diverticulosis. Electronically Signed   By: Van Clines M.D.   On: 04/26/2020 15:19   DG C-Arm 1-60 Min  Result Date: 04/08/2020 CLINICAL DATA:  Surgery, elective. Additional history provided: T5-7 laminectomy. EXAM: OPERATIVE THORACIC SPINE single VIEW. COMPARISON:  Chest CT 04/07/2020.  Thoracic spine MRI 04/06/2020. FINDINGS: A single AP intraoperative fluoroscopic image of the thoracic spine is submitted. The image demonstrates a metallic site marker projecting over the C5 vertebral body. Overlying retractors. Partially visualized support tubes. IMPRESSION: Single AP intraoperative fluoroscopic image of the thoracic spine with metallic site marker projecting over the T5 vertebral body, as described. Electronically Signed   By: Kellie Simmering DO   On: 04/08/2020 15:38    Labs:  CBC: Recent Labs    02/17/20 1703 04/07/20 1452 04/11/20 0309 04/15/20 1517  WBC 6.1 6.1 13.7* 8.7  HGB 16.1 15.0 12.3* 14.2  HCT 47.1 45.7 36.8* 43.0  PLT 268 257 221 275  COAGS: No results for input(s): INR, APTT in the last 8760 hours.  BMP: Recent Labs    02/17/20 1703 04/07/20 1452 04/11/20 0309 04/15/20 1517  NA 138 136 139 141  K 4.2 4.0 3.9 4.3  CL 103 102 108 105  CO2 21 27 25 24   GLUCOSE 91 104* 117* 105*  BUN 14 10 15  22*  CALCIUM 9.7 9.1 8.6* 8.6*  CREATININE 1.02 0.94 0.88 0.95  GFRNONAA 81 >60 >60 >60  GFRAA 93  --   --   --     LIVER FUNCTION TESTS: Recent Labs    02/17/20 1703 04/07/20 1452 04/15/20 1517  BILITOT 0.4 0.8 0.3  AST 17 21 18   ALT 16 27 40  ALKPHOS 76 55 68   PROT 7.0 6.4* 6.3*  ALBUMIN 4.7 4.0 3.6    TUMOR MARKERS: No results for input(s): AFPTM, CEA, CA199, CHROMGRNA in the last 8760 hours.  Assessment and Plan: 60 y.o. male with newly diagnosed diffuse large B-cell lymphoma.   After thorough discussion and shared decision making with his oncology team, Patient decided undergo chemotherapy. IR was requested for image guided Port-A-Cath placement. Patient presents to IR today for the procedure. N.p.o. since midnight Not on anticoagulation/antiplatelet treatment. Afebrile  Risks and benefits of image guided port-a-catheter placement was discussed with the patient including, but not limited to bleeding, infection, pneumothorax, or fibrin sheath development and need for additional procedures.  All of the patient's questions were answered, patient is agreeable to proceed. Consent signed and in chart.   Thank you for this interesting consult.  I greatly enjoyed meeting ANGELGABRIEL KRANE and look forward to participating in their care.  A copy of this report was sent to the requesting provider on this date.  Electronically Signed: Tera Mater, PA-C 04/29/2020, 8:48 AM   I spent a total of  30 Minutes   in face to face in clinical consultation, greater than 50% of which was counseling/coordinating care for image guided Port-A-Cath placement.

## 2020-04-29 NOTE — Procedures (Signed)
Interventional Radiology Procedure Note  Procedure: Chest port  Indication: Lymphoma  Findings: Please refer to procedural dictation for full description.  Complications: None  EBL: < 10 mL  Tashai Catino, MD [phone]   

## 2020-05-01 ENCOUNTER — Other Ambulatory Visit: Payer: Self-pay | Admitting: Hematology and Oncology

## 2020-05-01 DIAGNOSIS — C8339 Diffuse large B-cell lymphoma, extranodal and solid organ sites: Secondary | ICD-10-CM

## 2020-05-01 NOTE — Progress Notes (Signed)
Shaun Clarke [phone]   [phone]  PROGRESS NOTE  Patient Care Team: Dettinger, Fransisca Kaufmann, MD as PCP - General (Family Medicine)  Hematological/Oncological History # Diffuse Large B Cell Lymphoma, Staging IV  04/07/2020: presented to the ED with MRI findings concerning for extensive osseous metastatic disease including large volume paraspinal and epidural tumor in the mid and lower thoracic spine  04/08/2020: establish care with Dr. Lorenso Courier while inpatient. Scheduled for laminectomy/biopsy of metastatic spine lesion.   04/08/2020: laminectomy performed, biopsy results show DLBCL.   04/26/2020: PET CT scan shows widespread skeletal involvement of the malignancy with numerous Deauville 5 score osseous lesions.  Hypermetabolic lymph nodes in the neck, chest, abdomen and pelvis. 05/04/2020: intended Cycle 1 Day 1 of R-CHOP.   Interval History:  Shaun Clarke 60 y.o. 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 The patient's last visit was on 04/19/2020 at which time we discussed the steps moving forward after we reach a diagnosis of diffuse large B-cell lymphoma. In the interim since the last visit a PET CT scan has been performed which shows widespread disease consistent with stage IV.  On exam today Shaun Clarke is accompanied by his wife.  He reports he has been well in the interim since his last visit.  He has completed his port placement, his PET CT scan, and is currently scheduled for his echocardiogram tomorrow.  He has had no issues with fevers, chills, sweats, nausea, vomiting or diarrhea.  Surgical site has healed well and he is not having any issues with pain.  A full 10 point ROS is listed below.  The bulk of our discussion today focused on any questions or concerns he may have prior to the start of Cycle 1 Day 1 of chemotherapy.   MEDICAL HISTORY:  Past Medical History:  Diagnosis  Date  . GERD (gastroesophageal reflux disease)     SURGICAL HISTORY: Past Surgical History:  Procedure Laterality Date  . APPENDECTOMY    . DECOMPRESSIVE LUMBAR LAMINECTOMY LEVEL 2 N/A 04/08/2020   Procedure: THORACIC FIVE-THORACIC SEVEN OPEN LAMINECTOMY FOR RESECTION OF EPIDURAL TUMOR ;  Surgeon: Karsten Ro, DO;  Location: [facility];  Service: Neurosurgery;  Laterality: N/A;  . IR IMAGING GUIDED PORT INSERTION  04/29/2020  . KNEE ARTHROSCOPY     right knee 3 times  . SHOULDER BIOPSY     right shoulder lipoma removal    SOCIAL HISTORY: Social History   Socioeconomic History  . Marital status: Married    Spouse name: Not on file  . Number of children: 2  . Years of education: Not on file  . Highest education level: Not on file  Occupational History  . Not on file  Tobacco Use  . Smoking status: Current Every Day Smoker    Packs/day: 1.00  . Smokeless tobacco: Never Used  Vaping Use  . Vaping Use: Never used  Substance and Sexual Activity  . Alcohol use: Yes    Alcohol/week: 3.0 standard drinks    Types: 1 Glasses of wine, 2 Cans of beer per week    Comment: per week  . Drug use: Never  . Sexual activity: Yes    Partners: Female  Other Topics Concern  . Not on file  Social History Narrative  . Not on file   Social Determinants of Health   Financial Resource Strain: Not on file  Food Insecurity: Not on file  Transportation Needs: Not on file  Physical Activity: Not on file  Stress: Not on file  Social Connections: Not on file  Intimate Partner Violence: Not on file    FAMILY HISTORY: Family History  Problem Relation Age of Onset  . Pancreatic cancer Mother   . Diabetes Father   . Anxiety disorder Sister   . Multiple sclerosis Sister   . COPD Maternal Grandfather     ALLERGIES:  is allergic to naproxen and gadolinium derivatives.  MEDICATIONS:  Current Outpatient Medications  Medication Sig Dispense Refill  . predniSONE (DELTASONE) 20 MG tablet Take 3  tablets (60 mg total) by mouth as directed. Take 3 tablets in the morning on Day 1 of chemotherapy and for four days after. 15 tablet 3  . acetaminophen (TYLENOL) 500 MG tablet Take 500-1,000 mg by mouth every 6 (six) hours as needed (for pain).    Marland Kitchen allopurinol (ZYLOPRIM) 300 MG tablet Take 1 tablet (300 mg total) by mouth daily. 90 tablet 1  . lidocaine-prilocaine (EMLA) cream Apply 1 application topically as needed. 30 g 0  . methocarbamol (ROBAXIN) 500 MG tablet Take 1 tablet (500 mg total) by mouth every 6 (six) hours as needed for muscle spasms. 30 tablet 0  . ondansetron (ZOFRAN) 8 MG tablet Take 1 tablet (8 mg total) by mouth every 8 (eight) hours as needed for nausea or vomiting. 30 tablet 0  . oxyCODONE-acetaminophen (PERCOCET/ROXICET) 5-325 MG tablet Take 1 tablet by mouth every 4 (four) hours as needed for moderate pain. 30 tablet 0  . pantoprazole (PROTONIX) 40 MG tablet Take 1 tablet (40 mg total) by mouth 2 (two) times daily before a meal. 180 tablet 1  . prochlorperazine (COMPAZINE) 10 MG tablet Take 1 tablet (10 mg total) by mouth every 6 (six) hours as needed for nausea or vomiting. 30 tablet 0  . sucralfate (CARAFATE) 1 g tablet Take 1 g by mouth 2 (two) times daily. (Patient not taking: No sig reported)     No current facility-administered medications for this visit.    REVIEW OF SYSTEMS:   Constitutional: ( - ) fevers, ( - )  chills , ( - ) night sweats Eyes: ( - ) blurriness of vision, ( - ) double vision, ( - ) watery eyes Ears, nose, mouth, throat, and face: ( - ) mucositis, ( - ) sore throat Respiratory: ( - ) cough, ( - ) dyspnea, ( - ) wheezes Cardiovascular: ( - ) palpitation, ( - ) chest discomfort, ( - ) lower extremity swelling Gastrointestinal:  ( - ) nausea, ( - ) heartburn, ( - ) change in bowel habits Skin: ( - ) abnormal skin rashes Lymphatics: ( - ) new lymphadenopathy, ( - ) easy bruising Neurological: ( - ) numbness, ( - ) tingling, ( - ) new  weaknesses Behavioral/Psych: ( - ) mood change, ( - ) new changes  All other systems were reviewed with the patient and are negative.  PHYSICAL EXAMINATION: ECOG PERFORMANCE STATUS: 1 - Symptomatic but completely ambulatory  Vitals:   05/02/20 0811  BP: 107/62  Pulse: 64  Resp: 18  Temp: (!) 96.8 F (36 C)  SpO2: 97%   Filed Weights   05/02/20 0811  Weight: 167 lb 14.4 oz (76.2 kg)    GENERAL: well appearing middle aged Caucasian male alert, no distress and comfortable SKIN: skin color, texture, turgor are normal, no rashes or significant lesions EYES: conjunctiva are pink and non-injected, sclera clear LUNGS: clear to  auscultation and percussion with normal breathing effort HEART: regular rate & rhythm and no murmurs and no lower extremity edema Musculoskeletal: no cyanosis of digits and no clubbing  PSYCH: alert & oriented x 3, fluent speech NEURO: no focal motor/sensory deficits  LABORATORY DATA:  I have reviewed the data as listed CBC Latest Ref Rng & Units 05/02/2020 04/15/2020 04/11/2020  WBC 4.0 - 10.5 K/uL 4.1 8.7 13.7(H)  Hemoglobin 13.0 - 17.0 g/dL 14.0 14.2 12.3(L)  Hematocrit 39.0 - 52.0 % 42.9 43.0 36.8(L)  Platelets 150 - 400 K/uL 286 275 221    CMP Latest Ref Rng & Units 05/02/2020 04/15/2020 04/11/2020  Glucose 70 - 99 mg/dL 90 105(H) 117(H)  BUN 6 - 20 mg/dL 18 22(H) 15  Creatinine 0.61 - 1.24 mg/dL 0.94 0.95 0.88  Sodium 135 - 145 mmol/L 142 141 139  Potassium 3.5 - 5.1 mmol/L 4.3 4.3 3.9  Chloride 98 - 111 mmol/L 105 105 108  CO2 22 - 32 mmol/L 26 24 25   Calcium 8.9 - 10.3 mg/dL 9.5 8.6(L) 8.6(L)  Total Protein 6.5 - 8.1 g/dL 6.7 6.3(L) -  Total Bilirubin 0.3 - 1.2 mg/dL 0.5 0.3 -  Alkaline Phos 38 - 126 U/L 79 68 -  AST 15 - 41 U/L 18 18 -  ALT 0 - 44 U/L 27 40 -    Lab Results  Component Value Date   MPROTEIN Not Observed 04/07/2020   Lab Results  Component Value Date   KPAFRELGTCHN 11.4 04/07/2020   LAMBDASER 9.0 04/07/2020   KAPLAMBRATIO  1.27 04/07/2020     RADIOGRAPHIC STUDIES: CT Chest W Contrast  Result Date: 04/07/2020 CLINICAL DATA:  Cancer of unknown primary, osseous metastatic disease on MRI of the thoracic spine EXAM: CT CHEST WITH CONTRAST TECHNIQUE: Multidetector CT imaging of the chest was performed during intravenous contrast administration. CONTRAST:  37mL OMNIPAQUE IOHEXOL 350 MG/ML SOLN COMPARISON:  04/06/2020, 04/01/2020, 03/17/2020 FINDINGS: Cardiovascular: The heart and great vessels are unremarkable without pericardial effusion. Minimal atherosclerosis of the aortic arch. No aneurysm or dissection. Mediastinum/Nodes: No enlarged mediastinal, hilar, or axillary lymph nodes. Thyroid gland, trachea, and esophagus demonstrate no significant findings. Lungs/Pleura: Areas of linear consolidation within the bilateral lower lobes most consistent with atelectasis or scarring. Mild background emphysema, upper lobe predominant, without superimposed airspace disease, effusion, or pneumothorax. Indeterminate 6 mm left upper lobe nodule image 107/7. No other nodules or masses. Upper Abdomen: Persistent mural. Gastric malignancy cannot thickening of the gastric fundus be excluded given the previous MRI findings. There is an enlarged epiphrenic lymph node on the right, measuring 12 mm in short axis reference image 125 of series 3. Musculoskeletal: Paraspinal soft tissue masses are seen from T5 through T10, compatible with metastatic disease seen on earlier MRI. The epidural metastatic disease and diffuse bony metastases seen on MRI are less apparent by CT. There are no acute displaced fractures. Reconstructed images demonstrate no additional findings. IMPRESSION: 1. Thoracic paraspinal soft tissue masses consistent with metastatic disease seen on preceding MRI. Please refer to MRI report detailing thoracic osseous metastases, paraspinal metastatic disease, as well as epidural extension of tumor. 2. Persistent mural thickening of the  gastric fundus. Gastric malignancy cannot be excluded given the thoracic spine findings. Endoscopy may be useful. 3. Indeterminate left upper lobe 6 mm pulmonary nodule. Close attention on follow-up is recommended. 4. Enlarged right epiphrenic lymph node, concerning for metastatic disease. 5. Aortic Atherosclerosis (ICD10-I70.0) and Emphysema (ICD10-J43.9). Electronically Signed   By: Diana Eves.D.  On: 04/07/2020 19:29   MR BRAIN W WO CONTRAST  Result Date: 04/10/2020 CLINICAL DATA:  Epidural tumor in the midthoracic spine with osseous metastases. Unknown primary. Patient does have a lung nodule. EXAM: MRI HEAD WITHOUT AND WITH CONTRAST TECHNIQUE: Multiplanar, multiecho pulse sequences of the brain and surrounding structures were obtained without and with intravenous contrast. CONTRAST:  7.44mL GADAVIST GADOBUTROL 1 MMOL/ML IV SOLN COMPARISON:  MRI of the thoracic spine without and with contrast 04/10/2020 FINDINGS: Brain: No acute infarct, hemorrhage, or mass lesion is present. No significant white matter lesions are present. The ventricles are of normal size. No significant extraaxial fluid collection is present. Basal ganglia are within normal limits. The internal auditory canals are within normal limits. The brainstem and cerebellum are within normal limits. Postcontrast images demonstrate no pathologic enhancement. Vascular: Flow is present in the major intracranial arteries. Skull and upper cervical spine: Osseous metastases are present at C2 and C3. There is heterogeneous enhancement of the calvarium consistent with osseous metastases. Spinal canal is within normal limits. Empty and enlarged sella noted. Sinuses/Orbits: The paranasal sinuses and mastoid air cells are clear. The globes and orbits are within normal limits. IMPRESSION: 1. Normal MRI appearance of the brain. 2. Osseous metastases to the calvarium and upper cervical spine. 3. Empty and enlarged sella. This is nonspecific, but can be seen  in the setting of idiopathic intracranial hypertension. Electronically Signed   By: Marin Roberts M.D.   On: 04/10/2020 13:49   MR Thoracic Spine Wo Contrast  Result Date: 04/06/2020 CLINICAL DATA:  Back pain and weakness in the legs with difficulty walking. EXAM: MRI THORACIC SPINE WITHOUT CONTRAST TECHNIQUE: Multiplanar, multisequence MR imaging of the thoracic spine was performed. No intravenous contrast was administered. COMPARISON:  Thoracic spine radiographs 02/29/2020 FINDINGS: Alignment:  Normal. Vertebrae: Multiple T1 hypointense, STIR hyperintense lesions throughout the thoracic spine including prominent involvement of the vertebral bodies and posterior elements from T6-T9. Large volume epidural tumor dorsally and laterally in the spinal canal from T5-T8 measuring up to 1 cm in AP thickness and resulting in severe spinal stenosis with complete CSF effacement and moderate cord flattening. Extraosseous tumor anterior and lateral to the vertebral bodies from T6-T10. No fracture. Small Schmorl's nodes along the T6 and T7 inferior endplates. Suspected osseous metastatic disease in the cervical spine on large field of view sagittal counting images (C2 and C3 vertebral bodies and left C4 posterior elements), not well evaluated. Cord:  No cord edema. Paraspinal and other soft tissues: Paraspinal tumor as above. Disc levels: Severe spinal stenosis in the midthoracic spine due to epidural tumor as above. Mild thoracic spondylosis without evidence of significant stenosis elsewhere. IMPRESSION: Extensive osseous metastatic disease including large volume paraspinal and epidural tumor in the mid and lower thoracic spine. Epidural tumor results in severe spinal stenosis and cord flattening without evidence of cord edema. These results will be called to the ordering clinician or representative by the Radiologist Assistant, and communication documented in the PACS or [employer]. Electronically Signed   By:  Sebastian Ache M.D.   On: 04/06/2020 17:38   MR THORACIC SPINE W WO CONTRAST  Result Date: 04/10/2020 CLINICAL DATA:  Osseous metastatic disease with epidural tumor status post thoracic laminectomy 2 days ago. EXAM: MRI THORACIC WITHOUT AND WITH CONTRAST TECHNIQUE: Multiplanar and multiecho pulse sequences of the thoracic spine were obtained without and with intravenous contrast. CONTRAST:  7.47mL GADAVIST GADOBUTROL 1 MMOL/ML IV SOLN COMPARISON:  Thoracic spine radiographs 04/08/2020. Chest CT 04/07/2020  and thoracic MRI 04/06/2020. FINDINGS: Alignment:  Normal. Vertebrae: Patient has undergone interval wide decompressive laminectomies at T5, T6 and T7. Multifocal osseous metastatic disease is again noted, most prominent within the T6, T7, T8 and T9 vertebral bodies. No evidence of pathologic fracture. Cord: Resolution of previously demonstrated cord compression in the midthoracic spine status post decompressive laminectomy and epidural tumor resection. No abnormal intradural enhancement. Paraspinal and other soft tissues: Multifocal paraspinal tumor again noted, most prominent on the left from T6 through T9. Associated metastases within the ribs. As above, interval debridement of posterior epidural tumor in the spinal canal. No new paraspinal abnormalities. Increased atelectasis at both lung bases. Disc levels: No significant disc pathology. No residual cord deformity or significant foraminal compromise. IMPRESSION: 1. Interval decompressive laminectomies from T5 through T7 with epidural tumor resection and resolved cord compression. 2. No demonstrated postoperative complications. 3. Otherwise stable multifocal osseous metastatic disease with paraspinal tumor asymmetric to the left. No acute findings. Electronically Signed   By: Richardean Sale M.D.   On: 04/10/2020 13:49   DG Thoracic Spine 1 View  Result Date: 04/08/2020 CLINICAL DATA:  Surgery, elective. Additional history provided: T5-7 laminectomy. EXAM:  OPERATIVE THORACIC SPINE single VIEW. COMPARISON:  Chest CT 04/07/2020.  Thoracic spine MRI 04/06/2020. FINDINGS: A single AP intraoperative fluoroscopic image of the thoracic spine is submitted. The image demonstrates a metallic site marker projecting over the C5 vertebral body. Overlying retractors. Partially visualized support tubes. IMPRESSION: Single AP intraoperative fluoroscopic image of the thoracic spine with metallic site marker projecting over the T5 vertebral body, as described. Electronically Signed   By: Kellie Simmering DO   On: 04/08/2020 15:38   NM PET Image Initial (PI) Skull Base To Thigh  Result Date: 04/26/2020 CLINICAL DATA:  Initial treatment strategy for B-cell lymphoma. Prior midthoracic epidural tumor resection and known osseous metastatic disease. EXAM: NUCLEAR MEDICINE PET SKULL VERTEX TO THIGH TECHNIQUE: 8.1 mCi F-18 FDG was injected intravenously. Full-ring PET imaging was performed from the skull vertex to thigh after the radiotracer. CT data was obtained and used for attenuation correction and anatomic localization. Fasting blood glucose: 94 mg/dl COMPARISON:  Multiple exams, including 04/01/2020 CT abdomen FINDINGS: Mediastinal blood pool activity: SUV max 1.8 Liver activity: SUV max 2.9 HEAD/NECK: Small but hypermetabolic bilateral level IIb lymph nodes are present along with left eccentric small hypermetabolic suboccipital lymph nodes. Index left level IIb lymph node measuring 0.6 cm in short axis on image 49 series 4 has maximum SUV of 4.7, Deauville 4. Hypermetabolic activity in both palatine tonsils noted, maximum SUV on the left 13.1 and on the right 12.0, both Deauville 5. A hypermetabolic but indistinctly marginated lesion in the left parotid gland measuring about 1.2 cm in diameter has a maximum SUV of 10.4, Deauville 5. A small hypermetabolic lymph node just below the left parotid gland has maximum SUV of 9.9, Deauville 5. Posterior nasopharyngeal activity with maximum SUV  9.0, Deauville 5. Incidental CT findings: none CHEST: Hypermetabolic right pericardial lymph node 0.8 cm in short axis on image 122 of series 4, maximum SUV 8.6, Deauville 5. Notable paraspinal tumor is observed extending from the T5-6 vertebral level to the T10 vertebral level. At the T9-10 level, this tumor rind measures 1.8 cm in short axis on image 114 of series 4 with maximum SUV 11.0, Deauville 5. This previously measured 1.9 cm in thickness on 04/07/2020. Small mildly hypermetabolic axillary and left subpectoral lymph nodes are present. A left subpectoral node measuring 0.6 cm  in short axis on image 74 series 4 has a maximum SUV of 4.3, Deauville 4. Low-grade postoperative metabolic activity along the midthoracic laminectomy site and adjacent midline postoperative region. Incidental CT findings: Atherosclerotic calcification of the aortic arch. Bandlike atelectasis or scarring in both lower lobes. ABDOMEN/PELVIS: Physiologic activity in bowel. Left common iliac node 1.7 cm in short axis on image 173 of series 4 (formerly 1.8 cm on 04/01/2020) with maximum SUV 11.0, Deauville 5. Small hypermetabolic bilateral external iliac and inguinal lymph nodes. Index left inguinal lymph node 1.0 cm in short axis on image 214 of series 4 (formerly the same) with maximum SUV 14.0, Deauville 5. Incidental CT findings: Sigmoid colon diverticulosis. SKELETON: Widespread hypermetabolic skeletal lesions with little in the way of corresponding CT abnormality, throughout the visualized skeleton to include the skull base, spine, proximal humerus bilaterally, both scapula, sternum, ribs, bony pelvis, and proximal femurs. Index lesion anteriorly in the L3 vertebral body with maximum SUV 11.4, Deauville 5. Index lesion posteriorly in the right iliac bone maximum SUV 10.6 comment over L5. Index lesion in the left proximal femoral shaft, maximum SUV 8.2, Deauville 5. Incidental CT findings: Bridging spurring of the left sacroiliac  joint. IMPRESSION: 1. Widespread skeletal involvement of malignancy with numerous Deauville 5 scattered osseous lesions without substantial associated CT abnormality. 2. Hypermetabolic lymph nodes in the neck, chest, and abdomen/pelvis are primarily Deauville 5 with some Deauville 4. Many of these nodes are small or normal in size but hypermetabolic. Few of the nodes are abnormally enlarged, for example the left common iliac node measuring 1.7 cm in short axis. 3. Hypermetabolic activity in the left parotid gland could represent a hypermetabolic parotid lymph node or a primary parotid neoplasm. 4. Hypermetabolic activity in the posterior nasopharynx and in the palatine tonsils (Deauville 5). 5. Paraspinal tumor in the lower thorax along the pleural margin. Prior midthoracic laminectomy recently. 6. Other imaging findings of potential clinical significance: Aortic Atherosclerosis (ICD10-I70.0). Mild atelectasis in both lower lobes. Sigmoid colon diverticulosis. Electronically Signed   By: Van Clines M.D.   On: 04/26/2020 15:19   DG C-Arm 1-60 Min  Result Date: 04/08/2020 CLINICAL DATA:  Surgery, elective. Additional history provided: T5-7 laminectomy. EXAM: OPERATIVE THORACIC SPINE single VIEW. COMPARISON:  Chest CT 04/07/2020.  Thoracic spine MRI 04/06/2020. FINDINGS: A single AP intraoperative fluoroscopic image of the thoracic spine is submitted. The image demonstrates a metallic site marker projecting over the C5 vertebral body. Overlying retractors. Partially visualized support tubes. IMPRESSION: Single AP intraoperative fluoroscopic image of the thoracic spine with metallic site marker projecting over the T5 vertebral body, as described. Electronically Signed   By: Kellie Simmering DO   On: 04/08/2020 15:38   IR IMAGING GUIDED PORT INSERTION  Result Date: 04/29/2020 INDICATION: Diffuse large B-cell lymphoma EXAM: IMPLANTED PORT A CATH PLACEMENT WITH ULTRASOUND AND FLUOROSCOPIC GUIDANCE MEDICATIONS:  None ANESTHESIA/SEDATION: Moderate (conscious) sedation was employed during this procedure. A total of Versed 2 mg and Fentanyl 100 mcg was administered intravenously. Moderate Sedation Time: 17 minutes. The patient's level of consciousness and vital signs were monitored continuously by radiology nursing throughout the procedure under my direct supervision. FLUOROSCOPY TIME:  0 minutes, 24 seconds (4 mGy) COMPLICATIONS: None immediate. PROCEDURE: The procedure, risks, benefits, and alternatives were explained to the patient. Questions regarding the procedure were encouraged and answered. The patient understands and consents to the procedure. A timeout was performed prior to the initiation of the procedure. Patient positioned supine on the angiography table. Right neck  and anterior upper chest prepped and draped in the usual sterile fashion. All elements of maximal sterile barrier were utilized including, cap, mask, sterile gown, sterile gloves, large sterile drape, hand scrubbing and 2% Chlorhexidine for skin cleaning. The right internal jugular vein was evaluated with ultrasound and shown to be patent. A permanent ultrasound image was obtained and placed in the patient's medical record. Local anesthesia was provided with 1% lidocaine with epinephrine. Using sterile gel and a sterile probe cover, the right internal jugular vein was entered with a 21 ga needle during real time ultrasound guidance. 0.018 inch guidewire placed and 21 ga needle exchanged for transitional dilator set. Utilizing fluoroscopy, 0.035 inch guidewire advanced through the needle without difficulty. Attention then turned to the right anterior upper chest. Following local lidocaine administration, a port pocket was created. The catheter was connected to the port and brought from the pocket to the venotomy site through a subcutaneous tunnel. The catheter was cut to size and inserted through the peel-away sheath. The catheter tip was positioned at  the cavoatrial junction using fluoroscopic guidance. The port aspirated and flushed well. The port pocket was closed with deep and superficial absorbable suture. The port pocket incision and venotomy sites were also sealed with Dermabond. IMPRESSION: Successful placement of a right internal jugular approach power injectable Port-A-Cath. The catheter is ready for immediate use. Electronically Signed   By: Miachel Roux M.D.   On: 04/29/2020 10:29    ASSESSMENT & PLAN Shaun Clarke 60 y.o. 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  After review the labs, the records, schedule the patient the findings most consistent with a diffuse large B-cell lymphoma with staging in process.  We completed the staging with a PET CT scan which showed his disease does appear as higher stage (III or IV). At this time we will plan for at least 6 cycles of R-CHOP followed by repeat scans.   Previously we discussed the risks and benefits of R-CHOP chemotherapy.  We noted the nature of diffuse large B-cell lymphoma and the possibility of achieving a complete remission with this chemotherapy regimen.  I also noted the side effects which include but are not limited to nausea, vomiting, hair loss, diarrhea, sedation, insomnia, hyperglycemia, high blood pressure, and cardiotoxicity.  The patient voices understanding of the risk and benefits and was agreeable to proceeding with treatment at this time.  The R-CHOP regimen consists of rituximab 375 mg per metered squared IV on day 1, cyclophosphamide 750 mg per metered squared on day 1, doxorubicin 50 mg/m on day 1, vincristine 1.4 mg per metered squared with a max dose of 2 mg on day 1, and prednisone 100 mg orally days 1 through 5.  A cycle lasts for 21 days and number of cycles planned will depend on the stage as determined by the PET CT scan.  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  Given his metastatic disease in the spine he is at least a stage III or IV. -- PET CT scan confirms Stage IV disease.  --Patient is recovering well from his neurosurgical procedure --FISH testing negative for double HIT, I would recommend we proceed with R-CHOP chemotherapy as noted above. Ideally this would be started on 05/04/2020 --patient will require an echocardiogram  prior to start. This is scheduled for tomorrow.  --Plan for patient return to clinic prior to cycle 2 day 1 on 05/23/2020 with interval weekly labs.   #Supportive Care --chemotherapy education completed --port in place --Echocardiogram to be performed tomorrow.  --zofran 8mg  q8H PRN and compazine 10mg  PO q6H for nausea -- allopurinol 300mg  PO daily for TLS prophylaxis -- EMLA cream for port -- no pain medication required at this time.   No orders of the defined types were placed in this encounter.  All questions were answered. The patient knows to call the clinic with any problems, questions or concerns.  A total of more than 30 minutes were spent on this encounter and over half of that time was spent on counseling and coordination of care as outlined above.   Ledell Peoples, MD Department of Hematology/Oncology [place] [facility] Phone: [phone] Pager: [phone] Email: [email or web address]  05/03/2020 10:51 AM

## 2020-05-02 ENCOUNTER — Inpatient Hospital Stay: Payer: 59

## 2020-05-02 ENCOUNTER — Inpatient Hospital Stay (HOSPITAL_BASED_OUTPATIENT_CLINIC_OR_DEPARTMENT_OTHER): Payer: 59 | Admitting: Hematology and Oncology

## 2020-05-02 ENCOUNTER — Encounter: Payer: Self-pay | Admitting: Family Medicine

## 2020-05-02 ENCOUNTER — Other Ambulatory Visit: Payer: Self-pay

## 2020-05-02 ENCOUNTER — Encounter: Payer: Self-pay | Admitting: Hematology and Oncology

## 2020-05-02 VITALS — BP 107/62 | HR 64 | Temp 96.8°F | Resp 18 | Wt 167.9 lb

## 2020-05-02 DIAGNOSIS — Z5111 Encounter for antineoplastic chemotherapy: Secondary | ICD-10-CM | POA: Diagnosis not present

## 2020-05-02 DIAGNOSIS — Z95828 Presence of other vascular implants and grafts: Secondary | ICD-10-CM

## 2020-05-02 DIAGNOSIS — C8339 Diffuse large B-cell lymphoma, extranodal and solid organ sites: Secondary | ICD-10-CM

## 2020-05-02 DIAGNOSIS — K219 Gastro-esophageal reflux disease without esophagitis: Secondary | ICD-10-CM

## 2020-05-02 LAB — CMP (CANCER CENTER ONLY)
ALT: 27 U/L (ref 0–44)
AST: 18 U/L (ref 15–41)
Albumin: 3.8 g/dL (ref 3.5–5.0)
Alkaline Phosphatase: 79 U/L (ref 38–126)
Anion gap: 11 (ref 5–15)
BUN: 18 mg/dL (ref 6–20)
CO2: 26 mmol/L (ref 22–32)
Calcium: 9.5 mg/dL (ref 8.9–10.3)
Chloride: 105 mmol/L (ref 98–111)
Creatinine: 0.94 mg/dL (ref 0.61–1.24)
GFR, Estimated: >60 mL/min (ref >60)
Glucose, Bld: 90 mg/dL (ref 70–99)
Potassium: 4.3 mmol/L (ref 3.5–5.1)
Sodium: 142 mmol/L (ref 135–145)
Total Bilirubin: 0.5 mg/dL (ref 0.3–1.2)
Total Protein: 6.7 g/dL (ref 6.5–8.1)

## 2020-05-02 LAB — CBC WITH DIFFERENTIAL (CANCER CENTER ONLY)
Abs Immature Granulocytes: 0.02 10*3/uL (ref 0.00–0.07)
Basophils Absolute: 0.1 10*3/uL (ref 0.0–0.1)
Basophils Relative: 1 %
Eosinophils Absolute: 0.1 10*3/uL (ref 0.0–0.5)
Eosinophils Relative: 3 %
HCT: 42.9 % (ref 39.0–52.0)
Hemoglobin: 14 g/dL (ref 13.0–17.0)
Immature Granulocytes: 1 %
Lymphocytes Relative: 20 %
Lymphs Abs: 0.8 10*3/uL (ref 0.7–4.0)
MCH: 29 pg (ref 26.0–34.0)
MCHC: 32.6 g/dL (ref 30.0–36.0)
MCV: 88.8 fL (ref 80.0–100.0)
Monocytes Absolute: 0.6 10*3/uL (ref 0.1–1.0)
Monocytes Relative: 15 %
Neutro Abs: 2.5 10*3/uL (ref 1.7–7.7)
Neutrophils Relative %: 60 %
Platelet Count: 286 10*3/uL (ref 150–400)
RBC: 4.83 MIL/uL (ref 4.22–5.81)
RDW: 15 % (ref 11.5–15.5)
WBC Count: 4.1 10*3/uL (ref 4.0–10.5)
nRBC: 0 % (ref 0.0–0.2)

## 2020-05-02 LAB — LACTATE DEHYDROGENASE: LDH: 201 U/L — ABNORMAL HIGH (ref 98–192)

## 2020-05-02 LAB — URIC ACID: Uric Acid, Serum: 7.3 mg/dL (ref 3.7–8.6)

## 2020-05-02 MED ORDER — HEPARIN SOD (PORK) LOCK FLUSH 100 UNIT/ML IV SOLN
500.0000 [IU] | Freq: Once | INTRAVENOUS | Status: AC
  Administered 2020-05-02: 500 [IU] via INTRAVENOUS
  Filled 2020-05-02: qty 5

## 2020-05-02 MED ORDER — SODIUM CHLORIDE 0.9% FLUSH
10.0000 mL | INTRAVENOUS | Status: DC | PRN
  Administered 2020-05-02: 10 mL via INTRAVENOUS
  Filled 2020-05-02: qty 10

## 2020-05-02 MED ORDER — PANTOPRAZOLE SODIUM 40 MG PO TBEC
40.0000 mg | DELAYED_RELEASE_TABLET | Freq: Two times a day (BID) | ORAL | 1 refills | Status: DC
Start: 2020-05-02 — End: 2020-11-09

## 2020-05-02 MED ORDER — PREDNISONE 20 MG PO TABS: 60.0000 mg | ORAL_TABLET | ORAL | 3 refills | Status: DC

## 2020-05-02 NOTE — Patient Instructions (Signed)
Implanted Port Insertion, Care After This sheet gives you information about how to care for yourself after your procedure. Your health care provider may also give you more specific instructions. If you have problems or questions, contact your health care provider. What can I expect after the procedure? After the procedure, it is common to have:  Discomfort at the port insertion site.  Bruising on the skin over the port. This should improve over 3-4 days. Follow these instructions at home: Port care  After your port is placed, you will get a manufacturer's information card. The card has information about your port. Keep this card with you at all times.  Take care of the port as told by your health care provider. Ask your health care provider if you or a family member can get training for taking care of the port at home. A home health care nurse may also take care of the port.  Make sure to remember what type of port you have. Incision care  Follow instructions from your health care provider about how to take care of your port insertion site. Make sure you: ? Wash your hands with soap and water before and after you change your bandage (dressing). If soap and water are not available, use hand sanitizer. ? Change your dressing as told by your health care provider. ? Leave stitches (sutures), skin glue, or adhesive strips in place. These skin closures may need to stay in place for 2 weeks or longer. If adhesive strip edges start to loosen and curl up, you may trim the loose edges. Do not remove adhesive strips completely unless your health care provider tells you to do that.  Check your port insertion site every day for signs of infection. Check for: ? Redness, swelling, or pain. ? Fluid or blood. ? Warmth. ? Pus or a bad smell.      Activity  Return to your normal activities as told by your health care provider. Ask your health care provider what activities are safe for you.  Do not  lift anything that is heavier than 10 lb (4.5 kg), or the limit that you are told, until your health care provider says that it is safe. General instructions  Take over-the-counter and prescription medicines only as told by your health care provider.  Do not take baths, swim, or use a hot tub until your health care provider approves. Ask your health care provider if you may take showers. You may only be allowed to take sponge baths.  Do not drive for 24 hours if you were given a sedative during your procedure.  Wear a medical alert bracelet in case of an emergency. This will tell any health care providers that you have a port.  Keep all follow-up visits as told by your health care provider. This is important. Contact a health care provider if:  You cannot flush your port with saline as directed, or you cannot draw blood from the port.  You have a fever or chills.  You have redness, swelling, or pain around your port insertion site.  You have fluid or blood coming from your port insertion site.  Your port insertion site feels warm to the touch.  You have pus or a bad smell coming from the port insertion site. Get help right away if:  You have chest pain or shortness of breath.  You have bleeding from your port that you cannot control. Summary  Take care of the port as told by your   health care provider. Keep the manufacturer's information card with you at all times.  Change your dressing as told by your health care provider.  Contact a health care provider if you have a fever or chills or if you have redness, swelling, or pain around your port insertion site.  Keep all follow-up visits as told by your health care provider. This information is not intended to replace advice given to you by your health care provider. Make sure you discuss any questions you have with your health care provider. Document Revised: 07/23/2017 Document Reviewed: 07/23/2017 Elsevier Patient Education   2021 Elsevier Inc.  

## 2020-05-02 NOTE — Telephone Encounter (Signed)
Ok to refill? It has already been changed in his chart from oncology.

## 2020-05-03 ENCOUNTER — Ambulatory Visit (HOSPITAL_COMMUNITY)
Admission: RE | Admit: 2020-05-03 | Discharge: 2020-05-03 | Disposition: A | Discharge status: Home / Self Care | Payer: 59 | Source: Ambulatory Visit | Attending: Hematology and Oncology | Admitting: Hematology and Oncology

## 2020-05-03 ENCOUNTER — Encounter: Payer: Self-pay | Admitting: Hematology and Oncology

## 2020-05-03 DIAGNOSIS — Z0189 Encounter for other specified special examinations: Secondary | ICD-10-CM

## 2020-05-03 DIAGNOSIS — C8339 Diffuse large B-cell lymphoma, extranodal and solid organ sites: Secondary | ICD-10-CM

## 2020-05-03 DIAGNOSIS — Z01810 Encounter for preprocedural cardiovascular examination: Secondary | ICD-10-CM | POA: Insufficient documentation

## 2020-05-03 DIAGNOSIS — E785 Hyperlipidemia, unspecified: Secondary | ICD-10-CM | POA: Insufficient documentation

## 2020-05-03 LAB — SURGICAL PATHOLOGY

## 2020-05-03 LAB — ECHOCARDIOGRAM COMPLETE
Area-P 1/2: 2.87 cm2
S' Lateral: 3.3 cm

## 2020-05-03 NOTE — Progress Notes (Signed)
  Echocardiogram 2D Echocardiogram has been performed.  Shaun Clarke 05/03/2020, 9:47 AM

## 2020-05-04 ENCOUNTER — Other Ambulatory Visit: Payer: Self-pay

## 2020-05-04 ENCOUNTER — Other Ambulatory Visit: Payer: Self-pay | Admitting: Hematology

## 2020-05-04 ENCOUNTER — Inpatient Hospital Stay: Payer: 59

## 2020-05-04 ENCOUNTER — Ambulatory Visit: Payer: 59 | Admitting: Family Medicine

## 2020-05-04 VITALS — BP 96/56 | HR 76 | Temp 97.7°F | Resp 16

## 2020-05-04 DIAGNOSIS — C8339 Diffuse large B-cell lymphoma, extranodal and solid organ sites: Secondary | ICD-10-CM

## 2020-05-04 DIAGNOSIS — Z5111 Encounter for antineoplastic chemotherapy: Secondary | ICD-10-CM | POA: Diagnosis not present

## 2020-05-04 MED ORDER — SODIUM CHLORIDE 0.9 % IV SOLN
750.0000 mg/m2 | Freq: Once | INTRAVENOUS | Status: AC
  Administered 2020-05-04: 1420 mg via INTRAVENOUS
  Filled 2020-05-04: qty 71

## 2020-05-04 MED ORDER — SODIUM CHLORIDE 0.9% FLUSH
10.0000 mL | INTRAVENOUS | Status: DC | PRN
  Administered 2020-05-04: 10 mL
  Filled 2020-05-04: qty 10

## 2020-05-04 MED ORDER — DIPHENHYDRAMINE HCL 25 MG PO CAPS
ORAL_CAPSULE | ORAL | Status: AC
  Filled 2020-05-04: qty 2

## 2020-05-04 MED ORDER — ACETAMINOPHEN 325 MG PO TABS
650.0000 mg | ORAL_TABLET | Freq: Once | ORAL | Status: AC
  Administered 2020-05-04: 650 mg via ORAL

## 2020-05-04 MED ORDER — DIPHENHYDRAMINE HCL 25 MG PO CAPS
50.0000 mg | ORAL_CAPSULE | Freq: Once | ORAL | Status: AC
  Administered 2020-05-04: 50 mg via ORAL

## 2020-05-04 MED ORDER — PALONOSETRON HCL INJECTION 0.25 MG/5ML
INTRAVENOUS | Status: AC
  Filled 2020-05-04: qty 5

## 2020-05-04 MED ORDER — DOXORUBICIN HCL CHEMO IV INJECTION 2 MG/ML
50.0000 mg/m2 | Freq: Once | INTRAVENOUS | Status: AC
  Administered 2020-05-04: 94 mg via INTRAVENOUS
  Filled 2020-05-04: qty 47

## 2020-05-04 MED ORDER — SODIUM CHLORIDE 0.9 % IV SOLN
375.0000 mg/m2 | Freq: Once | INTRAVENOUS | Status: AC
  Administered 2020-05-04: 700 mg via INTRAVENOUS
  Filled 2020-05-04: qty 50

## 2020-05-04 MED ORDER — VINCRISTINE SULFATE CHEMO INJECTION 1 MG/ML
2.0000 mg | Freq: Once | INTRAVENOUS | Status: AC
  Administered 2020-05-04: 2 mg via INTRAVENOUS
  Filled 2020-05-04: qty 2

## 2020-05-04 MED ORDER — HEPARIN SOD (PORK) LOCK FLUSH 100 UNIT/ML IV SOLN
500.0000 [IU] | Freq: Once | INTRAVENOUS | Status: AC | PRN
  Administered 2020-05-04: 500 [IU]
  Filled 2020-05-04: qty 5

## 2020-05-04 MED ORDER — SODIUM CHLORIDE 0.9 % IV SOLN
10.0000 mg | Freq: Once | INTRAVENOUS | Status: AC
  Administered 2020-05-04: 10 mg via INTRAVENOUS
  Filled 2020-05-04: qty 10

## 2020-05-04 MED ORDER — SODIUM CHLORIDE 0.9 % IV SOLN
Freq: Once | INTRAVENOUS | Status: AC
Start: 2020-05-04 — End: 2020-05-04
  Filled 2020-05-04: qty 250

## 2020-05-04 MED ORDER — ACETAMINOPHEN 325 MG PO TABS
ORAL_TABLET | ORAL | Status: AC
  Filled 2020-05-04: qty 2

## 2020-05-04 MED ORDER — FOSAPREPITANT DIMEGLUMINE INJECTION 150 MG
150.0000 mg | Freq: Once | INTRAVENOUS | Status: AC
  Administered 2020-05-04: 150 mg via INTRAVENOUS
  Filled 2020-05-04: qty 150

## 2020-05-04 MED ORDER — PALONOSETRON HCL INJECTION 0.25 MG/5ML
0.2500 mg | Freq: Once | INTRAVENOUS | Status: AC
  Administered 2020-05-04: 0.25 mg via INTRAVENOUS

## 2020-05-04 NOTE — Progress Notes (Signed)
PA team working on auth.  Ok to proceed w/ Truxima per Ulice Dash (PA team).  Kennith Center, Pharm.D., CPP 05/04/2020@10 :03 AM

## 2020-05-04 NOTE — Patient Instructions (Signed)
[facility]  Discharge Instructions: Thank you for choosing [facility] to provide your oncology and hematology care.   If you have a lab appointment with the [facility], please go directly to the [facility] and check in at the registration area.   Wear comfortable clothing and clothing appropriate for easy access to any Portacath or PICC line.   We strive to give you quality time with your provider. You may need to reschedule your appointment if you arrive late (15 or more minutes).  Arriving late affects you and other patients whose appointments are after yours.  Also, if you miss three or more appointments without notifying the office, you may be dismissed from the clinic at the provider's discretion.      For prescription refill requests, have your pharmacy contact our office and allow 72 hours for refills to be completed.    Today you received the following chemotherapy and/or immunotherapy agents Doxorubicin, Cyclophosphamide, Vincristine and Rituximab and patient taking Prednisone at home with breakfast     To help prevent nausea and vomiting after your treatment, we encourage you to take your nausea medication as directed on the medicine chart you received in Patient Education session   BELOW ARE SYMPTOMS THAT SHOULD BE REPORTED IMMEDIATELY: . *FEVER GREATER THAN 100.4 F (38 C) OR HIGHER . *CHILLS OR SWEATING . *NAUSEA AND VOMITING THAT IS NOT CONTROLLED WITH YOUR NAUSEA MEDICATION . *UNUSUAL SHORTNESS OF BREATH . *UNUSUAL BRUISING OR BLEEDING . *URINARY PROBLEMS (pain or burning when urinating, or frequent urination) . *BOWEL PROBLEMS (unusual diarrhea, constipation, pain near the anus) . TENDERNESS IN MOUTH AND THROAT WITH OR WITHOUT PRESENCE OF ULCERS (sore throat, sores in mouth, or a toothache) . UNUSUAL RASH, SWELLING OR PAIN  . UNUSUAL VAGINAL DISCHARGE OR ITCHING   Items with * indicate a potential emergency and should be  followed up as soon as possible or go to the Emergency Department if any problems should occur.  Please show the CHEMOTHERAPY ALERT CARD or IMMUNOTHERAPY ALERT CARD at check-in to the Emergency Department and triage nurse.  Should you have questions after your visit or need to cancel or reschedule your appointment, please contact [facility]  Dept: [phone]  and follow the prompts.  Office hours are 8:00 a.m. to 4:30 p.m. Monday - Friday. Please note that voicemails left after 4:00 p.m. may not be returned until the following business day.  We are closed weekends and major holidays. You have access to a nurse at all times for urgent questions. Please call the main number to the clinic Dept: [phone] and follow the prompts.   For any non-urgent questions, you may also contact your provider using MyChart. We now offer e-Visits for anyone 27 and older to request care online for non-urgent symptoms. For details visit [email or web address].   Also download the MyChart app! Go to the app store, search "MyChart", open the app, select [facility], and log in with your MyChart username and password.  Due to Covid, a mask is required upon entering the hospital/clinic. If you do not have a mask, one will be given to you upon arrival. For doctor visits, patients may have 1 support person aged 7 or older with them. For treatment visits, patients cannot have anyone with them due to current Covid guidelines and our immunocompromised population.

## 2020-05-05 ENCOUNTER — Ambulatory Visit (HOSPITAL_COMMUNITY): Payer: 59

## 2020-05-05 ENCOUNTER — Telehealth: Payer: Self-pay | Admitting: *Deleted

## 2020-05-05 ENCOUNTER — Telehealth: Payer: Self-pay | Admitting: Hematology and Oncology

## 2020-05-05 NOTE — Telephone Encounter (Signed)
Scheduled per los. Called and spoke with patient. Confirmed appts  

## 2020-05-06 ENCOUNTER — Other Ambulatory Visit: Payer: Self-pay

## 2020-05-06 ENCOUNTER — Inpatient Hospital Stay: Payer: 59

## 2020-05-06 VITALS — BP 109/67 | HR 65 | Temp 98.2°F | Resp 16

## 2020-05-06 DIAGNOSIS — Z5111 Encounter for antineoplastic chemotherapy: Secondary | ICD-10-CM | POA: Diagnosis not present

## 2020-05-06 DIAGNOSIS — C8339 Diffuse large B-cell lymphoma, extranodal and solid organ sites: Secondary | ICD-10-CM

## 2020-05-06 MED ORDER — PEGFILGRASTIM-CBQV 6 MG/0.6ML ~~LOC~~ SOSY
PREFILLED_SYRINGE | SUBCUTANEOUS | Status: AC
  Filled 2020-05-06: qty 0.6

## 2020-05-06 MED ORDER — PEGFILGRASTIM-CBQV 6 MG/0.6ML ~~LOC~~ SOSY
6.0000 mg | PREFILLED_SYRINGE | Freq: Once | SUBCUTANEOUS | Status: AC
  Administered 2020-05-06: 6 mg via SUBCUTANEOUS

## 2020-05-06 NOTE — Patient Instructions (Signed)
Pegfilgrastim injection What is this medicine? PEGFILGRASTIM (PEG fil gra stim) is a long-acting granulocyte colony-stimulating factor that stimulates the growth of neutrophils, a type of white blood cell important in the body's fight against infection. It is used to reduce the incidence of fever and infection in patients with certain types of cancer who are receiving chemotherapy that affects the bone marrow, and to increase survival after being exposed to high doses of radiation. This medicine may be used for other purposes; ask your health care provider or pharmacist if you have questions. COMMON BRAND NAME(S): Fulphila, Neulasta, Nyvepria, UDENYCA, Ziextenzo What should I tell my health care provider before I take this medicine? They need to know if you have any of these conditions:  kidney disease  latex allergy  ongoing radiation therapy  sickle cell disease  skin reactions to acrylic adhesives (On-Body Injector only)  an unusual or allergic reaction to pegfilgrastim, filgrastim, other medicines, foods, dyes, or preservatives  pregnant or trying to get pregnant  breast-feeding How should I use this medicine? This medicine is for injection under the skin. If you get this medicine at home, you will be taught how to prepare and give the pre-filled syringe or how to use the On-body Injector. Refer to the patient Instructions for Use for detailed instructions. Use exactly as directed. Tell your healthcare provider immediately if you suspect that the On-body Injector may not have performed as intended or if you suspect the use of the On-body Injector resulted in a missed or partial dose. It is important that you put your used needles and syringes in a special sharps container. Do not put them in a trash can. If you do not have a sharps container, call your pharmacist or healthcare provider to get one. Talk to your pediatrician regarding the use of this medicine in children. While this drug  may be prescribed for selected conditions, precautions do apply. Overdosage: If you think you have taken too much of this medicine contact a poison control center or emergency room at once. NOTE: This medicine is only for you. Do not share this medicine with others. What if I miss a dose? It is important not to miss your dose. Call your doctor or health care professional if you miss your dose. If you miss a dose due to an On-body Injector failure or leakage, a new dose should be administered as soon as possible using a single prefilled syringe for manual use. What may interact with this medicine? Interactions have not been studied. This list may not describe all possible interactions. Give your health care provider a list of all the medicines, herbs, non-prescription drugs, or dietary supplements you use. Also tell them if you smoke, drink alcohol, or use illegal drugs. Some items may interact with your medicine. What should I watch for while using this medicine? Your condition will be monitored carefully while you are receiving this medicine. You may need blood work done while you are taking this medicine. Talk to your health care provider about your risk of cancer. You may be more at risk for certain types of cancer if you take this medicine. If you are going to need a MRI, CT scan, or other procedure, tell your doctor that you are using this medicine (On-Body Injector only). What side effects may I notice from receiving this medicine? Side effects that you should report to your doctor or health care professional as soon as possible:  allergic reactions (skin rash, itching or hives, swelling of   the face, lips, or tongue)  back pain  dizziness  fever  pain, redness, or irritation at site where injected  pinpoint red spots on the skin  red or dark-brown urine  shortness of breath or breathing problems  stomach or side pain, or pain at the shoulder  swelling  tiredness  trouble  passing urine or change in the amount of urine  unusual bruising or bleeding Side effects that usually do not require medical attention (report to your doctor or health care professional if they continue or are bothersome):  bone pain  muscle pain This list may not describe all possible side effects. Call your doctor for medical advice about side effects. You may report side effects to FDA at 1-800-FDA-1088. Where should I keep my medicine? Keep out of the reach of children. If you are using this medicine at home, you will be instructed on how to store it. Throw away any unused medicine after the expiration date on the label. NOTE: This sheet is a summary. It may not cover all possible information. If you have questions about this medicine, talk to your doctor, pharmacist, or health care provider.  2021 Elsevier/Gold Standard (2019-01-16 13:20:51)  

## 2020-05-09 ENCOUNTER — Inpatient Hospital Stay: Payer: 59

## 2020-05-09 ENCOUNTER — Other Ambulatory Visit: Payer: 59

## 2020-05-11 ENCOUNTER — Other Ambulatory Visit: Payer: Self-pay

## 2020-05-11 ENCOUNTER — Telehealth: Payer: Self-pay | Admitting: *Deleted

## 2020-05-11 ENCOUNTER — Encounter: Payer: Self-pay | Admitting: Hematology

## 2020-05-11 ENCOUNTER — Inpatient Hospital Stay: Payer: 59 | Attending: Hematology and Oncology

## 2020-05-11 DIAGNOSIS — Z5189 Encounter for other specified aftercare: Secondary | ICD-10-CM | POA: Insufficient documentation

## 2020-05-11 DIAGNOSIS — Z5112 Encounter for antineoplastic immunotherapy: Secondary | ICD-10-CM | POA: Insufficient documentation

## 2020-05-11 DIAGNOSIS — Z5111 Encounter for antineoplastic chemotherapy: Secondary | ICD-10-CM | POA: Insufficient documentation

## 2020-05-11 DIAGNOSIS — F17210 Nicotine dependence, cigarettes, uncomplicated: Secondary | ICD-10-CM | POA: Insufficient documentation

## 2020-05-11 DIAGNOSIS — C8339 Diffuse large B-cell lymphoma, extranodal and solid organ sites: Secondary | ICD-10-CM | POA: Insufficient documentation

## 2020-05-11 DIAGNOSIS — Z7952 Long term (current) use of systemic steroids: Secondary | ICD-10-CM | POA: Diagnosis not present

## 2020-05-11 DIAGNOSIS — C7951 Secondary malignant neoplasm of bone: Secondary | ICD-10-CM | POA: Insufficient documentation

## 2020-05-11 DIAGNOSIS — Z79899 Other long term (current) drug therapy: Secondary | ICD-10-CM | POA: Insufficient documentation

## 2020-05-11 LAB — CBC WITH DIFFERENTIAL (CANCER CENTER ONLY)
Abs Immature Granulocytes: 0.01 10*3/uL (ref 0.00–0.07)
Basophils Absolute: 0 10*3/uL (ref 0.0–0.1)
Basophils Relative: 2 %
Eosinophils Absolute: 0.1 10*3/uL (ref 0.0–0.5)
Eosinophils Relative: 5 %
HCT: 39.3 % (ref 39.0–52.0)
Hemoglobin: 13 g/dL (ref 13.0–17.0)
Immature Granulocytes: 1 %
Lymphocytes Relative: 55 %
Lymphs Abs: 0.5 10*3/uL — ABNORMAL LOW (ref 0.7–4.0)
MCH: 29.5 pg (ref 26.0–34.0)
MCHC: 33.1 g/dL (ref 30.0–36.0)
MCV: 89.1 fL (ref 80.0–100.0)
Monocytes Absolute: 0.1 10*3/uL (ref 0.1–1.0)
Monocytes Relative: 10 %
Neutro Abs: 0.3 10*3/uL — CL (ref 1.7–7.7)
Neutrophils Relative %: 27 %
Platelet Count: 183 10*3/uL (ref 150–400)
RBC: 4.41 MIL/uL (ref 4.22–5.81)
RDW: 14 % (ref 11.5–15.5)
WBC Count: 1 10*3/uL — ABNORMAL LOW (ref 4.0–10.5)
nRBC: 0 % (ref 0.0–0.2)

## 2020-05-11 LAB — CMP (CANCER CENTER ONLY)
ALT: 43 U/L (ref 0–44)
AST: 20 U/L (ref 15–41)
Albumin: 3.5 g/dL (ref 3.5–5.0)
Alkaline Phosphatase: 100 U/L (ref 38–126)
Anion gap: 7 (ref 5–15)
BUN: 16 mg/dL (ref 6–20)
CO2: 27 mmol/L (ref 22–32)
Calcium: 8.6 mg/dL — ABNORMAL LOW (ref 8.9–10.3)
Chloride: 107 mmol/L (ref 98–111)
Creatinine: 0.79 mg/dL (ref 0.61–1.24)
GFR, Estimated: >60 mL/min (ref >60)
Glucose, Bld: 110 mg/dL — ABNORMAL HIGH (ref 70–99)
Potassium: 3.9 mmol/L (ref 3.5–5.1)
Sodium: 141 mmol/L (ref 135–145)
Total Bilirubin: 0.4 mg/dL (ref 0.3–1.2)
Total Protein: 6 g/dL — ABNORMAL LOW (ref 6.5–8.1)

## 2020-05-11 LAB — LACTATE DEHYDROGENASE: LDH: 165 U/L (ref 98–192)

## 2020-05-11 LAB — URIC ACID: Uric Acid, Serum: 3.8 mg/dL (ref 3.7–8.6)

## 2020-05-11 NOTE — Progress Notes (Signed)
I also gave patient application for [place] whom assists patients financially whom live in [place] only.

## 2020-05-11 NOTE — Progress Notes (Signed)
Enrolled patient in Patient Aid Program via LLS portal.  Patient approved for one-time $100 stipend to help offset expenses. He will receive in 7-10 business days.  A copy of the letter will be mailed to his home. I have a copy available as well to provide.  He has my card for any additional financial questions or concerns.

## 2020-05-11 NOTE — Telephone Encounter (Signed)
CRITICAL VALUE STICKER  CRITICAL VALUE:  WBC 1.0  ANC 0.3  RECEIVER (on-site recipient of call): Drucie Ip, RN  [place] NOTIFIED: 05/11/20 9:45 am  MESSENGER (representative from lab): Ulice Dash  MD NOTIFIED: [place]: 9:50 am  RESPONSE:

## 2020-05-11 NOTE — Progress Notes (Signed)
Met with patient/accompanying adult at registration to introduce myself as Financial Resource Specialist and to offer available resources.  Discussed one-time $1000 [employer] grant and qualifications to assist with personal expenses while going through treatment.  Gave him my card if interested in applying and for any additional financial questions or concerns. 

## 2020-05-16 ENCOUNTER — Inpatient Hospital Stay: Payer: 59

## 2020-05-16 ENCOUNTER — Other Ambulatory Visit: Payer: 59

## 2020-05-16 ENCOUNTER — Encounter: Payer: Self-pay | Admitting: Hematology and Oncology

## 2020-05-16 ENCOUNTER — Other Ambulatory Visit: Payer: Self-pay

## 2020-05-16 DIAGNOSIS — C8339 Diffuse large B-cell lymphoma, extranodal and solid organ sites: Secondary | ICD-10-CM

## 2020-05-16 DIAGNOSIS — Z95828 Presence of other vascular implants and grafts: Secondary | ICD-10-CM | POA: Insufficient documentation

## 2020-05-16 DIAGNOSIS — Z5111 Encounter for antineoplastic chemotherapy: Secondary | ICD-10-CM | POA: Diagnosis not present

## 2020-05-16 LAB — CBC WITH DIFFERENTIAL (CANCER CENTER ONLY)
Abs Immature Granulocytes: 0.68 10*3/uL — ABNORMAL HIGH (ref 0.00–0.07)
Basophils Absolute: 0.1 10*3/uL (ref 0.0–0.1)
Basophils Relative: 1 %
Eosinophils Absolute: 0 10*3/uL (ref 0.0–0.5)
Eosinophils Relative: 0 %
HCT: 40.3 % (ref 39.0–52.0)
Hemoglobin: 13.3 g/dL (ref 13.0–17.0)
Immature Granulocytes: 9 %
Lymphocytes Relative: 15 %
Lymphs Abs: 1.1 10*3/uL (ref 0.7–4.0)
MCH: 29.2 pg (ref 26.0–34.0)
MCHC: 33 g/dL (ref 30.0–36.0)
MCV: 88.6 fL (ref 80.0–100.0)
Monocytes Absolute: 0.9 10*3/uL (ref 0.1–1.0)
Monocytes Relative: 12 %
Neutro Abs: 4.5 10*3/uL (ref 1.7–7.7)
Neutrophils Relative %: 63 %
Platelet Count: 174 10*3/uL (ref 150–400)
RBC: 4.55 MIL/uL (ref 4.22–5.81)
RDW: 15 % (ref 11.5–15.5)
WBC Count: 7.3 10*3/uL (ref 4.0–10.5)
nRBC: 0.6 % — ABNORMAL HIGH (ref 0.0–0.2)

## 2020-05-16 LAB — CMP (CANCER CENTER ONLY)
ALT: 43 U/L (ref 0–44)
AST: 20 U/L (ref 15–41)
Albumin: 3.7 g/dL (ref 3.5–5.0)
Alkaline Phosphatase: 109 U/L (ref 38–126)
Anion gap: 11 (ref 5–15)
BUN: 12 mg/dL (ref 6–20)
CO2: 25 mmol/L (ref 22–32)
Calcium: 9 mg/dL (ref 8.9–10.3)
Chloride: 105 mmol/L (ref 98–111)
Creatinine: 0.92 mg/dL (ref 0.61–1.24)
GFR, Estimated: >60 mL/min (ref >60)
Glucose, Bld: 112 mg/dL — ABNORMAL HIGH (ref 70–99)
Potassium: 4.3 mmol/L (ref 3.5–5.1)
Sodium: 141 mmol/L (ref 135–145)
Total Bilirubin: <0.2 mg/dL — ABNORMAL LOW (ref 0.3–1.2)
Total Protein: 6.5 g/dL (ref 6.5–8.1)

## 2020-05-16 LAB — URIC ACID: Uric Acid, Serum: 4.7 mg/dL (ref 3.7–8.6)

## 2020-05-16 LAB — LACTATE DEHYDROGENASE: LDH: 218 U/L — ABNORMAL HIGH (ref 98–192)

## 2020-05-16 MED ORDER — SODIUM CHLORIDE 0.9% FLUSH
10.0000 mL | Freq: Once | INTRAVENOUS | Status: AC
  Administered 2020-05-16: 10 mL
  Filled 2020-05-16: qty 10

## 2020-05-16 MED ORDER — HEPARIN SOD (PORK) LOCK FLUSH 100 UNIT/ML IV SOLN
500.0000 [IU] | Freq: Once | INTRAVENOUS | Status: AC
  Administered 2020-05-16: 500 [IU]
  Filled 2020-05-16: qty 5

## 2020-05-16 NOTE — Progress Notes (Signed)
Met with patient at registration and gave copy of approval letter for [employer] stipend. He was appreciative.  He has my card for any additional financial questions or concerns.

## 2020-05-18 NOTE — Progress Notes (Signed)
Rapid Infusion Rituximab Pharmacist Evaluation  Shaun Clarke is a 60 y.o. male being treated with rituximab for DLBCL. This patient may be considered for RIR.   A pharmacist has verified the patient tolerated rituximab infusions per the [facility] standard infusion protocol without grade 3-4 infusion reactions. The treatment plan will be updated to reflect RIR if the patient qualifies per the checklist below:   Age > 49 years old Yes   Clinically significant cardiovascular disease No   Circulating lymphocyte count < 5000/uL prior to cycle two Yes  Lab Results  Component Value Date   LYMPHSABS 1.1 05/16/2020    Prior documented grade 3-4 infusion reaction to rituximab No   Prior documented grade 1-2 infusion reaction to rituximab (If YES, Pharmacist will confirm with Physician if patient is still a candidate for RIR) No   Previous rituximab infusion within the past 6 months Yes   Treatment Plan updated orders to reflect RIR Yes    Shaun Clarke does meet the criteria for Rapid Infusion Rituximab. This patient is going to be switched to rapid infusion rituximab.    Kennith Center, Pharm.D., CPP 05/18/2020@1 :37 PM

## 2020-05-23 ENCOUNTER — Other Ambulatory Visit: Payer: 59

## 2020-05-23 ENCOUNTER — Other Ambulatory Visit: Payer: Self-pay

## 2020-05-23 ENCOUNTER — Inpatient Hospital Stay: Payer: 59

## 2020-05-23 ENCOUNTER — Inpatient Hospital Stay (HOSPITAL_BASED_OUTPATIENT_CLINIC_OR_DEPARTMENT_OTHER): Payer: 59 | Admitting: Hematology and Oncology

## 2020-05-23 ENCOUNTER — Encounter: Payer: Self-pay | Admitting: Hematology and Oncology

## 2020-05-23 VITALS — BP 108/58 | HR 76 | Temp 97.6°F | Resp 18

## 2020-05-23 VITALS — BP 114/61 | HR 65 | Temp 97.8°F | Resp 17 | Ht 68.0 in | Wt 173.6 lb

## 2020-05-23 DIAGNOSIS — C8339 Diffuse large B-cell lymphoma, extranodal and solid organ sites: Secondary | ICD-10-CM | POA: Diagnosis not present

## 2020-05-23 DIAGNOSIS — Z5112 Encounter for antineoplastic immunotherapy: Secondary | ICD-10-CM

## 2020-05-23 DIAGNOSIS — Z95828 Presence of other vascular implants and grafts: Secondary | ICD-10-CM

## 2020-05-23 DIAGNOSIS — Z5111 Encounter for antineoplastic chemotherapy: Secondary | ICD-10-CM | POA: Diagnosis not present

## 2020-05-23 LAB — CBC WITH DIFFERENTIAL (CANCER CENTER ONLY)
Abs Immature Granulocytes: 0.03 10*3/uL (ref 0.00–0.07)
Basophils Absolute: 0.1 10*3/uL (ref 0.0–0.1)
Basophils Relative: 1 %
Eosinophils Absolute: 0 10*3/uL (ref 0.0–0.5)
Eosinophils Relative: 0 %
HCT: 41 % (ref 39.0–52.0)
Hemoglobin: 13.6 g/dL (ref 13.0–17.0)
Immature Granulocytes: 0 %
Lymphocytes Relative: 8 %
Lymphs Abs: 0.6 10*3/uL — ABNORMAL LOW (ref 0.7–4.0)
MCH: 29.2 pg (ref 26.0–34.0)
MCHC: 33.2 g/dL (ref 30.0–36.0)
MCV: 88.2 fL (ref 80.0–100.0)
Monocytes Absolute: 0.3 10*3/uL (ref 0.1–1.0)
Monocytes Relative: 4 %
Neutro Abs: 5.7 10*3/uL (ref 1.7–7.7)
Neutrophils Relative %: 87 %
Platelet Count: 305 10*3/uL (ref 150–400)
RBC: 4.65 MIL/uL (ref 4.22–5.81)
RDW: 15.5 % (ref 11.5–15.5)
WBC Count: 6.7 10*3/uL (ref 4.0–10.5)
nRBC: 0 % (ref 0.0–0.2)

## 2020-05-23 LAB — CMP (CANCER CENTER ONLY)
ALT: 24 U/L (ref 0–44)
AST: 19 U/L (ref 15–41)
Albumin: 3.7 g/dL (ref 3.5–5.0)
Alkaline Phosphatase: 80 U/L (ref 38–126)
Anion gap: 8 (ref 5–15)
BUN: 11 mg/dL (ref 6–20)
CO2: 24 mmol/L (ref 22–32)
Calcium: 9.2 mg/dL (ref 8.9–10.3)
Chloride: 108 mmol/L (ref 98–111)
Creatinine: 0.92 mg/dL (ref 0.61–1.24)
GFR, Estimated: >60 mL/min (ref >60)
Glucose, Bld: 130 mg/dL — ABNORMAL HIGH (ref 70–99)
Potassium: 4.1 mmol/L (ref 3.5–5.1)
Sodium: 140 mmol/L (ref 135–145)
Total Bilirubin: 0.3 mg/dL (ref 0.3–1.2)
Total Protein: 6.4 g/dL — ABNORMAL LOW (ref 6.5–8.1)

## 2020-05-23 LAB — URIC ACID: Uric Acid, Serum: 4.5 mg/dL (ref 3.7–8.6)

## 2020-05-23 LAB — LACTATE DEHYDROGENASE: LDH: 185 U/L (ref 98–192)

## 2020-05-23 MED ORDER — DOXORUBICIN HCL CHEMO IV INJECTION 2 MG/ML
50.0000 mg/m2 | Freq: Once | INTRAVENOUS | Status: AC
  Administered 2020-05-23: 94 mg via INTRAVENOUS
  Filled 2020-05-23: qty 47

## 2020-05-23 MED ORDER — SODIUM CHLORIDE 0.9 % IV SOLN
Freq: Once | INTRAVENOUS | Status: AC
  Filled 2020-05-23: qty 250

## 2020-05-23 MED ORDER — SODIUM CHLORIDE 0.9 % IV SOLN
150.0000 mg | Freq: Once | INTRAVENOUS | Status: AC
  Administered 2020-05-23: 150 mg via INTRAVENOUS
  Filled 2020-05-23: qty 150

## 2020-05-23 MED ORDER — SODIUM CHLORIDE 0.9 % IV SOLN
750.0000 mg/m2 | Freq: Once | INTRAVENOUS | Status: AC
  Administered 2020-05-23: 1420 mg via INTRAVENOUS
  Filled 2020-05-23: qty 71

## 2020-05-23 MED ORDER — DIPHENHYDRAMINE HCL 25 MG PO CAPS
ORAL_CAPSULE | ORAL | Status: AC
  Filled 2020-05-23: qty 2

## 2020-05-23 MED ORDER — SODIUM CHLORIDE 0.9 % IV SOLN
10.0000 mg | Freq: Once | INTRAVENOUS | Status: AC
  Administered 2020-05-23: 10 mg via INTRAVENOUS
  Filled 2020-05-23: qty 10

## 2020-05-23 MED ORDER — SODIUM CHLORIDE 0.9% FLUSH
10.0000 mL | Freq: Once | INTRAVENOUS | Status: AC
Start: 2020-05-23 — End: 2020-05-23
  Administered 2020-05-23: 10 mL
  Filled 2020-05-23: qty 10

## 2020-05-23 MED ORDER — PALONOSETRON HCL INJECTION 0.25 MG/5ML
0.2500 mg | Freq: Once | INTRAVENOUS | Status: AC
  Administered 2020-05-23: 0.25 mg via INTRAVENOUS

## 2020-05-23 MED ORDER — SODIUM CHLORIDE 0.9 % IV SOLN
375.0000 mg/m2 | Freq: Once | INTRAVENOUS | Status: AC
  Administered 2020-05-23: 700 mg via INTRAVENOUS
  Filled 2020-05-23: qty 50

## 2020-05-23 MED ORDER — ACETAMINOPHEN 325 MG PO TABS
ORAL_TABLET | ORAL | Status: AC
  Filled 2020-05-23: qty 2

## 2020-05-23 MED ORDER — ACETAMINOPHEN 325 MG PO TABS
650.0000 mg | ORAL_TABLET | Freq: Once | ORAL | Status: AC
  Administered 2020-05-23: 650 mg via ORAL

## 2020-05-23 MED ORDER — PALONOSETRON HCL INJECTION 0.25 MG/5ML
INTRAVENOUS | Status: AC
  Filled 2020-05-23: qty 5

## 2020-05-23 MED ORDER — VINCRISTINE SULFATE CHEMO INJECTION 1 MG/ML
2.0000 mg | Freq: Once | INTRAVENOUS | Status: AC
  Administered 2020-05-23: 2 mg via INTRAVENOUS
  Filled 2020-05-23: qty 2

## 2020-05-23 MED ORDER — DIPHENHYDRAMINE HCL 25 MG PO CAPS
50.0000 mg | ORAL_CAPSULE | Freq: Once | ORAL | Status: AC
  Administered 2020-05-23: 50 mg via ORAL

## 2020-05-23 MED ORDER — HEPARIN SOD (PORK) LOCK FLUSH 100 UNIT/ML IV SOLN
500.0000 [IU] | Freq: Once | INTRAVENOUS | Status: AC | PRN
  Administered 2020-05-23: 500 [IU]
  Filled 2020-05-23: qty 5

## 2020-05-23 MED ORDER — SODIUM CHLORIDE 0.9% FLUSH
10.0000 mL | INTRAVENOUS | Status: DC | PRN
  Administered 2020-05-23: 10 mL
  Filled 2020-05-23: qty 10

## 2020-05-23 NOTE — Progress Notes (Signed)
[facility] [phone]   [phone]  PROGRESS NOTE  Patient Care Team: Dettinger, Elige Radon, MD as PCP - General (Family Medicine)  Hematological/Oncological History # Diffuse Large B Cell Lymphoma, Staging IV  04/07/2020: presented to the ED with MRI findings concerning for extensive osseous metastatic disease including large volume paraspinal and epidural tumor in the mid and lower thoracic spine  04/08/2020: establish care with Dr. Leonides Schanz while inpatient. Scheduled for laminectomy/biopsy of metastatic spine lesion.   04/08/2020: laminectomy performed, biopsy results show DLBCL.   04/26/2020: PET CT scan shows widespread skeletal involvement of the malignancy with numerous Deauville 5 score osseous lesions.  Hypermetabolic lymph nodes in the neck, chest, abdomen and pelvis. 05/04/2020: Cycle 1 Day 1 of R-CHOP.  05/23/2020: Cycle 2 Day 1 of R-CHOP.   Interval History:  Shaun Clarke 60 y.o. 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 The patient's last visit was on 05/02/2020 at which time we discussed the steps moving forward after we reach a diagnosis of diffuse large B-cell lymphoma. In the interim since the last visit he has completed Cycle 1 of chemotherapy.  On exam today Shaun Clarke is accompanied by his wife.  He reports he a rough time is for cycle chemotherapy.  He notes that he felt great on day 1 but day 2 he began developing bone pain (before the G-CSF shot).  He reports that he describes it as a "aching that migrated throughout his body.  It was in multiple places but not at the same time.  He notes that he does have a little bit of nausea particularly at night.  He is also trying to discern what pains come from the neurosurgery that he had performed.  Reports his appetite has been good and his weight has been increasing.  He has had no issues with fevers, chills, sweats, vomiting or  diarrhea.  Surgical site has healed well and he is not having any issues with pain.  A full 10 point ROS is listed below.  MEDICAL HISTORY:  Past Medical History:  Diagnosis Date  . GERD (gastroesophageal reflux disease)     SURGICAL HISTORY: Past Surgical History:  Procedure Laterality Date  . APPENDECTOMY    . DECOMPRESSIVE LUMBAR LAMINECTOMY LEVEL 2 N/A 04/08/2020   Procedure: THORACIC FIVE-THORACIC SEVEN OPEN LAMINECTOMY FOR RESECTION OF EPIDURAL TUMOR ;  Surgeon: Bethann Goo, DO;  Location: [facility] OR;  Service: Neurosurgery;  Laterality: N/A;  . IR IMAGING GUIDED PORT INSERTION  04/29/2020  . KNEE ARTHROSCOPY     right knee 3 times  . SHOULDER BIOPSY     right shoulder lipoma removal    SOCIAL HISTORY: Social History   Socioeconomic History  . Marital status: Married    Spouse name: Not on file  . Number of children: 2  . Years of education: Not on file  . Highest education level: Not on file  Occupational History  . Not on file  Tobacco Use  . Smoking status: Current Every Day Smoker    Packs/day: 1.00  . Smokeless tobacco: Never Used  Vaping Use  . Vaping Use: Never used  Substance and Sexual Activity  . Alcohol use: Yes    Alcohol/week: 3.0 standard drinks    Types: 1 Glasses of wine, 2 Cans of beer per week    Comment: per week  . Drug use: Never  . Sexual activity: Yes  Partners: Female  Other Topics Concern  . Not on file  Social History Narrative  . Not on file   Social Determinants of Health   Financial Resource Strain: Not on file  Food Insecurity: Not on file  Transportation Needs: Not on file  Physical Activity: Not on file  Stress: Not on file  Social Connections: Not on file  Intimate Partner Violence: Not on file    FAMILY HISTORY: Family History  Problem Relation Age of Onset  . Pancreatic cancer Mother   . Diabetes Father   . Anxiety disorder Sister   . Multiple sclerosis Sister   . COPD Maternal Grandfather     ALLERGIES:  is  allergic to naproxen and gadolinium derivatives.  MEDICATIONS:  Current Outpatient Medications  Medication Sig Dispense Refill  . acetaminophen (TYLENOL) 500 MG tablet Take 500-1,000 mg by mouth every 6 (six) hours as needed (for pain).    Marland Kitchen allopurinol (ZYLOPRIM) 300 MG tablet Take 1 tablet (300 mg total) by mouth daily. 90 tablet 1  . lidocaine-prilocaine (EMLA) cream Apply 1 application topically as needed. 30 g 0  . methocarbamol (ROBAXIN) 500 MG tablet Take 1 tablet (500 mg total) by mouth every 6 (six) hours as needed for muscle spasms. 30 tablet 0  . ondansetron (ZOFRAN) 8 MG tablet Take 1 tablet (8 mg total) by mouth every 8 (eight) hours as needed for nausea or vomiting. 30 tablet 0  . oxyCODONE-acetaminophen (PERCOCET/ROXICET) 5-325 MG tablet Take 1 tablet by mouth every 4 (four) hours as needed for moderate pain. 30 tablet 0  . pantoprazole (PROTONIX) 40 MG tablet Take 1 tablet (40 mg total) by mouth 2 (two) times daily before a meal. 180 tablet 1  . predniSONE (DELTASONE) 20 MG tablet Take 3 tablets (60 mg total) by mouth as directed. Take 3 tablets in the morning on Day 1 of chemotherapy and for four days after. 15 tablet 3  . prochlorperazine (COMPAZINE) 10 MG tablet Take 1 tablet (10 mg total) by mouth every 6 (six) hours as needed for nausea or vomiting. 30 tablet 0  . sucralfate (CARAFATE) 1 g tablet Take 1 g by mouth 2 (two) times daily. (Patient not taking: No sig reported)     No current facility-administered medications for this visit.    REVIEW OF SYSTEMS:   Constitutional: ( - ) fevers, ( - )  chills , ( - ) night sweats Eyes: ( - ) blurriness of vision, ( - ) double vision, ( - ) watery eyes Ears, nose, mouth, throat, and face: ( - ) mucositis, ( - ) sore throat Respiratory: ( - ) cough, ( - ) dyspnea, ( - ) wheezes Cardiovascular: ( - ) palpitation, ( - ) chest discomfort, ( - ) lower extremity swelling Gastrointestinal:  ( + ) nausea, ( - ) heartburn, ( - ) change  in bowel habits Skin: ( - ) abnormal skin rashes Lymphatics: ( - ) new lymphadenopathy, ( - ) easy bruising Neurological: ( - ) numbness, ( - ) tingling, ( - ) new weaknesses Behavioral/Psych: ( - ) mood change, ( - ) new changes  All other systems were reviewed with the patient and are negative.  PHYSICAL EXAMINATION: ECOG PERFORMANCE STATUS: 1 - Symptomatic but completely ambulatory  Vitals:   05/23/20 0825  BP: 114/61  Pulse: 65  Resp: 17  Temp: 97.8 F (36.6 C)  SpO2: 100%   Filed Weights   05/23/20 0825  Weight: 173 lb 9.6 oz (  78.7 kg)    GENERAL: well appearing middle aged Caucasian male alert, no distress and comfortable SKIN: skin color, texture, turgor are normal, no rashes or significant lesions EYES: conjunctiva are pink and non-injected, sclera clear LUNGS: clear to auscultation and percussion with normal breathing effort HEART: regular rate & rhythm and no murmurs and no lower extremity edema Musculoskeletal: no cyanosis of digits and no clubbing  PSYCH: alert & oriented x 3, fluent speech NEURO: no focal motor/sensory deficits  LABORATORY DATA:  I have reviewed the data as listed CBC Latest Ref Rng & Units 05/23/2020 05/16/2020 05/11/2020  WBC 4.0 - 10.5 K/uL 6.7 7.3 1.0(L)  Hemoglobin 13.0 - 17.0 g/dL 13.6 13.3 13.0  Hematocrit 39.0 - 52.0 % 41.0 40.3 39.3  Platelets 150 - 400 K/uL 305 174 183    CMP Latest Ref Rng & Units 05/16/2020 05/11/2020 05/02/2020  Glucose 70 - 99 mg/dL 112(H) 110(H) 90  BUN 6 - 20 mg/dL 12 16 18   Creatinine 0.61 - 1.24 mg/dL 0.92 0.79 0.94  Sodium 135 - 145 mmol/L 141 141 142  Potassium 3.5 - 5.1 mmol/L 4.3 3.9 4.3  Chloride 98 - 111 mmol/L 105 107 105  CO2 22 - 32 mmol/L 25 27 26   Calcium 8.9 - 10.3 mg/dL 9.0 8.6(L) 9.5  Total Protein 6.5 - 8.1 g/dL 6.5 6.0(L) 6.7  Total Bilirubin 0.3 - 1.2 mg/dL <0.2(L) 0.4 0.5  Alkaline Phos 38 - 126 U/L 109 100 79  AST 15 - 41 U/L 20 20 18   ALT 0 - 44 U/L 43 43 27    Lab Results  Component  Value Date   MPROTEIN Not Observed 04/07/2020   Lab Results  Component Value Date   KPAFRELGTCHN 11.4 04/07/2020   LAMBDASER 9.0 04/07/2020   KAPLAMBRATIO 1.27 04/07/2020     RADIOGRAPHIC STUDIES: NM PET Image Initial (PI) Skull Base To Thigh  Result Date: 04/26/2020 CLINICAL DATA:  Initial treatment strategy for B-cell lymphoma. Prior midthoracic epidural tumor resection and known osseous metastatic disease. EXAM: NUCLEAR MEDICINE PET SKULL VERTEX TO THIGH TECHNIQUE: 8.1 mCi F-18 FDG was injected intravenously. Full-ring PET imaging was performed from the skull vertex to thigh after the radiotracer. CT data was obtained and used for attenuation correction and anatomic localization. Fasting blood glucose: 94 mg/dl COMPARISON:  Multiple exams, including 04/01/2020 CT abdomen FINDINGS: Mediastinal blood pool activity: SUV max 1.8 Liver activity: SUV max 2.9 HEAD/NECK: Small but hypermetabolic bilateral level IIb lymph nodes are present along with left eccentric small hypermetabolic suboccipital lymph nodes. Index left level IIb lymph node measuring 0.6 cm in short axis on image 49 series 4 has maximum SUV of 4.7, Deauville 4. Hypermetabolic activity in both palatine tonsils noted, maximum SUV on the left 13.1 and on the right 12.0, both Deauville 5. A hypermetabolic but indistinctly marginated lesion in the left parotid gland measuring about 1.2 cm in diameter has a maximum SUV of 10.4, Deauville 5. A small hypermetabolic lymph node just below the left parotid gland has maximum SUV of 9.9, Deauville 5. Posterior nasopharyngeal activity with maximum SUV 9.0, Deauville 5. Incidental CT findings: none CHEST: Hypermetabolic right pericardial lymph node 0.8 cm in short axis on image 122 of series 4, maximum SUV 8.6, Deauville 5. Notable paraspinal tumor is observed extending from the T5-6 vertebral level to the T10 vertebral level. At the T9-10 level, this tumor rind measures 1.8 cm in short axis on image 114  of series 4 with maximum SUV 11.0, Deauville 5.  This previously measured 1.9 cm in thickness on 04/07/2020. Small mildly hypermetabolic axillary and left subpectoral lymph nodes are present. A left subpectoral node measuring 0.6 cm in short axis on image 74 series 4 has a maximum SUV of 4.3, Deauville 4. Low-grade postoperative metabolic activity along the midthoracic laminectomy site and adjacent midline postoperative region. Incidental CT findings: Atherosclerotic calcification of the aortic arch. Bandlike atelectasis or scarring in both lower lobes. ABDOMEN/PELVIS: Physiologic activity in bowel. Left common iliac node 1.7 cm in short axis on image 173 of series 4 (formerly 1.8 cm on 04/01/2020) with maximum SUV 11.0, Deauville 5. Small hypermetabolic bilateral external iliac and inguinal lymph nodes. Index left inguinal lymph node 1.0 cm in short axis on image 214 of series 4 (formerly the same) with maximum SUV 14.0, Deauville 5. Incidental CT findings: Sigmoid colon diverticulosis. SKELETON: Widespread hypermetabolic skeletal lesions with little in the way of corresponding CT abnormality, throughout the visualized skeleton to include the skull base, spine, proximal humerus bilaterally, both scapula, sternum, ribs, bony pelvis, and proximal femurs. Index lesion anteriorly in the L3 vertebral body with maximum SUV 11.4, Deauville 5. Index lesion posteriorly in the right iliac bone maximum SUV 10.6 comment over L5. Index lesion in the left proximal femoral shaft, maximum SUV 8.2, Deauville 5. Incidental CT findings: Bridging spurring of the left sacroiliac joint. IMPRESSION: 1. Widespread skeletal involvement of malignancy with numerous Deauville 5 scattered osseous lesions without substantial associated CT abnormality. 2. Hypermetabolic lymph nodes in the neck, chest, and abdomen/pelvis are primarily Deauville 5 with some Deauville 4. Many of these nodes are small or normal in size but hypermetabolic. Few of the  nodes are abnormally enlarged, for example the left common iliac node measuring 1.7 cm in short axis. 3. Hypermetabolic activity in the left parotid gland could represent a hypermetabolic parotid lymph node or a primary parotid neoplasm. 4. Hypermetabolic activity in the posterior nasopharynx and in the palatine tonsils (Deauville 5). 5. Paraspinal tumor in the lower thorax along the pleural margin. Prior midthoracic laminectomy recently. 6. Other imaging findings of potential clinical significance: Aortic Atherosclerosis (ICD10-I70.0). Mild atelectasis in both lower lobes. Sigmoid colon diverticulosis. Electronically Signed   By: Van Clines M.D.   On: 04/26/2020 15:19   ECHOCARDIOGRAM COMPLETE  Result Date: 05/03/2020    ECHOCARDIOGRAM REPORT   Patient Name:   Shaun Clarke Date of Exam: 05/03/2020 Medical Rec #:  [id number]          Height:       68.0 in Accession #:    [id number]         Weight:       167.9 lb Date of Birth:  10-19-1960          BSA:          1.897 m Patient Age:    60 years           BP:           110/69 mmHg Patient Gender: M                  HR:           54 bpm. Exam Location:  Outpatient Procedure: 2D Echo, Cardiac Doppler, Color Doppler and Strain Analysis Indications:    Chemo Z09  History:        Patient has no prior history of Echocardiogram examinations.                 Risk  Factors:Dyslipidemia.  Sonographer:    Bernadene Person RDCS Referring Phys: [id number] [place]  1. Left ventricular ejection fraction, by estimation, is 55 to 60%. The left ventricle has normal function. The left ventricle has no regional wall motion abnormalities. Left ventricular diastolic parameters were normal. The average left ventricular global longitudinal strain is -25.4 %. The global longitudinal strain is normal.  2. Right ventricular systolic function is normal. The right ventricular size is normal. Tricuspid regurgitation signal is inadequate for assessing PA pressure.  3.  The mitral valve is normal in structure. No evidence of mitral valve regurgitation. No evidence of mitral stenosis.  4. The aortic valve is tricuspid. Aortic valve regurgitation is not visualized. No aortic stenosis is present.  5. The inferior vena cava is normal in size with <50% respiratory variability, suggesting right atrial pressure of 8 mmHg. FINDINGS  Left Ventricle: Left ventricular ejection fraction, by estimation, is 55 to 60%. The left ventricle has normal function. The left ventricle has no regional wall motion abnormalities. The average left ventricular global longitudinal strain is -25.4 %. The global longitudinal strain is normal. The left ventricular internal cavity size was normal in size. There is no left ventricular hypertrophy. Left ventricular diastolic parameters were normal. Right Ventricle: The right ventricular size is normal. No increase in right ventricular wall thickness. Right ventricular systolic function is normal. Tricuspid regurgitation signal is inadequate for assessing PA pressure. Left Atrium: Left atrial size was normal in size. Right Atrium: Right atrial size was normal in size. Pericardium: Trivial pericardial effusion is present. Mitral Valve: The mitral valve is normal in structure. No evidence of mitral valve regurgitation. No evidence of mitral valve stenosis. Tricuspid Valve: The tricuspid valve is normal in structure. Tricuspid valve regurgitation is trivial. Aortic Valve: The aortic valve is tricuspid. Aortic valve regurgitation is not visualized. No aortic stenosis is present. Pulmonic Valve: The pulmonic valve was not well visualized. Pulmonic valve regurgitation is trivial. Aorta: The aortic root and ascending aorta are structurally normal, with no evidence of dilitation. Venous: The inferior vena cava is normal in size with less than 50% respiratory variability, suggesting right atrial pressure of 8 mmHg. IAS/Shunts: No atrial level shunt detected by color flow  Doppler.  LEFT VENTRICLE PLAX 2D LVIDd:         4.70 cm  Diastology LVIDs:         3.30 cm  LV e' medial:    9.99 cm/s LV PW:         0.90 cm  LV E/e' medial:  6.0 LV IVS:        0.80 cm  LV e' lateral:   14.40 cm/s LVOT diam:     2.10 cm  LV E/e' lateral: 4.1 LV SV:         84 LV SV Index:   44       2D Longitudinal Strain LVOT Area:     3.46 cm 2D Strain GLS Avg:     -25.4 %  RIGHT VENTRICLE RV S prime:     15.80 cm/s TAPSE (M-mode): 1.9 cm LEFT ATRIUM             Index       RIGHT ATRIUM           Index LA diam:        2.90 cm 1.53 cm/m  RA Area:     13.80 cm LA Vol (A2C):   35.7 ml 18.82 ml/m RA Volume:  29.00 ml  15.28 ml/m LA Vol (A4C):   29.4 ml 15.50 ml/m LA Biplane Vol: 32.8 ml 17.29 ml/m  AORTIC VALVE LVOT Vmax:   129.00 cm/s LVOT Vmean:  78.800 cm/s LVOT VTI:    0.243 m  AORTA Ao Root diam: 3.10 cm Ao Asc diam:  3.20 cm MITRAL VALVE MV Area (PHT): 2.87 cm    SHUNTS MV Decel Time: 264 msec    Systemic VTI:  0.24 m MV E velocity: 59.60 cm/s  Systemic Diam: 2.10 cm MV A velocity: 49.00 cm/s MV E/A ratio:  1.22 Oswaldo Milian MD Electronically signed by Oswaldo Milian MD Signature Date/Time: 05/03/2020/10:00:46 PM    Final    IR IMAGING GUIDED PORT INSERTION  Result Date: 04/29/2020 INDICATION: Diffuse large B-cell lymphoma EXAM: IMPLANTED PORT A CATH PLACEMENT WITH ULTRASOUND AND FLUOROSCOPIC GUIDANCE MEDICATIONS: None ANESTHESIA/SEDATION: Moderate (conscious) sedation was employed during this procedure. A total of Versed 2 mg and Fentanyl 100 mcg was administered intravenously. Moderate Sedation Time: 17 minutes. The patient's level of consciousness and vital signs were monitored continuously by radiology nursing throughout the procedure under my direct supervision. FLUOROSCOPY TIME:  0 minutes, 24 seconds (4 mGy) COMPLICATIONS: None immediate. PROCEDURE: The procedure, risks, benefits, and alternatives were explained to the patient. Questions regarding the procedure were encouraged  and answered. The patient understands and consents to the procedure. A timeout was performed prior to the initiation of the procedure. Patient positioned supine on the angiography table. Right neck and anterior upper chest prepped and draped in the usual sterile fashion. All elements of maximal sterile barrier were utilized including, cap, mask, sterile gown, sterile gloves, large sterile drape, hand scrubbing and 2% Chlorhexidine for skin cleaning. The right internal jugular vein was evaluated with ultrasound and shown to be patent. A permanent ultrasound image was obtained and placed in the patient's medical record. Local anesthesia was provided with 1% lidocaine with epinephrine. Using sterile gel and a sterile probe cover, the right internal jugular vein was entered with a 21 ga needle during real time ultrasound guidance. 0.018 inch guidewire placed and 21 ga needle exchanged for transitional dilator set. Utilizing fluoroscopy, 0.035 inch guidewire advanced through the needle without difficulty. Attention then turned to the right anterior upper chest. Following local lidocaine administration, a port pocket was created. The catheter was connected to the port and brought from the pocket to the venotomy site through a subcutaneous tunnel. The catheter was cut to size and inserted through the peel-away sheath. The catheter tip was positioned at the cavoatrial junction using fluoroscopic guidance. The port aspirated and flushed well. The port pocket was closed with deep and superficial absorbable suture. The port pocket incision and venotomy sites were also sealed with Dermabond. IMPRESSION: Successful placement of a right internal jugular approach power injectable Port-A-Cath. The catheter is ready for immediate use. Electronically Signed   By: Miachel Roux M.D.   On: 04/29/2020 10:29    ASSESSMENT & PLAN EBERT FORRESTER 60 y.o. male with medical history significant for DLBCL metastatic to the spine who  presents for a follow up visit.  After review the labs, the records, schedule the patient the findings most consistent with a diffuse large B-cell lymphoma with staging in process.  We completed the staging with a PET CT scan which showed his disease does appear as higher stage (III or IV). At this time we will plan for at least 6 cycles of R-CHOP followed by repeat scans.   Previously we discussed  the risks and benefits of R-CHOP chemotherapy.  We noted the nature of diffuse large B-cell lymphoma and the possibility of achieving a complete remission with this chemotherapy regimen.  I also noted the side effects which include but are not limited to nausea, vomiting, hair loss, diarrhea, sedation, insomnia, hyperglycemia, high blood pressure, and cardiotoxicity.  The patient voices understanding of the risk and benefits and was agreeable to proceeding with treatment at this time.  The R-CHOP regimen consists of rituximab 375 mg per metered squared IV on day 1, cyclophosphamide 750 mg per metered squared on day 1, doxorubicin 50 mg/m on day 1, vincristine 1.4 mg per metered squared with a max dose of 2 mg on day 1, and prednisone 100 mg orally days 1 through 5.  A cycle lasts for 21 days and number of cycles planned will depend on the stage as determined by the PET CT scan.  IPI Prognostic Index: 2 points Good Prognosis (80% progression free survival)  # Diffuse Large B Cell Lymphoma, Stage IV --Findings are most consistent with a diffuse large B-cell lymphoma metastatic to the spine.  Given his metastatic disease in the spine he is at least a stage III or IV. -- PET CT scan confirms Stage IV disease.  --Patient is recovering well from his neurosurgical procedure --FISH testing negative for double HIT, I would recommend we proceed with R-CHOP chemotherapy as noted above.  --Cycle 1 Day 1 started on 05/04/2020 --today is Cycle 2 Day 1 --Plan for patient return to clinic prior to Cycle 3 Day 1 in 3  weeks.   #Supportive Care --chemotherapy education completed --port in place --Echocardiogram performed, adequate heart function to proceed. --zofran 8mg  q8H PRN and compazine 10mg  PO q6H for nausea -- allopurinol 300mg  PO daily for TLS prophylaxis -- EMLA cream for port -- no pain medication required at this time.   No orders of the defined types were placed in this encounter.  All questions were answered. The patient knows to call the clinic with any problems, questions or concerns.  A total of more than 30 minutes were spent on this encounter and over half of that time was spent on counseling and coordination of care as outlined above.   Ledell Peoples, MD Department of Hematology/Oncology [place] at [facility] Phone: [phone] Pager: [phone] Email: [email or web address]  05/23/2020 9:12 AM

## 2020-05-23 NOTE — Patient Instructions (Signed)
[facility]  Discharge Instructions: Thank you for choosing [facility] to provide your oncology and hematology care.   If you have a lab appointment with the Cancer Center, please go directly to the Cancer Center and check in at the registration area.   Wear comfortable clothing and clothing appropriate for easy access to any Portacath or PICC line.   We strive to give you quality time with your provider. You may need to reschedule your appointment if you arrive late (15 or more minutes).  Arriving late affects you and other patients whose appointments are after yours.  Also, if you miss three or more appointments without notifying the office, you may be dismissed from the clinic at the provider's discretion.      For prescription refill requests, have your pharmacy contact our office and allow 72 hours for refills to be completed.    Today you received the following chemotherapy and/or immunotherapy agents Doxorubicin, Cyclophosphamide, Vincristine and Rituximab and patient taking Prednisone at home with breakfast     To help prevent nausea and vomiting after your treatment, we encourage you to take your nausea medication as directed on the medicine chart you received in Patient Education session   BELOW ARE SYMPTOMS THAT SHOULD BE REPORTED IMMEDIATELY: . *FEVER GREATER THAN 100.4 F (38 C) OR HIGHER . *CHILLS OR SWEATING . *NAUSEA AND VOMITING THAT IS NOT CONTROLLED WITH YOUR NAUSEA MEDICATION . *UNUSUAL SHORTNESS OF BREATH . *UNUSUAL BRUISING OR BLEEDING . *URINARY PROBLEMS (pain or burning when urinating, or frequent urination) . *BOWEL PROBLEMS (unusual diarrhea, constipation, pain near the anus) . TENDERNESS IN MOUTH AND THROAT WITH OR WITHOUT PRESENCE OF ULCERS (sore throat, sores in mouth, or a toothache) . UNUSUAL RASH, SWELLING OR PAIN  . UNUSUAL VAGINAL DISCHARGE OR ITCHING   Items with * indicate a potential emergency and should be  followed up as soon as possible or go to the Emergency Department if any problems should occur.  Please show the CHEMOTHERAPY ALERT CARD or IMMUNOTHERAPY ALERT CARD at check-in to the Emergency Department and triage nurse.  Should you have questions after your visit or need to cancel or reschedule your appointment, please contact [facility]  Dept: [phone]  and follow the prompts.  Office hours are 8:00 a.m. to 4:30 p.m. Monday - Friday. Please note that voicemails left after 4:00 p.m. may not be returned until the following business day.  We are closed weekends and major holidays. You have access to a nurse at all times for urgent questions. Please call the main number to the clinic Dept: [phone] and follow the prompts.   For any non-urgent questions, you may also contact your provider using MyChart. We now offer e-Visits for anyone 18 and older to request care online for non-urgent symptoms. For details visit [email or web address].   Also download the MyChart app! Go to the app store, search "MyChart", open the app, select [facility], and log in with your MyChart username and password.  Due to Covid, a mask is required upon entering the hospital/clinic. If you do not have a mask, one will be given to you upon arrival. For doctor visits, patients may have 1 support person aged 18 or older with them. For treatment visits, patients cannot have anyone with them due to current Covid guidelines and our immunocompromised population.   

## 2020-05-25 ENCOUNTER — Other Ambulatory Visit: Payer: Self-pay

## 2020-05-25 ENCOUNTER — Inpatient Hospital Stay: Payer: 59

## 2020-05-25 VITALS — BP 97/61 | HR 60 | Temp 98.2°F | Resp 16

## 2020-05-25 DIAGNOSIS — Z5111 Encounter for antineoplastic chemotherapy: Secondary | ICD-10-CM | POA: Diagnosis not present

## 2020-05-25 DIAGNOSIS — C8339 Diffuse large B-cell lymphoma, extranodal and solid organ sites: Secondary | ICD-10-CM

## 2020-05-25 MED ORDER — PEGFILGRASTIM-CBQV 6 MG/0.6ML ~~LOC~~ SOSY
PREFILLED_SYRINGE | SUBCUTANEOUS | Status: AC
  Filled 2020-05-25: qty 0.6

## 2020-05-25 MED ORDER — PEGFILGRASTIM-CBQV 6 MG/0.6ML ~~LOC~~ SOSY
6.0000 mg | PREFILLED_SYRINGE | Freq: Once | SUBCUTANEOUS | Status: AC
  Administered 2020-05-25: 6 mg via SUBCUTANEOUS

## 2020-05-25 NOTE — Patient Instructions (Signed)
Pegfilgrastim injection What is this medicine? PEGFILGRASTIM (PEG fil gra stim) is a long-acting granulocyte colony-stimulating factor that stimulates the growth of neutrophils, a type of white blood cell important in the body's fight against infection. It is used to reduce the incidence of fever and infection in patients with certain types of cancer who are receiving chemotherapy that affects the bone marrow, and to increase survival after being exposed to high doses of radiation. This medicine may be used for other purposes; ask your health care provider or pharmacist if you have questions. COMMON BRAND NAME(S): Fulphila, Neulasta, Nyvepria, UDENYCA, Ziextenzo What should I tell my health care provider before I take this medicine? They need to know if you have any of these conditions:  kidney disease  latex allergy  ongoing radiation therapy  sickle cell disease  skin reactions to acrylic adhesives (On-Body Injector only)  an unusual or allergic reaction to pegfilgrastim, filgrastim, other medicines, foods, dyes, or preservatives  pregnant or trying to get pregnant  breast-feeding How should I use this medicine? This medicine is for injection under the skin. If you get this medicine at home, you will be taught how to prepare and give the pre-filled syringe or how to use the On-body Injector. Refer to the patient Instructions for Use for detailed instructions. Use exactly as directed. Tell your healthcare provider immediately if you suspect that the On-body Injector may not have performed as intended or if you suspect the use of the On-body Injector resulted in a missed or partial dose. It is important that you put your used needles and syringes in a special sharps container. Do not put them in a trash can. If you do not have a sharps container, call your pharmacist or healthcare provider to get one. Talk to your pediatrician regarding the use of this medicine in children. While this drug  may be prescribed for selected conditions, precautions do apply. Overdosage: If you think you have taken too much of this medicine contact a poison control center or emergency room at once. NOTE: This medicine is only for you. Do not share this medicine with others. What if I miss a dose? It is important not to miss your dose. Call your doctor or health care professional if you miss your dose. If you miss a dose due to an On-body Injector failure or leakage, a new dose should be administered as soon as possible using a single prefilled syringe for manual use. What may interact with this medicine? Interactions have not been studied. This list may not describe all possible interactions. Give your health care provider a list of all the medicines, herbs, non-prescription drugs, or dietary supplements you use. Also tell them if you smoke, drink alcohol, or use illegal drugs. Some items may interact with your medicine. What should I watch for while using this medicine? Your condition will be monitored carefully while you are receiving this medicine. You may need blood work done while you are taking this medicine. Talk to your health care provider about your risk of cancer. You may be more at risk for certain types of cancer if you take this medicine. If you are going to need a MRI, CT scan, or other procedure, tell your doctor that you are using this medicine (On-Body Injector only). What side effects may I notice from receiving this medicine? Side effects that you should report to your doctor or health care professional as soon as possible:  allergic reactions (skin rash, itching or hives, swelling of   the face, lips, or tongue)  back pain  dizziness  fever  pain, redness, or irritation at site where injected  pinpoint red spots on the skin  red or dark-brown urine  shortness of breath or breathing problems  stomach or side pain, or pain at the shoulder  swelling  tiredness  trouble  passing urine or change in the amount of urine  unusual bruising or bleeding Side effects that usually do not require medical attention (report to your doctor or health care professional if they continue or are bothersome):  bone pain  muscle pain This list may not describe all possible side effects. Call your doctor for medical advice about side effects. You may report side effects to FDA at 1-800-FDA-1088. Where should I keep my medicine? Keep out of the reach of children. If you are using this medicine at home, you will be instructed on how to store it. Throw away any unused medicine after the expiration date on the label. NOTE: This sheet is a summary. It may not cover all possible information. If you have questions about this medicine, talk to your doctor, pharmacist, or health care provider.  2021 Elsevier/Gold Standard (2019-01-16 13:20:51)  

## 2020-06-01 ENCOUNTER — Inpatient Hospital Stay: Payer: 59

## 2020-06-08 ENCOUNTER — Other Ambulatory Visit: Payer: 59

## 2020-06-15 ENCOUNTER — Encounter: Payer: Self-pay | Admitting: Hematology and Oncology

## 2020-06-15 ENCOUNTER — Inpatient Hospital Stay: Payer: 59

## 2020-06-15 ENCOUNTER — Other Ambulatory Visit: Payer: Self-pay

## 2020-06-15 ENCOUNTER — Inpatient Hospital Stay: Payer: 59 | Attending: Physician Assistant

## 2020-06-15 ENCOUNTER — Inpatient Hospital Stay (HOSPITAL_BASED_OUTPATIENT_CLINIC_OR_DEPARTMENT_OTHER): Payer: 59 | Admitting: Hematology and Oncology

## 2020-06-15 VITALS — BP 101/74 | HR 83 | Temp 98.4°F | Resp 18 | Wt 176.8 lb

## 2020-06-15 VITALS — BP 103/62 | HR 65 | Temp 97.9°F | Resp 16

## 2020-06-15 DIAGNOSIS — Z95828 Presence of other vascular implants and grafts: Secondary | ICD-10-CM

## 2020-06-15 DIAGNOSIS — C83398 Diffuse large b-cell lymphoma of other extranodal and solid organ sites: Secondary | ICD-10-CM

## 2020-06-15 DIAGNOSIS — F17210 Nicotine dependence, cigarettes, uncomplicated: Secondary | ICD-10-CM | POA: Diagnosis not present

## 2020-06-15 DIAGNOSIS — Z79899 Other long term (current) drug therapy: Secondary | ICD-10-CM | POA: Diagnosis not present

## 2020-06-15 DIAGNOSIS — C8339 Diffuse large B-cell lymphoma, extranodal and solid organ sites: Secondary | ICD-10-CM

## 2020-06-15 DIAGNOSIS — C8338 Diffuse large B-cell lymphoma, lymph nodes of multiple sites: Secondary | ICD-10-CM | POA: Diagnosis present

## 2020-06-15 DIAGNOSIS — Z5112 Encounter for antineoplastic immunotherapy: Secondary | ICD-10-CM

## 2020-06-15 DIAGNOSIS — Z5189 Encounter for other specified aftercare: Secondary | ICD-10-CM | POA: Insufficient documentation

## 2020-06-15 DIAGNOSIS — Z5111 Encounter for antineoplastic chemotherapy: Secondary | ICD-10-CM | POA: Insufficient documentation

## 2020-06-15 LAB — CMP (CANCER CENTER ONLY)
ALT: 18 U/L (ref 0–44)
AST: 20 U/L (ref 15–41)
Albumin: 3.7 g/dL (ref 3.5–5.0)
Alkaline Phosphatase: 79 U/L (ref 38–126)
Anion gap: 13 (ref 5–15)
BUN: 14 mg/dL (ref 6–20)
CO2: 24 mmol/L (ref 22–32)
Calcium: 9.6 mg/dL (ref 8.9–10.3)
Chloride: 105 mmol/L (ref 98–111)
Creatinine: 1.11 mg/dL (ref 0.61–1.24)
GFR, Estimated: >60 mL/min (ref >60)
Glucose, Bld: 120 mg/dL — ABNORMAL HIGH (ref 70–99)
Potassium: 4.2 mmol/L (ref 3.5–5.1)
Sodium: 142 mmol/L (ref 135–145)
Total Bilirubin: 0.4 mg/dL (ref 0.3–1.2)
Total Protein: 6.8 g/dL (ref 6.5–8.1)

## 2020-06-15 LAB — CBC WITH DIFFERENTIAL (CANCER CENTER ONLY)
Abs Immature Granulocytes: 0.02 10*3/uL (ref 0.00–0.07)
Basophils Absolute: 0.1 10*3/uL (ref 0.0–0.1)
Basophils Relative: 3 %
Eosinophils Absolute: 0.1 10*3/uL (ref 0.0–0.5)
Eosinophils Relative: 2 %
HCT: 41.6 % (ref 39.0–52.0)
Hemoglobin: 13.8 g/dL (ref 13.0–17.0)
Immature Granulocytes: 0 %
Lymphocytes Relative: 13 %
Lymphs Abs: 0.6 10*3/uL — ABNORMAL LOW (ref 0.7–4.0)
MCH: 28.9 pg (ref 26.0–34.0)
MCHC: 33.2 g/dL (ref 30.0–36.0)
MCV: 87.2 fL (ref 80.0–100.0)
Monocytes Absolute: 0.8 10*3/uL (ref 0.1–1.0)
Monocytes Relative: 17 %
Neutro Abs: 3.1 10*3/uL (ref 1.7–7.7)
Neutrophils Relative %: 65 %
Platelet Count: 398 10*3/uL (ref 150–400)
RBC: 4.77 MIL/uL (ref 4.22–5.81)
RDW: 15.9 % — ABNORMAL HIGH (ref 11.5–15.5)
WBC Count: 4.8 10*3/uL (ref 4.0–10.5)
nRBC: 0 % (ref 0.0–0.2)

## 2020-06-15 LAB — LACTATE DEHYDROGENASE: LDH: 195 U/L — ABNORMAL HIGH (ref 98–192)

## 2020-06-15 LAB — URIC ACID: Uric Acid, Serum: 5.1 mg/dL (ref 3.7–8.6)

## 2020-06-15 MED ORDER — HEPARIN SOD (PORK) LOCK FLUSH 100 UNIT/ML IV SOLN
500.0000 [IU] | Freq: Once | INTRAVENOUS | Status: AC | PRN
  Administered 2020-06-15: 500 [IU]
  Filled 2020-06-15: qty 5

## 2020-06-15 MED ORDER — DEXAMETHASONE SODIUM PHOSPHATE 100 MG/10ML IJ SOLN
10.0000 mg | Freq: Once | INTRAMUSCULAR | Status: AC
  Administered 2020-06-15: 10 mg via INTRAVENOUS
  Filled 2020-06-15: qty 10

## 2020-06-15 MED ORDER — ACETAMINOPHEN 325 MG PO TABS
ORAL_TABLET | ORAL | Status: AC
  Filled 2020-06-15: qty 2

## 2020-06-15 MED ORDER — ACETAMINOPHEN 325 MG PO TABS
650.0000 mg | ORAL_TABLET | Freq: Once | ORAL | Status: AC
  Administered 2020-06-15: 650 mg via ORAL

## 2020-06-15 MED ORDER — VINCRISTINE SULFATE CHEMO INJECTION 1 MG/ML
2.0000 mg | Freq: Once | INTRAVENOUS | Status: AC
  Administered 2020-06-15: 2 mg via INTRAVENOUS
  Filled 2020-06-15: qty 2

## 2020-06-15 MED ORDER — DIPHENHYDRAMINE HCL 25 MG PO CAPS
50.0000 mg | ORAL_CAPSULE | Freq: Once | ORAL | Status: AC
  Administered 2020-06-15: 50 mg via ORAL

## 2020-06-15 MED ORDER — SODIUM CHLORIDE 0.9 % IV SOLN
150.0000 mg | Freq: Once | INTRAVENOUS | Status: AC
  Administered 2020-06-15: 150 mg via INTRAVENOUS
  Filled 2020-06-15: qty 150

## 2020-06-15 MED ORDER — SODIUM CHLORIDE 0.9 % IV SOLN
Freq: Once | INTRAVENOUS | Status: AC
  Filled 2020-06-15: qty 250

## 2020-06-15 MED ORDER — SODIUM CHLORIDE 0.9% FLUSH
10.0000 mL | INTRAVENOUS | Status: DC | PRN
  Administered 2020-06-15: 10 mL
  Filled 2020-06-15: qty 10

## 2020-06-15 MED ORDER — CYCLOPHOSPHAMIDE CHEMO INJECTION 1 GM
750.0000 mg/m2 | Freq: Once | INTRAMUSCULAR | Status: AC
  Administered 2020-06-15: 1420 mg via INTRAVENOUS
  Filled 2020-06-15: qty 71

## 2020-06-15 MED ORDER — SODIUM CHLORIDE 0.9 % IV SOLN
375.0000 mg/m2 | Freq: Once | INTRAVENOUS | Status: AC
  Administered 2020-06-15: 700 mg via INTRAVENOUS
  Filled 2020-06-15: qty 50

## 2020-06-15 MED ORDER — DIPHENHYDRAMINE HCL 25 MG PO CAPS
ORAL_CAPSULE | ORAL | Status: AC
  Filled 2020-06-15: qty 2

## 2020-06-15 MED ORDER — SODIUM CHLORIDE 0.9% FLUSH
10.0000 mL | Freq: Once | INTRAVENOUS | Status: AC
Start: 2020-06-15 — End: 2020-06-15
  Administered 2020-06-15: 10 mL
  Filled 2020-06-15: qty 10

## 2020-06-15 MED ORDER — PALONOSETRON HCL INJECTION 0.25 MG/5ML
0.2500 mg | Freq: Once | INTRAVENOUS | Status: AC
  Administered 2020-06-15: 0.25 mg via INTRAVENOUS

## 2020-06-15 MED ORDER — DOXORUBICIN HCL CHEMO IV INJECTION 2 MG/ML
50.0000 mg/m2 | Freq: Once | INTRAVENOUS | Status: AC
  Administered 2020-06-15: 94 mg via INTRAVENOUS
  Filled 2020-06-15: qty 47

## 2020-06-15 MED ORDER — PALONOSETRON HCL INJECTION 0.25 MG/5ML
INTRAVENOUS | Status: AC
  Filled 2020-06-15: qty 5

## 2020-06-15 NOTE — Patient Instructions (Signed)
[facility]    Discharge Instructions: Thank you for choosing [facility] to provide your oncology and hematology care.   If you have a lab appointment with the [facility], please go directly to the [facility] and check in at the registration area.   Wear comfortable clothing and clothing appropriate for easy access to any Portacath or PICC line.   We strive to give you quality time with your provider. You may need to reschedule your appointment if you arrive late (15 or more minutes).  Arriving late affects you and other patients whose appointments are after yours.  Also, if you miss three or more appointments without notifying the office, you may be dismissed from the clinic at the provider's discretion.      For prescription refill requests, have your pharmacy contact our office and allow 72 hours for refills to be completed.    Today you received the following chemotherapy and/or immunotherapy agents: rituximab, doxorubicin, vincristine, and cyclophosphamide.      To help prevent nausea and vomiting after your treatment, we encourage you to take your nausea medication as directed.  BELOW ARE SYMPTOMS THAT SHOULD BE REPORTED IMMEDIATELY: . *FEVER GREATER THAN 100.4 F (38 C) OR HIGHER . *CHILLS OR SWEATING . *NAUSEA AND VOMITING THAT IS NOT CONTROLLED WITH YOUR NAUSEA MEDICATION . *UNUSUAL SHORTNESS OF BREATH . *UNUSUAL BRUISING OR BLEEDING . *URINARY PROBLEMS (pain or burning when urinating, or frequent urination) . *BOWEL PROBLEMS (unusual diarrhea, constipation, pain near the anus) . TENDERNESS IN MOUTH AND THROAT WITH OR WITHOUT PRESENCE OF ULCERS (sore throat, sores in mouth, or a toothache) . UNUSUAL RASH, SWELLING OR PAIN  . UNUSUAL VAGINAL DISCHARGE OR ITCHING   Items with * indicate a potential emergency and should be followed up as soon as possible or go to the Emergency Department if any problems should occur.  Please  show the CHEMOTHERAPY ALERT CARD or IMMUNOTHERAPY ALERT CARD at check-in to the Emergency Department and triage nurse.  Should you have questions after your visit or need to cancel or reschedule your appointment, please contact [facility]  Dept: [phone]  and follow the prompts.  Office hours are 8:00 a.m. to 4:30 p.m. Monday - Friday. Please note that voicemails left after 4:00 p.m. may not be returned until the following business day.  We are closed weekends and major holidays. You have access to a nurse at all times for urgent questions. Please call the main number to the clinic Dept: [phone] and follow the prompts.   For any non-urgent questions, you may also contact your provider using MyChart. We now offer e-Visits for anyone 62 and older to request care online for non-urgent symptoms. For details visit [email or web address].   Also download the MyChart app! Go to the app store, search "MyChart", open the app, select [facility], and log in with your MyChart username and password.  Due to Covid, a mask is required upon entering the hospital/clinic. If you do not have a mask, one will be given to you upon arrival. For doctor visits, patients may have 1 support person aged 49 or older with them. For treatment visits, patients cannot have anyone with them due to current Covid guidelines and our immunocompromised population.

## 2020-06-15 NOTE — Progress Notes (Signed)
[facility] Telephone:([phone]) [phone]   [phone]  PROGRESS NOTE  Patient Care Team: Dettinger, Fransisca Kaufmann, MD as PCP - General (Family Medicine)  Hematological/Oncological History # Diffuse Large B Cell Lymphoma, Staging IV  04/07/2020: presented to the ED with MRI findings concerning for extensive osseous metastatic disease including large volume paraspinal and epidural tumor in the mid and lower thoracic spine  04/08/2020: establish care with Dr. Lorenso Courier while inpatient. Scheduled for laminectomy/biopsy of metastatic spine lesion.   04/08/2020: laminectomy performed, biopsy results show DLBCL.   04/26/2020: PET CT scan shows widespread skeletal involvement of the malignancy with numerous Deauville 5 score osseous lesions.  Hypermetabolic lymph nodes in the neck, chest, abdomen and pelvis. 05/04/2020: Cycle 1 Day 1 of R-CHOP.  05/23/2020: Cycle 2 Day 1 of R-CHOP 06/15/2020: Cycle 3 Day 1 of R-CHOP  Interval History:  Shaun Clarke 60 y.o. 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 The patient's last visit was on 05/23/2020 at which time we discussed the steps moving forward after we reach a diagnosis of diffuse large B-cell lymphoma. In the interim since the last visit he has completed Cycle 2 of chemotherapy.  On exam today Shaun Clarke is accompanied by his wife.  He reports his primary symptom has been fatigue.  He reports that he gets tired after only a few hours of exertion.  He reports that he feels "physically weak".  He can get unsteady on his feet but has not had any falls and he notes that that improved quickly as time and off of the chemotherapy.  He reports that his worst day is "day 9".  He thinks that is because the steroids have worn off and the maximum toxicity of the chemotherapy is starting to hit him at that time.  He has been taking Compazine and helps with the unusual feeling that he has in  his stomach.  He notes he is not having any insomnia with steroids and he currently denies having any infectious symptoms.  He has had some generalized aches and pains which she has found worrisome but none of them are particularly focal or strong.  He has had no issues with fevers, chills, sweats, vomiting or diarrhea.  Surgical site has healed well and he is not having any issues with pain.  A full 10 point ROS is listed below.  MEDICAL HISTORY:  Past Medical History:  Diagnosis Date  . GERD (gastroesophageal reflux disease)     SURGICAL HISTORY: Past Surgical History:  Procedure Laterality Date  . APPENDECTOMY    . DECOMPRESSIVE LUMBAR LAMINECTOMY LEVEL 2 N/A 04/08/2020   Procedure: THORACIC FIVE-THORACIC SEVEN OPEN LAMINECTOMY FOR RESECTION OF EPIDURAL TUMOR ;  Surgeon: Karsten Ro, DO;  Location: [facility];  Service: Neurosurgery;  Laterality: N/A;  . IR IMAGING GUIDED PORT INSERTION  04/29/2020  . KNEE ARTHROSCOPY     right knee 3 times  . SHOULDER BIOPSY     right shoulder lipoma removal    SOCIAL HISTORY: Social History   Socioeconomic History  . Marital status: Married    Spouse name: Not on file  . Number of children: 2  . Years of education: Not on file  . Highest education level: Not on file  Occupational History  . Not on file  Tobacco Use  . Smoking status: Current Every Day Smoker    Packs/day: 1.00  . Smokeless tobacco: Never Used  Vaping  Use  . Vaping Use: Never used  Substance and Sexual Activity  . Alcohol use: Yes    Alcohol/week: 3.0 standard drinks    Types: 1 Glasses of wine, 2 Cans of beer per week    Comment: per week  . Drug use: Never  . Sexual activity: Yes    Partners: Female  Other Topics Concern  . Not on file  Social History Narrative  . Not on file   Social Determinants of Health   Financial Resource Strain: Not on file  Food Insecurity: Not on file  Transportation Needs: Not on file  Physical Activity: Not on file  Stress: Not on  file  Social Connections: Not on file  Intimate Partner Violence: Not on file    FAMILY HISTORY: Family History  Problem Relation Age of Onset  . Pancreatic cancer Mother   . Diabetes Father   . Anxiety disorder Sister   . Multiple sclerosis Sister   . COPD Maternal Grandfather     ALLERGIES:  is allergic to naproxen and gadolinium derivatives.  MEDICATIONS:  Current Outpatient Medications  Medication Sig Dispense Refill  . acetaminophen (TYLENOL) 500 MG tablet Take 500-1,000 mg by mouth every 6 (six) hours as needed (for pain).    Marland Kitchen allopurinol (ZYLOPRIM) 300 MG tablet Take 1 tablet (300 mg total) by mouth daily. 90 tablet 1  . lidocaine-prilocaine (EMLA) cream Apply 1 application topically as needed. 30 g 0  . methocarbamol (ROBAXIN) 500 MG tablet Take 1 tablet (500 mg total) by mouth every 6 (six) hours as needed for muscle spasms. 30 tablet 0  . ondansetron (ZOFRAN) 8 MG tablet Take 1 tablet (8 mg total) by mouth every 8 (eight) hours as needed for nausea or vomiting. 30 tablet 0  . oxyCODONE-acetaminophen (PERCOCET/ROXICET) 5-325 MG tablet Take 1 tablet by mouth every 4 (four) hours as needed for moderate pain. 30 tablet 0  . pantoprazole (PROTONIX) 40 MG tablet Take 1 tablet (40 mg total) by mouth 2 (two) times daily before a meal. 180 tablet 1  . predniSONE (DELTASONE) 20 MG tablet Take 3 tablets (60 mg total) by mouth as directed. Take 3 tablets in the morning on Day 1 of chemotherapy and for four days after. 15 tablet 3  . prochlorperazine (COMPAZINE) 10 MG tablet Take 1 tablet (10 mg total) by mouth every 6 (six) hours as needed for nausea or vomiting. 30 tablet 0  . sucralfate (CARAFATE) 1 g tablet Take 1 g by mouth 2 (two) times daily. (Patient not taking: No sig reported)     No current facility-administered medications for this visit.    REVIEW OF SYSTEMS:   Constitutional: ( - ) fevers, ( - )  chills , ( - ) night sweats Eyes: ( - ) blurriness of vision, ( - )  double vision, ( - ) watery eyes Ears, nose, mouth, throat, and face: ( - ) mucositis, ( - ) sore throat Respiratory: ( - ) cough, ( - ) dyspnea, ( - ) wheezes Cardiovascular: ( - ) palpitation, ( - ) chest discomfort, ( - ) lower extremity swelling Gastrointestinal:  ( + ) nausea, ( - ) heartburn, ( - ) change in bowel habits Skin: ( - ) abnormal skin rashes Lymphatics: ( - ) new lymphadenopathy, ( - ) easy bruising Neurological: ( - ) numbness, ( - ) tingling, ( - ) new weaknesses Behavioral/Psych: ( - ) mood change, ( - ) new changes  All other systems were  reviewed with the patient and are negative.  PHYSICAL EXAMINATION: ECOG PERFORMANCE STATUS: 1 - Symptomatic but completely ambulatory  Vitals:   06/15/20 0820  BP: 101/74  Pulse: 83  Resp: 18  Temp: 98.4 F (36.9 C)  SpO2: 95%   Filed Weights   06/15/20 0820  Weight: 176 lb 12.8 oz (80.2 kg)    GENERAL: well appearing middle aged Caucasian male alert, no distress and comfortable SKIN: skin color, texture, turgor are normal, no rashes or significant lesions EYES: conjunctiva are pink and non-injected, sclera clear LUNGS: clear to auscultation and percussion with normal breathing effort HEART: regular rate & rhythm and no murmurs and no lower extremity edema Musculoskeletal: no cyanosis of digits and no clubbing  PSYCH: alert & oriented x 3, fluent speech NEURO: no focal motor/sensory deficits  LABORATORY DATA:  I have reviewed the data as listed CBC Latest Ref Rng & Units 06/15/2020 05/23/2020 05/16/2020  WBC 4.0 - 10.5 K/uL 4.8 6.7 7.3  Hemoglobin 13.0 - 17.0 g/dL 13.8 13.6 13.3  Hematocrit 39.0 - 52.0 % 41.6 41.0 40.3  Platelets 150 - 400 K/uL 398 305 174    CMP Latest Ref Rng & Units 06/15/2020 05/23/2020 05/16/2020  Glucose 70 - 99 mg/dL 120(H) 130(H) 112(H)  BUN 6 - 20 mg/dL 14 11 12   Creatinine 0.61 - 1.24 mg/dL 1.11 0.92 0.92  Sodium 135 - 145 mmol/L 142 140 141  Potassium 3.5 - 5.1 mmol/L 4.2 4.1 4.3  Chloride  98 - 111 mmol/L 105 108 105  CO2 22 - 32 mmol/L 24 24 25   Calcium 8.9 - 10.3 mg/dL 9.6 9.2 9.0  Total Protein 6.5 - 8.1 g/dL 6.8 6.4(L) 6.5  Total Bilirubin 0.3 - 1.2 mg/dL 0.4 0.3 <0.2(L)  Alkaline Phos 38 - 126 U/L 79 80 109  AST 15 - 41 U/L 20 19 20   ALT 0 - 44 U/L 18 24 43    Lab Results  Component Value Date   MPROTEIN Not Observed 04/07/2020   Lab Results  Component Value Date   KPAFRELGTCHN 11.4 04/07/2020   LAMBDASER 9.0 04/07/2020   KAPLAMBRATIO 1.27 04/07/2020     RADIOGRAPHIC STUDIES: No results found.  ASSESSMENT & PLAN Shaun Clarke 60 y.o. 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  After review the labs, the records, schedule the patient the findings most consistent with a diffuse large B-cell lymphoma with staging in process.  We completed the staging with a PET CT scan which showed his disease does appear as higher stage (III or IV). At this time we will plan for at least 6 cycles of R-CHOP followed by repeat scans.   Previously we discussed the risks and benefits of R-CHOP chemotherapy.  We noted the nature of diffuse large B-cell lymphoma and the possibility of achieving a complete remission with this chemotherapy regimen.  I also noted the side effects which include but are not limited to nausea, vomiting, hair loss, diarrhea, sedation, insomnia, hyperglycemia, high blood pressure, and cardiotoxicity.  The patient voices understanding of the risk and benefits and was agreeable to proceeding with treatment at this time.  The R-CHOP regimen consists of rituximab 375 mg per metered squared IV on day 1, cyclophosphamide 750 mg per metered squared on day 1, doxorubicin 50 mg/m on day 1, vincristine 1.4 mg per metered squared with a max dose of 2 mg on day 1, and prednisone 100 mg orally days 1  through 5.  A cycle lasts for 21 days and number of cycles planned will depend on the stage as determined  by the PET CT scan.  IPI Prognostic Index: 2 points Good Prognosis (80% progression free survival)  # Diffuse Large B Cell Lymphoma, Stage IV --Findings are most consistent with a diffuse large B-cell lymphoma metastatic to the spine.  Given his metastatic disease in the spine he is at least a stage III or IV. -- PET CT scan confirms Stage IV disease.  --Patient continues recovering well from his neurosurgical procedure --FISH testing negative for double HIT, I would recommend we proceed with R-CHOP chemotherapy as noted above.  --Cycle 1 Day 1 started on 05/04/2020 --today is Cycle 3 Day 1 --Plan for patient return to clinic prior to Cycle 4 Day 1 in 3 weeks with PET before that visit.   #Supportive Care --chemotherapy education completed --port in place --Echocardiogram performed, adequate heart function to proceed. --zofran 8mg  q8H PRN and compazine 10mg  PO q6H for nausea -- allopurinol 300mg  PO daily for TLS prophylaxis -- EMLA cream for port -- no pain medication required at this time.   No orders of the defined types were placed in this encounter.  All questions were answered. The patient knows to call the clinic with any problems, questions or concerns.  A total of more than 30 minutes were spent on this encounter and over half of that time was spent on counseling and coordination of care as outlined above.   Ledell Peoples, MD Department of Hematology/Oncology [place] at [facility] Phone: [phone] Pager: [phone] Email: [email or web address]  06/15/2020 5:43 PM

## 2020-06-17 ENCOUNTER — Other Ambulatory Visit: Payer: Self-pay

## 2020-06-17 ENCOUNTER — Inpatient Hospital Stay: Payer: 59

## 2020-06-17 VITALS — BP 103/58 | HR 61 | Temp 97.6°F | Resp 18

## 2020-06-17 DIAGNOSIS — Z5112 Encounter for antineoplastic immunotherapy: Secondary | ICD-10-CM | POA: Diagnosis not present

## 2020-06-17 DIAGNOSIS — C8339 Diffuse large B-cell lymphoma, extranodal and solid organ sites: Secondary | ICD-10-CM

## 2020-06-17 MED ORDER — PEGFILGRASTIM-CBQV 6 MG/0.6ML ~~LOC~~ SOSY
6.0000 mg | PREFILLED_SYRINGE | Freq: Once | SUBCUTANEOUS | Status: AC
  Administered 2020-06-17: 6 mg via SUBCUTANEOUS

## 2020-06-17 MED ORDER — PEGFILGRASTIM-CBQV 6 MG/0.6ML ~~LOC~~ SOSY
PREFILLED_SYRINGE | SUBCUTANEOUS | Status: AC
  Filled 2020-06-17: qty 0.6

## 2020-06-22 ENCOUNTER — Other Ambulatory Visit: Payer: 59

## 2020-06-29 ENCOUNTER — Other Ambulatory Visit: Payer: 59

## 2020-07-04 ENCOUNTER — Ambulatory Visit (HOSPITAL_COMMUNITY)
Admission: RE | Admit: 2020-07-04 | Discharge: 2020-07-04 | Disposition: A | Discharge status: Home / Self Care | Payer: 59 | Source: Ambulatory Visit | Attending: Hematology and Oncology | Admitting: Hematology and Oncology

## 2020-07-04 ENCOUNTER — Other Ambulatory Visit: Payer: Self-pay

## 2020-07-04 DIAGNOSIS — C8339 Diffuse large B-cell lymphoma, extranodal and solid organ sites: Secondary | ICD-10-CM | POA: Diagnosis not present

## 2020-07-04 LAB — GLUCOSE, CAPILLARY: Glucose-Capillary: 99 mg/dL (ref 70–99)

## 2020-07-04 MED ORDER — FLUDEOXYGLUCOSE F - 18 (FDG) INJECTION
9.1000 | Freq: Once | INTRAVENOUS | Status: AC | PRN
  Administered 2020-07-04: 9.1 via INTRAVENOUS

## 2020-07-06 ENCOUNTER — Inpatient Hospital Stay: Payer: 59

## 2020-07-06 ENCOUNTER — Other Ambulatory Visit: Payer: Self-pay

## 2020-07-06 ENCOUNTER — Inpatient Hospital Stay (HOSPITAL_BASED_OUTPATIENT_CLINIC_OR_DEPARTMENT_OTHER): Payer: 59 | Admitting: Hematology and Oncology

## 2020-07-06 VITALS — BP 99/69 | HR 76 | Temp 97.1°F | Resp 18 | Ht 68.0 in | Wt 179.4 lb

## 2020-07-06 DIAGNOSIS — Z5112 Encounter for antineoplastic immunotherapy: Secondary | ICD-10-CM

## 2020-07-06 DIAGNOSIS — C8339 Diffuse large B-cell lymphoma, extranodal and solid organ sites: Secondary | ICD-10-CM | POA: Diagnosis not present

## 2020-07-06 DIAGNOSIS — Z95828 Presence of other vascular implants and grafts: Secondary | ICD-10-CM | POA: Diagnosis not present

## 2020-07-06 DIAGNOSIS — C83398 Diffuse large b-cell lymphoma of other extranodal and solid organ sites: Secondary | ICD-10-CM

## 2020-07-06 DIAGNOSIS — Z5111 Encounter for antineoplastic chemotherapy: Secondary | ICD-10-CM

## 2020-07-06 LAB — CBC WITH DIFFERENTIAL (CANCER CENTER ONLY)
Abs Immature Granulocytes: 0.02 10*3/uL (ref 0.00–0.07)
Basophils Absolute: 0.1 10*3/uL (ref 0.0–0.1)
Basophils Relative: 3 %
Eosinophils Absolute: 0 10*3/uL (ref 0.0–0.5)
Eosinophils Relative: 1 %
HCT: 39.5 % (ref 39.0–52.0)
Hemoglobin: 13.2 g/dL (ref 13.0–17.0)
Immature Granulocytes: 1 %
Lymphocytes Relative: 17 %
Lymphs Abs: 0.7 10*3/uL (ref 0.7–4.0)
MCH: 28.7 pg (ref 26.0–34.0)
MCHC: 33.4 g/dL (ref 30.0–36.0)
MCV: 85.9 fL (ref 80.0–100.0)
Monocytes Absolute: 0.6 10*3/uL (ref 0.1–1.0)
Monocytes Relative: 15 %
Neutro Abs: 2.8 10*3/uL (ref 1.7–7.7)
Neutrophils Relative %: 63 %
Platelet Count: 376 10*3/uL (ref 150–400)
RBC: 4.6 MIL/uL (ref 4.22–5.81)
RDW: 15.9 % — ABNORMAL HIGH (ref 11.5–15.5)
WBC Count: 4.4 10*3/uL (ref 4.0–10.5)
nRBC: 0 % (ref 0.0–0.2)

## 2020-07-06 LAB — CMP (CANCER CENTER ONLY)
ALT: 16 U/L (ref 0–44)
AST: 16 U/L (ref 15–41)
Albumin: 3.5 g/dL (ref 3.5–5.0)
Alkaline Phosphatase: 81 U/L (ref 38–126)
Anion gap: 9 (ref 5–15)
BUN: 14 mg/dL (ref 6–20)
CO2: 25 mmol/L (ref 22–32)
Calcium: 9.2 mg/dL (ref 8.9–10.3)
Chloride: 107 mmol/L (ref 98–111)
Creatinine: 0.96 mg/dL (ref 0.61–1.24)
GFR, Estimated: >60 mL/min (ref >60)
Glucose, Bld: 108 mg/dL — ABNORMAL HIGH (ref 70–99)
Potassium: 4.2 mmol/L (ref 3.5–5.1)
Sodium: 141 mmol/L (ref 135–145)
Total Bilirubin: 0.4 mg/dL (ref 0.3–1.2)
Total Protein: 6.3 g/dL — ABNORMAL LOW (ref 6.5–8.1)

## 2020-07-06 LAB — LACTATE DEHYDROGENASE: LDH: 202 U/L — ABNORMAL HIGH (ref 98–192)

## 2020-07-06 LAB — URIC ACID: Uric Acid, Serum: 4.8 mg/dL (ref 3.7–8.6)

## 2020-07-06 MED ORDER — SODIUM CHLORIDE 0.9% FLUSH
10.0000 mL | Freq: Once | INTRAVENOUS | Status: AC
Start: 2020-07-06 — End: 2020-07-06
  Administered 2020-07-06: 10 mL
  Filled 2020-07-06: qty 10

## 2020-07-06 MED ORDER — DOXORUBICIN HCL CHEMO IV INJECTION 2 MG/ML
50.0000 mg/m2 | Freq: Once | INTRAVENOUS | Status: AC
  Administered 2020-07-06: 94 mg via INTRAVENOUS
  Filled 2020-07-06: qty 47

## 2020-07-06 MED ORDER — SODIUM CHLORIDE 0.9 % IV SOLN
750.0000 mg/m2 | Freq: Once | INTRAVENOUS | Status: AC
  Administered 2020-07-06: 1420 mg via INTRAVENOUS
  Filled 2020-07-06: qty 71

## 2020-07-06 MED ORDER — SODIUM CHLORIDE 0.9% FLUSH
10.0000 mL | INTRAVENOUS | Status: DC | PRN
  Administered 2020-07-06: 10 mL
  Filled 2020-07-06: qty 10

## 2020-07-06 MED ORDER — SODIUM CHLORIDE 0.9 % IV SOLN
150.0000 mg | Freq: Once | INTRAVENOUS | Status: AC
  Administered 2020-07-06: 150 mg via INTRAVENOUS
  Filled 2020-07-06: qty 150

## 2020-07-06 MED ORDER — DEXAMETHASONE SODIUM PHOSPHATE 100 MG/10ML IJ SOLN
10.0000 mg | Freq: Once | INTRAMUSCULAR | Status: AC
  Administered 2020-07-06: 10 mg via INTRAVENOUS
  Filled 2020-07-06: qty 10

## 2020-07-06 MED ORDER — SODIUM CHLORIDE 0.9 % IV SOLN
Freq: Once | INTRAVENOUS | Status: AC
  Filled 2020-07-06: qty 250

## 2020-07-06 MED ORDER — HEPARIN SOD (PORK) LOCK FLUSH 100 UNIT/ML IV SOLN
500.0000 [IU] | Freq: Once | INTRAVENOUS | Status: AC | PRN
  Administered 2020-07-06: 500 [IU]
  Filled 2020-07-06: qty 5

## 2020-07-06 MED ORDER — PREDNISONE 20 MG PO TABS: 60.0000 mg | ORAL_TABLET | ORAL | 3 refills | Status: DC

## 2020-07-06 MED ORDER — DIPHENHYDRAMINE HCL 25 MG PO CAPS
ORAL_CAPSULE | ORAL | Status: AC
  Filled 2020-07-06: qty 2

## 2020-07-06 MED ORDER — PALONOSETRON HCL INJECTION 0.25 MG/5ML
INTRAVENOUS | Status: AC
  Filled 2020-07-06: qty 5

## 2020-07-06 MED ORDER — VINCRISTINE SULFATE CHEMO INJECTION 1 MG/ML
2.0000 mg | Freq: Once | INTRAVENOUS | Status: AC
  Administered 2020-07-06: 2 mg via INTRAVENOUS
  Filled 2020-07-06: qty 2

## 2020-07-06 MED ORDER — SODIUM CHLORIDE 0.9 % IV SOLN
375.0000 mg/m2 | Freq: Once | INTRAVENOUS | Status: AC
  Administered 2020-07-06: 700 mg via INTRAVENOUS
  Filled 2020-07-06: qty 50

## 2020-07-06 MED ORDER — ACETAMINOPHEN 325 MG PO TABS
ORAL_TABLET | ORAL | Status: AC
  Filled 2020-07-06: qty 2

## 2020-07-06 MED ORDER — PALONOSETRON HCL INJECTION 0.25 MG/5ML
0.2500 mg | Freq: Once | INTRAVENOUS | Status: AC
  Administered 2020-07-06: 0.25 mg via INTRAVENOUS

## 2020-07-06 MED ORDER — DIPHENHYDRAMINE HCL 25 MG PO CAPS
50.0000 mg | ORAL_CAPSULE | Freq: Once | ORAL | Status: AC
  Administered 2020-07-06: 50 mg via ORAL

## 2020-07-06 MED ORDER — ACETAMINOPHEN 325 MG PO TABS
650.0000 mg | ORAL_TABLET | Freq: Once | ORAL | Status: AC
  Administered 2020-07-06: 650 mg via ORAL

## 2020-07-06 NOTE — Progress Notes (Signed)
[place] [phone]   [phone]  PROGRESS NOTE  Patient Care Team: Dettinger, Fransisca Kaufmann, MD as PCP - General (Family Medicine)  Hematological/Oncological History # Diffuse Large B Cell Lymphoma, Staging IV  04/07/2020: presented to the ED with MRI findings concerning for extensive osseous metastatic disease including large volume paraspinal and epidural tumor in the mid and lower thoracic spine  04/08/2020: establish care with Dr. Lorenso Courier while inpatient. Scheduled for laminectomy/biopsy of metastatic spine lesion.   04/08/2020: laminectomy performed, biopsy results show DLBCL.   04/26/2020: PET CT scan shows widespread skeletal involvement of the malignancy with numerous Deauville 5 score osseous lesions.  Hypermetabolic lymph nodes in the neck, chest, abdomen and pelvis. 05/04/2020: Cycle 1 Day 1 of R-CHOP.  05/23/2020: Cycle 2 Day 1 of R-CHOP 06/15/2020: Cycle 3 Day 1 of R-CHOP 07/04/2020: PET CT scan shows a complete metabolic response to therapy with only mild residual Deauville score 2 lymph nodes  07/06/2020: Cycle 4 Day 1 of R-CHOP  Interval History:  Shaun Clarke 60 y.o. 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 The patient's last visit was on 05/23/2020. In the interim since the last visit he has completed Cycle 3 of chemotherapy and had a PET CT scan performed which showed marked response to treatment.  On exam today Shaun Clarke is accompanied by his wife.  He reports the last round of chemotherapy was tough.  He reports that the rapid infusion of the rituximab caused him to have fatigue the day of his infusion.  He reports that he otherwise has had no issues other than fatigue.  He has had no issues with fevers, chills, sweats, vomiting or diarrhea.  Surgical site has healed well and he is not having any issues with pain.  A full 10 point ROS is listed below.  MEDICAL HISTORY:   Past Medical History:  Diagnosis Date   GERD (gastroesophageal reflux disease)     SURGICAL HISTORY: Past Surgical History:  Procedure Laterality Date   APPENDECTOMY     DECOMPRESSIVE LUMBAR LAMINECTOMY LEVEL 2 N/A 04/08/2020   Procedure: THORACIC FIVE-THORACIC SEVEN OPEN LAMINECTOMY FOR RESECTION OF EPIDURAL TUMOR ;  Surgeon: Karsten Ro, DO;  Location: [facility];  Service: Neurosurgery;  Laterality: N/A;   IR IMAGING GUIDED PORT INSERTION  04/29/2020   KNEE ARTHROSCOPY     right knee 3 times   SHOULDER BIOPSY     right shoulder lipoma removal    SOCIAL HISTORY: Social History   Socioeconomic History   Marital status: Married    Spouse name: Not on file   Number of children: 2   Years of education: Not on file   Highest education level: Not on file  Occupational History   Not on file  Tobacco Use   Smoking status: Every Day    Packs/day: 1.00    Pack years: 0.00    Types: Cigarettes   Smokeless tobacco: Never  Vaping Use   Vaping Use: Never used  Substance and Sexual Activity   Alcohol use: Yes    Alcohol/week: 3.0 standard drinks    Types: 1 Glasses of wine, 2 Cans of beer per week    Comment: per week   Drug use: Never   Sexual activity: Yes    Partners: Female  Other Topics Concern   Not on file  Social History Narrative   Not on file   Social Determinants of  Health   Financial Resource Strain: Not on file  Food Insecurity: Not on file  Transportation Needs: Not on file  Physical Activity: Not on file  Stress: Not on file  Social Connections: Not on file  Intimate Partner Violence: Not on file    FAMILY HISTORY: Family History  Problem Relation Age of Onset   Pancreatic cancer Mother    Diabetes Father    Anxiety disorder Sister    Multiple sclerosis Sister    COPD Maternal Grandfather     ALLERGIES:  is allergic to naproxen and gadolinium derivatives.  MEDICATIONS:  Current Outpatient Medications  Medication Sig Dispense Refill    acetaminophen (TYLENOL) 500 MG tablet Take 500-1,000 mg by mouth every 6 (six) hours as needed (for pain).     allopurinol (ZYLOPRIM) 300 MG tablet Take 1 tablet (300 mg total) by mouth daily. 90 tablet 1   lidocaine-prilocaine (EMLA) cream Apply 1 application topically as needed. 30 g 0   methocarbamol (ROBAXIN) 500 MG tablet Take 1 tablet (500 mg total) by mouth every 6 (six) hours as needed for muscle spasms. 30 tablet 0   ondansetron (ZOFRAN) 8 MG tablet Take 1 tablet (8 mg total) by mouth every 8 (eight) hours as needed for nausea or vomiting. 30 tablet 0   oxyCODONE-acetaminophen (PERCOCET/ROXICET) 5-325 MG tablet Take 1 tablet by mouth every 4 (four) hours as needed for moderate pain. 30 tablet 0   pantoprazole (PROTONIX) 40 MG tablet Take 1 tablet (40 mg total) by mouth 2 (two) times daily before a meal. 180 tablet 1   predniSONE (DELTASONE) 20 MG tablet Take 3 tablets (60 mg total) by mouth as directed. Take 3 tablets in the morning on Day 1 of chemotherapy and for four days after. 15 tablet 3   prochlorperazine (COMPAZINE) 10 MG tablet Take 1 tablet (10 mg total) by mouth every 6 (six) hours as needed for nausea or vomiting. 30 tablet 0   sucralfate (CARAFATE) 1 g tablet Take 1 g by mouth 2 (two) times daily. (Patient not taking: No sig reported)     No current facility-administered medications for this visit.   Facility-Administered Medications Ordered in Other Visits  Medication Dose Route Frequency Provider Last Rate Last Admin   sodium chloride flush (NS) 0.9 % injection 10 mL  10 mL Intracatheter PRN Orson Slick, MD   10 mL at 07/06/20 1457    REVIEW OF SYSTEMS:   Constitutional: ( - ) fevers, ( - )  chills , ( - ) night sweats Eyes: ( - ) blurriness of vision, ( - ) double vision, ( - ) watery eyes Ears, nose, mouth, throat, and face: ( - ) mucositis, ( - ) sore throat Respiratory: ( - ) cough, ( - ) dyspnea, ( - ) wheezes Cardiovascular: ( - ) palpitation, ( - ) chest  discomfort, ( - ) lower extremity swelling Gastrointestinal:  ( + ) nausea, ( - ) heartburn, ( - ) change in bowel habits Skin: ( - ) abnormal skin rashes Lymphatics: ( - ) new lymphadenopathy, ( - ) easy bruising Neurological: ( - ) numbness, ( - ) tingling, ( - ) new weaknesses Behavioral/Psych: ( - ) mood change, ( - ) new changes  All other systems were reviewed with the patient and are negative.  PHYSICAL EXAMINATION: ECOG PERFORMANCE STATUS: 1 - Symptomatic but completely ambulatory  Vitals:   07/06/20 0826  BP: 99/69  Pulse: 76  Resp: 18  Temp: (!)  97.1 F (36.2 C)  SpO2: 96%    Filed Weights   07/06/20 0826  Weight: 179 lb 6.4 oz (81.4 kg)    GENERAL: well appearing middle aged Caucasian male alert, no distress and comfortable SKIN: skin color, texture, turgor are normal, no rashes or significant lesions EYES: conjunctiva are pink and non-injected, sclera clear LUNGS: clear to auscultation and percussion with normal breathing effort HEART: regular rate & rhythm and no murmurs and no lower extremity edema Musculoskeletal: no cyanosis of digits and no clubbing  PSYCH: alert & oriented x 3, fluent speech NEURO: no focal motor/sensory deficits  LABORATORY DATA:  I have reviewed the data as listed CBC Latest Ref Rng & Units 07/06/2020 06/15/2020 05/23/2020  WBC 4.0 - 10.5 K/uL 4.4 4.8 6.7  Hemoglobin 13.0 - 17.0 g/dL 13.2 13.8 13.6  Hematocrit 39.0 - 52.0 % 39.5 41.6 41.0  Platelets 150 - 400 K/uL 376 398 305    CMP Latest Ref Rng & Units 07/06/2020 06/15/2020 05/23/2020  Glucose 70 - 99 mg/dL 108(H) 120(H) 130(H)  BUN 6 - 20 mg/dL 14 14 11   Creatinine 0.61 - 1.24 mg/dL 0.96 1.11 0.92  Sodium 135 - 145 mmol/L 141 142 140  Potassium 3.5 - 5.1 mmol/L 4.2 4.2 4.1  Chloride 98 - 111 mmol/L 107 105 108  CO2 22 - 32 mmol/L 25 24 24   Calcium 8.9 - 10.3 mg/dL 9.2 9.6 9.2  Total Protein 6.5 - 8.1 g/dL 6.3(L) 6.8 6.4(L)  Total Bilirubin 0.3 - 1.2 mg/dL 0.4 0.4 0.3  Alkaline  Phos 38 - 126 U/L 81 79 80  AST 15 - 41 U/L 16 20 19   ALT 0 - 44 U/L 16 18 24     Lab Results  Component Value Date   MPROTEIN Not Observed 04/07/2020   Lab Results  Component Value Date   KPAFRELGTCHN 11.4 04/07/2020   LAMBDASER 9.0 04/07/2020   KAPLAMBRATIO 1.27 04/07/2020     RADIOGRAPHIC STUDIES: NM PET Image Restag (PS) Skull Base To Thigh  Result Date: 07/04/2020 CLINICAL DATA:  Subsequent treatment strategy for hematologic malignancy, large B-cell lymphoma. EXAM: NUCLEAR MEDICINE PET SKULL BASE TO THIGH TECHNIQUE: 9 mCi F-18 FDG was injected intravenously. Full-ring PET imaging was performed from the skull base to thigh after the radiotracer. CT data was obtained and used for attenuation correction and anatomic localization. Fasting blood glucose: 99 mg/dl COMPARISON:  Prior imaging from April of 2022. FINDINGS: Mediastinal blood pool activity: SUV max 2.15 Liver activity: SUV max 2.96 NECK: Intense muscular uptake in the bilateral sternocleidomastoid and bilateral prevertebral musculature likely physiologic asymmetry of the bilateral neck musculature is noted without visible lesion. Decrease in bilateral tonsillar activity maximum SUV approximately 5.5 with relative symmetry as compared to 13 on the prior exam. Normal appearing symmetric parotid uptake. No signs of intraparotid uptake beyond expected uptake bilaterally. Resolution of subcutaneous lymph nodes in the occipital region. Incidental CT findings: none CHEST: Resolution of retropectoral activity. Resolution of LEFT axillary activity. Resolution of paraspinal activity, see below. Incidental CT findings: RIGHT-sided Port-A-Cath terminates at the caval to atrial junction. The aorta is of normal caliber with scattered atheromatous plaque. Normal caliber of central pulmonary vasculature. Normal heart size without substantial pericardial effusion. Basilar atelectasis generalized mild ground-glass throughout the chest. Mild generalized  FDG uptake throughout lung parenchyma the lung bases related to volume loss, nonspecific elsewhere but without focal abnormality. ABDOMEN/PELVIS: No abnormal hypermetabolic activity within the liver, pancreas, adrenal glands, or spleen. No hypermetabolic lymph nodes in  the abdomen or pelvis. No signs of abnormal uptake in the abdomen or pelvis at the site of previous multifocal disease. LEFT common iliac nodal tissue seen on the previous exam measuring approximately 8 mm (image 153/3) previously 16 mm with a maximum SUV of 2.0 as compared to 11.0. No new nodal disease. Incidental CT findings: Intense focal colonic uptake in the area of the sigmoid colon at the site of a diverticulum or adjacent to a diverticulum (image 169/4) maximum SUV of 7. Resolution of diffuse segmental uptake that was seen previously. Bridging of loops of sigmoid colon with some soft tissue on image 167/4. SKELETON: Near complete resolution of paraspinal soft tissue seen on previous imaging (image 81/4) 6 mm thickness soft tissue along the LEFT paraspinal region previously up to 13 mm with a maximum SUV in this area of 2.2 as compared to uptake values as much as 11. Paraspinal soft tissue elsewhere are markedly decreased to not visible on the current study. Discrete lesions throughout the spine and within ribs and bilateral upper and lower extremities are not seen on the current exam have resolved in the interval. Marrow activity throughout the spine with homogeneous appearance. Incidental CT findings: Signs of midthoracic laminectomy as before. IMPRESSION: Marked interval response to therapy. Remaining measurable area in terms of FDG and size in the LEFT paraspinal region and along the LEFT iliac chain best categorized at this time as Deauville category II. Some residual tonsillar uptake within the range of physiologic. Atelectasis with some scattered ground-glass/mosaic attenuation which could be due to mild air trapping. Correlate with any  respiratory symptoms. Focal uptake in the colon with signs of diverticular disease. This may represent a site of inflammation. Correlation with recent colonoscopy or with follow-up colonoscopy is suggested no recent colonoscopy was performed. Signs of potential colocolonic fistula or previous inflammation bridging sigmoid colonic loops as described in the setting of extensive diverticular changes, no gross signs of inflammation in this area. Attention on follow-up. Electronically Signed   By: Zetta Bills M.D.   On: 07/04/2020 11:27    ASSESSMENT & PLAN Shaun Clarke 60 y.o. 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  After review the labs, the records, schedule the patient the findings most consistent with a diffuse large B-cell lymphoma with staging in process.  We completed the staging with a PET CT scan which showed his disease does appear as higher stage (III or IV). At this time we will plan for at least 6 cycles of R-CHOP followed by repeat scans.    Previously we discussed the risks and benefits of R-CHOP chemotherapy.  We noted the nature of diffuse large B-cell lymphoma and the possibility of achieving a complete remission with this chemotherapy regimen.  I also noted the side effects which include but are not limited to nausea, vomiting, hair loss, diarrhea, sedation, insomnia, hyperglycemia, high blood pressure, and cardiotoxicity.  The patient voices understanding of the risk and benefits and was agreeable to proceeding with treatment at this time.   The R-CHOP regimen consists of rituximab 375 mg per metered squared IV on day 1, cyclophosphamide 750 mg per metered squared on day 1, doxorubicin 50 mg/m on day 1, vincristine 1.4 mg per metered squared with a max dose of 2 mg on day 1, and prednisone 100 mg orally days 1 through 5.  A cycle lasts for 21 days and number of cycles planned will depend on the  stage as determined  by the PET CT scan.  IPI Prognostic Index: 2 points Good Prognosis (80% progression free survival)  # Diffuse Large B Cell Lymphoma, Stage IV --Findings are most consistent with a diffuse large B-cell lymphoma metastatic to the spine.  Given his metastatic disease in the spine he is at least a stage III or IV. -- PET CT scan on 07/04/2020 showed complete metabolic response.  --Patient continues recovering well from his neurosurgical procedure --FISH testing negative for double HIT, I would recommend we proceed with R-CHOP chemotherapy as noted above.  --Cycle 1 Day 1 started on 05/04/2020 --today is Cycle 4 Day 1 --Plan for patient return to clinic prior to Cycle 5 Day 1 in 3 weeks   #Supportive Care --chemotherapy education completed --port in place --Echocardiogram performed, adequate heart function to proceed. --zofran 8mg  q8H PRN and compazine 10mg  PO q6H for nausea -- OK to d/c allopurinol  -- EMLA cream for port -- no pain medication required at this time.   No orders of the defined types were placed in this encounter.  All questions were answered. The patient knows to call the clinic with any problems, questions or concerns.  A total of more than 30 minutes were spent on this encounter and over half of that time was spent on counseling and coordination of care as outlined above.   Ledell Peoples, MD Department of Hematology/Oncology [facility] Phone: [phone] Pager: [phone] Email: [email or web address]  07/06/2020 3:28 PM

## 2020-07-06 NOTE — Patient Instructions (Signed)

## 2020-07-06 NOTE — Patient Instructions (Signed)
[facility]  Discharge Instructions: Thank you for choosing [facility] to provide your oncology and hematology care.   If you have a lab appointment with the [facility], please go directly to the [facility] and check in at the registration area.   Wear comfortable clothing and clothing appropriate for easy access to any Portacath or PICC line.   We strive to give you quality time with your provider. You may need to reschedule your appointment if you arrive late (15 or more minutes).  Arriving late affects you and other patients whose appointments are after yours.  Also, if you miss three or more appointments without notifying the office, you may be dismissed from the clinic at the provider's discretion.      For prescription refill requests, have your pharmacy contact our office and allow 72 hours for refills to be completed.    Today you received the following chemotherapy and/or immunotherapy agents :Rituxan, Adriamycin,Vincristine, Cytoxan   To help prevent nausea and vomiting after your treatment, we encourage you to take your nausea medication as directed.  BELOW ARE SYMPTOMS THAT SHOULD BE REPORTED IMMEDIATELY: *FEVER GREATER THAN 100.4 F (38 C) OR HIGHER *CHILLS OR SWEATING *NAUSEA AND VOMITING THAT IS NOT CONTROLLED WITH YOUR NAUSEA MEDICATION *UNUSUAL SHORTNESS OF BREATH *UNUSUAL BRUISING OR BLEEDING *URINARY PROBLEMS (pain or burning when urinating, or frequent urination) *BOWEL PROBLEMS (unusual diarrhea, constipation, pain near the anus) TENDERNESS IN MOUTH AND THROAT WITH OR WITHOUT PRESENCE OF ULCERS (sore throat, sores in mouth, or a toothache) UNUSUAL RASH, SWELLING OR PAIN  UNUSUAL VAGINAL DISCHARGE OR ITCHING   Items with * indicate a potential emergency and should be followed up as soon as possible or go to the Emergency Department if any problems should occur.  Please show the CHEMOTHERAPY ALERT CARD or  IMMUNOTHERAPY ALERT CARD at check-in to the Emergency Department and triage nurse.  Should you have questions after your visit or need to cancel or reschedule your appointment, please contact [facility]  Dept: [phone]  and follow the prompts.  Office hours are 8:00 a.m. to 4:30 p.m. Monday - Friday. Please note that voicemails left after 4:00 p.m. may not be returned until the following business day.  We are closed weekends and major holidays. You have access to a nurse at all times for urgent questions. Please call the main number to the clinic Dept: [phone] and follow the prompts.   For any non-urgent questions, you may also contact your provider using MyChart. We now offer e-Visits for anyone 67 and older to request care online for non-urgent symptoms. For details visit [email or web address].   Also download the MyChart app! Go to the app store, search "MyChart", open the app, select [facility], and log in with your MyChart username and password.  Due to Covid, a mask is required upon entering the hospital/clinic. If you do not have a mask, one will be given to you upon arrival. For doctor visits, patients may have 1 support person aged 64 or older with them. For treatment visits, patients cannot have anyone with them due to current Covid guidelines and our immunocompromised population.

## 2020-07-08 ENCOUNTER — Telehealth: Payer: Self-pay | Admitting: Hematology and Oncology

## 2020-07-08 ENCOUNTER — Other Ambulatory Visit: Payer: Self-pay

## 2020-07-08 ENCOUNTER — Encounter: Payer: Self-pay | Admitting: Hematology and Oncology

## 2020-07-08 ENCOUNTER — Inpatient Hospital Stay: Payer: 59 | Attending: Physician Assistant

## 2020-07-08 VITALS — BP 107/63 | HR 60 | Temp 97.9°F | Resp 18

## 2020-07-08 DIAGNOSIS — Z5111 Encounter for antineoplastic chemotherapy: Secondary | ICD-10-CM | POA: Diagnosis present

## 2020-07-08 DIAGNOSIS — Z7952 Long term (current) use of systemic steroids: Secondary | ICD-10-CM | POA: Insufficient documentation

## 2020-07-08 DIAGNOSIS — Z5112 Encounter for antineoplastic immunotherapy: Secondary | ICD-10-CM | POA: Insufficient documentation

## 2020-07-08 DIAGNOSIS — Z5189 Encounter for other specified aftercare: Secondary | ICD-10-CM | POA: Insufficient documentation

## 2020-07-08 DIAGNOSIS — Z79899 Other long term (current) drug therapy: Secondary | ICD-10-CM | POA: Diagnosis not present

## 2020-07-08 DIAGNOSIS — C8338 Diffuse large B-cell lymphoma, lymph nodes of multiple sites: Secondary | ICD-10-CM | POA: Diagnosis present

## 2020-07-08 DIAGNOSIS — Z9049 Acquired absence of other specified parts of digestive tract: Secondary | ICD-10-CM | POA: Insufficient documentation

## 2020-07-08 DIAGNOSIS — C83398 Diffuse large b-cell lymphoma of other extranodal and solid organ sites: Secondary | ICD-10-CM

## 2020-07-08 DIAGNOSIS — K219 Gastro-esophageal reflux disease without esophagitis: Secondary | ICD-10-CM | POA: Insufficient documentation

## 2020-07-08 DIAGNOSIS — C8339 Diffuse large B-cell lymphoma, extranodal and solid organ sites: Secondary | ICD-10-CM

## 2020-07-08 MED ORDER — PEGFILGRASTIM-CBQV 6 MG/0.6ML ~~LOC~~ SOSY
PREFILLED_SYRINGE | SUBCUTANEOUS | Status: AC
  Filled 2020-07-08: qty 0.6

## 2020-07-08 MED ORDER — PEGFILGRASTIM-CBQV 6 MG/0.6ML ~~LOC~~ SOSY
6.0000 mg | PREFILLED_SYRINGE | Freq: Once | SUBCUTANEOUS | Status: AC
  Administered 2020-07-08: 6 mg via SUBCUTANEOUS

## 2020-07-08 NOTE — Progress Notes (Signed)
Created and printed GFE(Good Faith Estimate) to provide to patient.

## 2020-07-08 NOTE — Patient Instructions (Signed)

## 2020-07-08 NOTE — Telephone Encounter (Signed)
Sch per 06/29 los , pt aware

## 2020-07-27 ENCOUNTER — Inpatient Hospital Stay: Payer: 59

## 2020-07-27 ENCOUNTER — Other Ambulatory Visit: Payer: Self-pay | Admitting: Hematology and Oncology

## 2020-07-27 ENCOUNTER — Other Ambulatory Visit: Payer: Self-pay

## 2020-07-27 ENCOUNTER — Inpatient Hospital Stay (HOSPITAL_BASED_OUTPATIENT_CLINIC_OR_DEPARTMENT_OTHER): Payer: 59 | Admitting: Hematology and Oncology

## 2020-07-27 VITALS — BP 106/65 | HR 59 | Temp 97.7°F | Resp 18 | Wt 183.5 lb

## 2020-07-27 DIAGNOSIS — Z5111 Encounter for antineoplastic chemotherapy: Secondary | ICD-10-CM

## 2020-07-27 DIAGNOSIS — C8339 Diffuse large B-cell lymphoma, extranodal and solid organ sites: Secondary | ICD-10-CM | POA: Diagnosis not present

## 2020-07-27 DIAGNOSIS — Z95828 Presence of other vascular implants and grafts: Secondary | ICD-10-CM | POA: Diagnosis not present

## 2020-07-27 DIAGNOSIS — Z5112 Encounter for antineoplastic immunotherapy: Secondary | ICD-10-CM

## 2020-07-27 LAB — CMP (CANCER CENTER ONLY)
ALT: 26 U/L (ref 0–44)
AST: 21 U/L (ref 15–41)
Albumin: 3.7 g/dL (ref 3.5–5.0)
Alkaline Phosphatase: 64 U/L (ref 38–126)
Anion gap: 7 (ref 5–15)
BUN: 16 mg/dL (ref 6–20)
CO2: 25 mmol/L (ref 22–32)
Calcium: 9.2 mg/dL (ref 8.9–10.3)
Chloride: 108 mmol/L (ref 98–111)
Creatinine: 0.85 mg/dL (ref 0.61–1.24)
GFR, Estimated: >60 mL/min (ref >60)
Glucose, Bld: 111 mg/dL — ABNORMAL HIGH (ref 70–99)
Potassium: 4.2 mmol/L (ref 3.5–5.1)
Sodium: 140 mmol/L (ref 135–145)
Total Bilirubin: 0.5 mg/dL (ref 0.3–1.2)
Total Protein: 6.2 g/dL — ABNORMAL LOW (ref 6.5–8.1)

## 2020-07-27 LAB — CBC WITH DIFFERENTIAL (CANCER CENTER ONLY)
Abs Immature Granulocytes: 0.01 10*3/uL (ref 0.00–0.07)
Basophils Absolute: 0.1 10*3/uL (ref 0.0–0.1)
Basophils Relative: 2 %
Eosinophils Absolute: 0 10*3/uL (ref 0.0–0.5)
Eosinophils Relative: 1 %
HCT: 38.3 % — ABNORMAL LOW (ref 39.0–52.0)
Hemoglobin: 12.5 g/dL — ABNORMAL LOW (ref 13.0–17.0)
Immature Granulocytes: 0 %
Lymphocytes Relative: 11 %
Lymphs Abs: 0.5 10*3/uL — ABNORMAL LOW (ref 0.7–4.0)
MCH: 29.1 pg (ref 26.0–34.0)
MCHC: 32.6 g/dL (ref 30.0–36.0)
MCV: 89.3 fL (ref 80.0–100.0)
Monocytes Absolute: 0.5 10*3/uL (ref 0.1–1.0)
Monocytes Relative: 11 %
Neutro Abs: 3.4 10*3/uL (ref 1.7–7.7)
Neutrophils Relative %: 75 %
Platelet Count: 340 10*3/uL (ref 150–400)
RBC: 4.29 MIL/uL (ref 4.22–5.81)
RDW: 17.5 % — ABNORMAL HIGH (ref 11.5–15.5)
WBC Count: 4.6 10*3/uL (ref 4.0–10.5)
nRBC: 0 % (ref 0.0–0.2)

## 2020-07-27 LAB — URIC ACID: Uric Acid, Serum: 4.1 mg/dL (ref 3.7–8.6)

## 2020-07-27 LAB — LACTATE DEHYDROGENASE: LDH: 150 U/L (ref 98–192)

## 2020-07-27 MED ORDER — SODIUM CHLORIDE 0.9% FLUSH
10.0000 mL | Freq: Once | INTRAVENOUS | Status: AC
  Administered 2020-07-27: 10 mL
  Filled 2020-07-27: qty 10

## 2020-07-27 MED ORDER — ACETAMINOPHEN 325 MG PO TABS
ORAL_TABLET | ORAL | Status: AC
  Filled 2020-07-27: qty 2

## 2020-07-27 MED ORDER — SODIUM CHLORIDE 0.9 % IV SOLN
10.0000 mg | Freq: Once | INTRAVENOUS | Status: AC
  Administered 2020-07-27: 10 mg via INTRAVENOUS
  Filled 2020-07-27: qty 10

## 2020-07-27 MED ORDER — ACETAMINOPHEN 325 MG PO TABS
650.0000 mg | ORAL_TABLET | Freq: Once | ORAL | Status: AC
  Administered 2020-07-27: 650 mg via ORAL

## 2020-07-27 MED ORDER — HEPARIN SOD (PORK) LOCK FLUSH 100 UNIT/ML IV SOLN
500.0000 [IU] | Freq: Once | INTRAVENOUS | Status: AC | PRN
  Administered 2020-07-27: 500 [IU]
  Filled 2020-07-27: qty 5

## 2020-07-27 MED ORDER — PALONOSETRON HCL INJECTION 0.25 MG/5ML
0.2500 mg | Freq: Once | INTRAVENOUS | Status: AC
  Administered 2020-07-27: 0.25 mg via INTRAVENOUS

## 2020-07-27 MED ORDER — SODIUM CHLORIDE 0.9 % IV SOLN
Freq: Once | INTRAVENOUS | Status: AC
  Filled 2020-07-27: qty 250

## 2020-07-27 MED ORDER — DIPHENHYDRAMINE HCL 25 MG PO CAPS
ORAL_CAPSULE | ORAL | Status: AC
  Filled 2020-07-27: qty 2

## 2020-07-27 MED ORDER — DIPHENHYDRAMINE HCL 25 MG PO CAPS
50.0000 mg | ORAL_CAPSULE | Freq: Once | ORAL | Status: AC
  Administered 2020-07-27: 50 mg via ORAL

## 2020-07-27 MED ORDER — SODIUM CHLORIDE 0.9 % IV SOLN
750.0000 mg/m2 | Freq: Once | INTRAVENOUS | Status: AC
  Administered 2020-07-27: 1420 mg via INTRAVENOUS
  Filled 2020-07-27: qty 71

## 2020-07-27 MED ORDER — DOXORUBICIN HCL CHEMO IV INJECTION 2 MG/ML
50.0000 mg/m2 | Freq: Once | INTRAVENOUS | Status: AC
  Administered 2020-07-27: 94 mg via INTRAVENOUS
  Filled 2020-07-27: qty 47

## 2020-07-27 MED ORDER — SODIUM CHLORIDE 0.9 % IV SOLN
150.0000 mg | Freq: Once | INTRAVENOUS | Status: AC
  Administered 2020-07-27: 150 mg via INTRAVENOUS
  Filled 2020-07-27: qty 150

## 2020-07-27 MED ORDER — VINCRISTINE SULFATE CHEMO INJECTION 1 MG/ML
2.0000 mg | Freq: Once | INTRAVENOUS | Status: AC
  Administered 2020-07-27: 2 mg via INTRAVENOUS
  Filled 2020-07-27: qty 2

## 2020-07-27 MED ORDER — SODIUM CHLORIDE 0.9% FLUSH
10.0000 mL | INTRAVENOUS | Status: DC | PRN
  Administered 2020-07-27: 10 mL
  Filled 2020-07-27: qty 10

## 2020-07-27 MED ORDER — PALONOSETRON HCL INJECTION 0.25 MG/5ML
INTRAVENOUS | Status: AC
  Filled 2020-07-27: qty 5

## 2020-07-27 MED ORDER — SODIUM CHLORIDE 0.9 % IV SOLN
375.0000 mg/m2 | Freq: Once | INTRAVENOUS | Status: AC
  Administered 2020-07-27: 700 mg via INTRAVENOUS
  Filled 2020-07-27: qty 50

## 2020-07-27 NOTE — Progress Notes (Signed)
[place] [phone]   [phone]  PROGRESS NOTE  Patient Care Team: Dettinger, Fransisca Kaufmann, MD as PCP - General (Family Medicine)  Hematological/Oncological History # Diffuse Large B Cell Lymphoma, Staging IV  04/07/2020: presented to the ED with MRI findings concerning for extensive osseous metastatic disease including large volume paraspinal and epidural tumor in the mid and lower thoracic spine  04/08/2020: establish care with Dr. Lorenso Courier while inpatient. Scheduled for laminectomy/biopsy of metastatic spine lesion.   04/08/2020: laminectomy performed, biopsy results show DLBCL.   04/26/2020: PET CT scan shows widespread skeletal involvement of the malignancy with numerous Deauville 5 score osseous lesions.  Hypermetabolic lymph nodes in the neck, chest, abdomen and pelvis. 05/04/2020: Cycle 1 Day 1 of R-CHOP.  05/23/2020: Cycle 2 Day 1 of R-CHOP 06/15/2020: Cycle 3 Day 1 of R-CHOP 07/04/2020: PET CT scan shows a complete metabolic response to therapy with only mild residual Deauville score 2 lymph nodes  07/06/2020: Cycle 4 Day 1 of R-CHOP 07/27/2020: Cycle 5 Day 1 of R-CHOP  Interval History:  Shaun Clarke 60 y.o. 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 The patient's last visit was on 07/06/2020. In the interim since the last visit he has completed Cycle 4.  On exam today Shaun Clarke reports that he tolerated the last cycle of chemotherapy well.  He notes that he has a big drop in his energy is and has trouble bouncing back until about day 10.  Fortunately he has not been having any major issues with nausea, vomiting, diarrhea, fevers, chills, sweats.  He notes he does have some swelling in his lower extremities and some occasional pain under his armpit.  He has been putting on weight is up to 179 pounds up from 176 pounds at his last cycle.  He reports that he otherwise has had no issues  other than fatigue. A full 10 point ROS is listed below.  MEDICAL HISTORY:  Past Medical History:  Diagnosis Date   GERD (gastroesophageal reflux disease)     SURGICAL HISTORY: Past Surgical History:  Procedure Laterality Date   APPENDECTOMY     DECOMPRESSIVE LUMBAR LAMINECTOMY LEVEL 2 N/A 04/08/2020   Procedure: THORACIC FIVE-THORACIC SEVEN OPEN LAMINECTOMY FOR RESECTION OF EPIDURAL TUMOR ;  Surgeon: Karsten Ro, DO;  Location: [facility];  Service: Neurosurgery;  Laterality: N/A;   IR IMAGING GUIDED PORT INSERTION  04/29/2020   KNEE ARTHROSCOPY     right knee 3 times   SHOULDER BIOPSY     right shoulder lipoma removal    SOCIAL HISTORY: Social History   Socioeconomic History   Marital status: Married    Spouse name: Not on file   Number of children: 2   Years of education: Not on file   Highest education level: Not on file  Occupational History   Not on file  Tobacco Use   Smoking status: Every Day    Packs/day: 1.00    Types: Cigarettes   Smokeless tobacco: Never  Vaping Use   Vaping Use: Never used  Substance and Sexual Activity   Alcohol use: Yes    Alcohol/week: 3.0 standard drinks    Types: 1 Glasses of wine, 2 Cans of beer per week    Comment: per week   Drug use: Never   Sexual activity: Yes    Partners: Female  Other Topics Concern   Not on file  Social History Narrative  Not on file   Social Determinants of Health   Financial Resource Strain: Not on file  Food Insecurity: Not on file  Transportation Needs: Not on file  Physical Activity: Not on file  Stress: Not on file  Social Connections: Not on file  Intimate Partner Violence: Not on file    FAMILY HISTORY: Family History  Problem Relation Age of Onset   Pancreatic cancer Mother    Diabetes Father    Anxiety disorder Sister    Multiple sclerosis Sister    COPD Maternal Grandfather     ALLERGIES:  is allergic to naproxen and gadolinium derivatives.  MEDICATIONS:  Current Outpatient  Medications  Medication Sig Dispense Refill   acetaminophen (TYLENOL) 500 MG tablet Take 500-1,000 mg by mouth every 6 (six) hours as needed (for pain).     allopurinol (ZYLOPRIM) 300 MG tablet Take 1 tablet (300 mg total) by mouth daily. 90 tablet 1   lidocaine-prilocaine (EMLA) cream Apply 1 application topically as needed. 30 g 0   methocarbamol (ROBAXIN) 500 MG tablet Take 1 tablet (500 mg total) by mouth every 6 (six) hours as needed for muscle spasms. 30 tablet 0   ondansetron (ZOFRAN) 8 MG tablet Take 1 tablet (8 mg total) by mouth every 8 (eight) hours as needed for nausea or vomiting. 30 tablet 0   oxyCODONE-acetaminophen (PERCOCET/ROXICET) 5-325 MG tablet Take 1 tablet by mouth every 4 (four) hours as needed for moderate pain. 30 tablet 0   pantoprazole (PROTONIX) 40 MG tablet Take 1 tablet (40 mg total) by mouth 2 (two) times daily before a meal. 180 tablet 1   predniSONE (DELTASONE) 20 MG tablet Take 3 tablets (60 mg total) by mouth as directed. Take 3 tablets in the morning on Day 1 of chemotherapy and for four days after. 15 tablet 3   prochlorperazine (COMPAZINE) 10 MG tablet Take 1 tablet (10 mg total) by mouth every 6 (six) hours as needed for nausea or vomiting. 30 tablet 0   sucralfate (CARAFATE) 1 g tablet Take 1 g by mouth 2 (two) times daily. (Patient not taking: No sig reported)     No current facility-administered medications for this visit.   Facility-Administered Medications Ordered in Other Visits  Medication Dose Route Frequency Provider Last Rate Last Admin   cyclophosphamide (CYTOXAN) 1,420 mg in sodium chloride 0.9 % 250 mL chemo infusion  750 mg/m2 (Treatment Plan Recorded) Intravenous Once Orson Slick, MD       dexamethasone (DECADRON) 10 mg in sodium chloride 0.9 % 50 mL IVPB  10 mg Intravenous Once Orson Slick, MD       DOXOrubicin (ADRIAMYCIN) chemo injection 94 mg  50 mg/m2 (Treatment Plan Recorded) Intravenous Once Orson Slick, MD        fosaprepitant (EMEND) 150 mg in sodium chloride 0.9 % 145 mL IVPB  150 mg Intravenous Once Ledell Peoples IV, MD       heparin lock flush 100 unit/mL  500 Units Intracatheter Once PRN Orson Slick, MD       riTUXimab-abbs (TRUXIMA) 700 mg in sodium chloride 0.9 % 250 mL (2.1875 mg/mL) infusion  375 mg/m2 (Treatment Plan Recorded) Intravenous Once Ledell Peoples IV, MD       sodium chloride flush (NS) 0.9 % injection 10 mL  10 mL Intracatheter PRN Ledell Peoples IV, MD       vinCRIStine (ONCOVIN) 2 mg in sodium chloride 0.9 % 50 mL  chemo infusion  2 mg Intravenous Once Orson Slick, MD        REVIEW OF SYSTEMS:   Constitutional: ( - ) fevers, ( - )  chills , ( - ) night sweats Eyes: ( - ) blurriness of vision, ( - ) double vision, ( - ) watery eyes Ears, nose, mouth, throat, and face: ( - ) mucositis, ( - ) sore throat Respiratory: ( - ) cough, ( - ) dyspnea, ( - ) wheezes Cardiovascular: ( - ) palpitation, ( - ) chest discomfort, ( - ) lower extremity swelling Gastrointestinal:  ( + ) nausea, ( - ) heartburn, ( - ) change in bowel habits Skin: ( - ) abnormal skin rashes Lymphatics: ( - ) new lymphadenopathy, ( - ) easy bruising Neurological: ( - ) numbness, ( - ) tingling, ( - ) new weaknesses Behavioral/Psych: ( - ) mood change, ( - ) new changes  All other systems were reviewed with the patient and are negative.  PHYSICAL EXAMINATION: ECOG PERFORMANCE STATUS: 1 - Symptomatic but completely ambulatory  There were no vitals filed for this visit.   There were no vitals filed for this visit.   GENERAL: well appearing middle aged Caucasian male alert, no distress and comfortable SKIN: skin color, texture, turgor are normal, no rashes or significant lesions EYES: conjunctiva are pink and non-injected, sclera clear LUNGS: clear to auscultation and percussion with normal breathing effort HEART: regular rate & rhythm and no murmurs and no lower extremity edema Musculoskeletal: no  cyanosis of digits and no clubbing  PSYCH: alert & oriented x 3, fluent speech NEURO: no focal motor/sensory deficits  LABORATORY DATA:  I have reviewed the data as listed CBC Latest Ref Rng & Units 07/27/2020 07/06/2020 06/15/2020  WBC 4.0 - 10.5 K/uL 4.6 4.4 4.8  Hemoglobin 13.0 - 17.0 g/dL 12.5(L) 13.2 13.8  Hematocrit 39.0 - 52.0 % 38.3(L) 39.5 41.6  Platelets 150 - 400 K/uL 340 376 398    CMP Latest Ref Rng & Units 07/27/2020 07/06/2020 06/15/2020  Glucose 70 - 99 mg/dL 111(H) 108(H) 120(H)  BUN 6 - 20 mg/dL 16 14 14   Creatinine 0.61 - 1.24 mg/dL 0.85 0.96 1.11  Sodium 135 - 145 mmol/L 140 141 142  Potassium 3.5 - 5.1 mmol/L 4.2 4.2 4.2  Chloride 98 - 111 mmol/L 108 107 105  CO2 22 - 32 mmol/L 25 25 24   Calcium 8.9 - 10.3 mg/dL 9.2 9.2 9.6  Total Protein 6.5 - 8.1 g/dL 6.2(L) 6.3(L) 6.8  Total Bilirubin 0.3 - 1.2 mg/dL 0.5 0.4 0.4  Alkaline Phos 38 - 126 U/L 64 81 79  AST 15 - 41 U/L 21 16 20   ALT 0 - 44 U/L 26 16 18     Lab Results  Component Value Date   MPROTEIN Not Observed 04/07/2020   Lab Results  Component Value Date   KPAFRELGTCHN 11.4 04/07/2020   LAMBDASER 9.0 04/07/2020   KAPLAMBRATIO 1.27 04/07/2020     RADIOGRAPHIC STUDIES: NM PET Image Restag (PS) Skull Base To Thigh  Result Date: 07/04/2020 CLINICAL DATA:  Subsequent treatment strategy for hematologic malignancy, large B-cell lymphoma. EXAM: NUCLEAR MEDICINE PET SKULL BASE TO THIGH TECHNIQUE: 9 mCi F-18 FDG was injected intravenously. Full-ring PET imaging was performed from the skull base to thigh after the radiotracer. CT data was obtained and used for attenuation correction and anatomic localization. Fasting blood glucose: 99 mg/dl COMPARISON:  Prior imaging from April of 2022. FINDINGS: Mediastinal blood pool  activity: SUV max 2.15 Liver activity: SUV max 2.96 NECK: Intense muscular uptake in the bilateral sternocleidomastoid and bilateral prevertebral musculature likely physiologic asymmetry of the  bilateral neck musculature is noted without visible lesion. Decrease in bilateral tonsillar activity maximum SUV approximately 5.5 with relative symmetry as compared to 13 on the prior exam. Normal appearing symmetric parotid uptake. No signs of intraparotid uptake beyond expected uptake bilaterally. Resolution of subcutaneous lymph nodes in the occipital region. Incidental CT findings: none CHEST: Resolution of retropectoral activity. Resolution of LEFT axillary activity. Resolution of paraspinal activity, see below. Incidental CT findings: RIGHT-sided Port-A-Cath terminates at the caval to atrial junction. The aorta is of normal caliber with scattered atheromatous plaque. Normal caliber of central pulmonary vasculature. Normal heart size without substantial pericardial effusion. Basilar atelectasis generalized mild ground-glass throughout the chest. Mild generalized FDG uptake throughout lung parenchyma the lung bases related to volume loss, nonspecific elsewhere but without focal abnormality. ABDOMEN/PELVIS: No abnormal hypermetabolic activity within the liver, pancreas, adrenal glands, or spleen. No hypermetabolic lymph nodes in the abdomen or pelvis. No signs of abnormal uptake in the abdomen or pelvis at the site of previous multifocal disease. LEFT common iliac nodal tissue seen on the previous exam measuring approximately 8 mm (image 153/3) previously 16 mm with a maximum SUV of 2.0 as compared to 11.0. No new nodal disease. Incidental CT findings: Intense focal colonic uptake in the area of the sigmoid colon at the site of a diverticulum or adjacent to a diverticulum (image 169/4) maximum SUV of 7. Resolution of diffuse segmental uptake that was seen previously. Bridging of loops of sigmoid colon with some soft tissue on image 167/4. SKELETON: Near complete resolution of paraspinal soft tissue seen on previous imaging (image 81/4) 6 mm thickness soft tissue along the LEFT paraspinal region previously up to  13 mm with a maximum SUV in this area of 2.2 as compared to uptake values as much as 11. Paraspinal soft tissue elsewhere are markedly decreased to not visible on the current study. Discrete lesions throughout the spine and within ribs and bilateral upper and lower extremities are not seen on the current exam have resolved in the interval. Marrow activity throughout the spine with homogeneous appearance. Incidental CT findings: Signs of midthoracic laminectomy as before. IMPRESSION: Marked interval response to therapy. Remaining measurable area in terms of FDG and size in the LEFT paraspinal region and along the LEFT iliac chain best categorized at this time as Deauville category II. Some residual tonsillar uptake within the range of physiologic. Atelectasis with some scattered ground-glass/mosaic attenuation which could be due to mild air trapping. Correlate with any respiratory symptoms. Focal uptake in the colon with signs of diverticular disease. This may represent a site of inflammation. Correlation with recent colonoscopy or with follow-up colonoscopy is suggested no recent colonoscopy was performed. Signs of potential colocolonic fistula or previous inflammation bridging sigmoid colonic loops as described in the setting of extensive diverticular changes, no gross signs of inflammation in this area. Attention on follow-up. Electronically Signed   By: Zetta Bills M.D.   On: 07/04/2020 11:27    ASSESSMENT & PLAN Shaun Clarke 60 y.o. 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  After review the labs, the records, schedule the patient the findings most consistent with a diffuse large B-cell lymphoma with staging in process.  We completed the staging with a PET CT scan which showed his disease does  appear as higher stage (III or IV). At this time we will plan for at least 6 cycles of R-CHOP followed by repeat scans.    Previously we  discussed the risks and benefits of R-CHOP chemotherapy.  We noted the nature of diffuse large B-cell lymphoma and the possibility of achieving a complete remission with this chemotherapy regimen.  I also noted the side effects which include but are not limited to nausea, vomiting, hair loss, diarrhea, sedation, insomnia, hyperglycemia, high blood pressure, and cardiotoxicity.  The patient voices understanding of the risk and benefits and was agreeable to proceeding with treatment at this time.   The R-CHOP regimen consists of rituximab 375 mg per metered squared IV on day 1, cyclophosphamide 750 mg per metered squared on day 1, doxorubicin 50 mg/m on day 1, vincristine 1.4 mg per metered squared with a max dose of 2 mg on day 1, and prednisone 100 mg orally days 1 through 5.  A cycle lasts for 21 days and number of cycles planned will depend on the stage as determined by the PET CT scan.  IPI Prognostic Index: 2 points Good Prognosis (80% progression free survival)  # Diffuse Large B Cell Lymphoma, Stage IV --Findings are most consistent with a diffuse large B-cell lymphoma metastatic to the spine.  Given his metastatic disease in the spine he is at least a stage III or IV. -- PET CT scan on 07/04/2020 showed complete metabolic response.  --Patient continues recovering well from his neurosurgical procedure --FISH testing negative for double HIT, I would recommend we proceed with R-CHOP chemotherapy as noted above.  --Cycle 1 Day 1 started on 05/04/2020 --today is Cycle 5 Day 1 --Plan for patient return to clinic prior to Cycle 6 Day 1 in 3 weeks   #Supportive Care --chemotherapy education completed --port in place --Echocardiogram performed, adequate heart function to proceed. --zofran 8mg  q8H PRN and compazine 10mg  PO q6H for nausea -- OK to d/c allopurinol  -- EMLA cream for port -- no pain medication required at this time.   No orders of the defined types were placed in this  encounter.  All questions were answered. The patient knows to call the clinic with any problems, questions or concerns.  A total of more than 30 minutes were spent on this encounter and over half of that time was spent on counseling and coordination of care as outlined above.   Ledell Peoples, MD Department of Hematology/Oncology [facility] Phone: [phone] Pager: [phone] Email: [email or web address]  07/27/2020 1:25 PM

## 2020-07-27 NOTE — Patient Instructions (Signed)
[facility] ONCOLOGY  Discharge Instructions: Thank you for choosing [facility] to provide your oncology and hematology care.   If you have a lab appointment with the [facility], please go directly to the [facility] and check in at the registration area.   Wear comfortable clothing and clothing appropriate for easy access to any Portacath or PICC line.   We strive to give you quality time with your provider. You may need to reschedule your appointment if you arrive late (15 or more minutes).  Arriving late affects you and other patients whose appointments are after yours.  Also, if you miss three or more appointments without notifying the office, you may be dismissed from the clinic at the provider's discretion.      For prescription refill requests, have your pharmacy contact our office and allow 72 hours for refills to be completed.    Today you received the following chemotherapy and/or immunotherapy agents: Adriamycin, Oncovin, Cytoxan, Truxima   To help prevent nausea and vomiting after your treatment, we encourage you to take your nausea medication as directed.  BELOW ARE SYMPTOMS THAT SHOULD BE REPORTED IMMEDIATELY: *FEVER GREATER THAN 100.4 F (38 C) OR HIGHER *CHILLS OR SWEATING *NAUSEA AND VOMITING THAT IS NOT CONTROLLED WITH YOUR NAUSEA MEDICATION *UNUSUAL SHORTNESS OF BREATH *UNUSUAL BRUISING OR BLEEDING *URINARY PROBLEMS (pain or burning when urinating, or frequent urination) *BOWEL PROBLEMS (unusual diarrhea, constipation, pain near the anus) TENDERNESS IN MOUTH AND THROAT WITH OR WITHOUT PRESENCE OF ULCERS (sore throat, sores in mouth, or a toothache) UNUSUAL RASH, SWELLING OR PAIN  UNUSUAL VAGINAL DISCHARGE OR ITCHING   Items with * indicate a potential emergency and should be followed up as soon as possible or go to the Emergency Department if any problems should occur.  Please show the CHEMOTHERAPY ALERT CARD or IMMUNOTHERAPY  ALERT CARD at check-in to the Emergency Department and triage nurse.  Should you have questions after your visit or need to cancel or reschedule your appointment, please contact [facility]  Dept: [phone]  and follow the prompts.  Office hours are 8:00 a.m. to 4:30 p.m. Monday - Friday. Please note that voicemails left after 4:00 p.m. may not be returned until the following business day.  We are closed weekends and major holidays. You have access to a nurse at all times for urgent questions. Please call the main number to the clinic Dept: [phone] and follow the prompts.   For any non-urgent questions, you may also contact your provider using MyChart. We now offer e-Visits for anyone 80 and older to request care online for non-urgent symptoms. For details visit [email or web address].   Also download the MyChart app! Go to the app store, search "MyChart", open the app, select [facility], and log in with your MyChart username and password.  Due to Covid, a mask is required upon entering the hospital/clinic. If you do not have a mask, one will be given to you upon arrival. For doctor visits, patients may have 1 support person aged 40 or older with them. For treatment visits, patients cannot have anyone with them due to current Covid guidelines and our immunocompromised population.

## 2020-07-29 ENCOUNTER — Inpatient Hospital Stay: Payer: 59

## 2020-07-29 ENCOUNTER — Other Ambulatory Visit: Payer: Self-pay

## 2020-07-29 VITALS — BP 125/64 | HR 65 | Temp 98.0°F | Resp 18

## 2020-07-29 DIAGNOSIS — Z5112 Encounter for antineoplastic immunotherapy: Secondary | ICD-10-CM | POA: Diagnosis not present

## 2020-07-29 DIAGNOSIS — C8339 Diffuse large B-cell lymphoma, extranodal and solid organ sites: Secondary | ICD-10-CM

## 2020-07-29 MED ORDER — PEGFILGRASTIM-CBQV 6 MG/0.6ML ~~LOC~~ SOSY
PREFILLED_SYRINGE | SUBCUTANEOUS | Status: AC
  Filled 2020-07-29: qty 0.6

## 2020-07-29 MED ORDER — PEGFILGRASTIM-CBQV 6 MG/0.6ML ~~LOC~~ SOSY
6.0000 mg | PREFILLED_SYRINGE | Freq: Once | SUBCUTANEOUS | Status: AC
  Administered 2020-07-29: 6 mg via SUBCUTANEOUS

## 2020-07-31 ENCOUNTER — Encounter: Payer: Self-pay | Admitting: Hematology and Oncology

## 2020-08-17 ENCOUNTER — Inpatient Hospital Stay: Payer: 59

## 2020-08-17 ENCOUNTER — Other Ambulatory Visit: Payer: Self-pay | Admitting: *Deleted

## 2020-08-17 ENCOUNTER — Inpatient Hospital Stay (HOSPITAL_BASED_OUTPATIENT_CLINIC_OR_DEPARTMENT_OTHER): Payer: 59 | Admitting: Hematology and Oncology

## 2020-08-17 ENCOUNTER — Other Ambulatory Visit: Payer: Self-pay

## 2020-08-17 ENCOUNTER — Inpatient Hospital Stay: Payer: 59 | Attending: Physician Assistant

## 2020-08-17 VITALS — BP 95/52 | HR 62 | Temp 97.7°F | Resp 16

## 2020-08-17 VITALS — BP 106/69 | HR 70 | Temp 97.6°F | Resp 20 | Wt 185.9 lb

## 2020-08-17 DIAGNOSIS — C8339 Diffuse large B-cell lymphoma, extranodal and solid organ sites: Secondary | ICD-10-CM

## 2020-08-17 DIAGNOSIS — C8338 Diffuse large B-cell lymphoma, lymph nodes of multiple sites: Secondary | ICD-10-CM | POA: Diagnosis present

## 2020-08-17 DIAGNOSIS — Z95828 Presence of other vascular implants and grafts: Secondary | ICD-10-CM

## 2020-08-17 DIAGNOSIS — Z5111 Encounter for antineoplastic chemotherapy: Secondary | ICD-10-CM

## 2020-08-17 DIAGNOSIS — Z5189 Encounter for other specified aftercare: Secondary | ICD-10-CM | POA: Insufficient documentation

## 2020-08-17 DIAGNOSIS — C83398 Diffuse large b-cell lymphoma of other extranodal and solid organ sites: Secondary | ICD-10-CM

## 2020-08-17 DIAGNOSIS — Z5112 Encounter for antineoplastic immunotherapy: Secondary | ICD-10-CM

## 2020-08-17 LAB — CMP (CANCER CENTER ONLY)
ALT: 18 U/L (ref 0–44)
AST: 16 U/L (ref 15–41)
Albumin: 3.6 g/dL (ref 3.5–5.0)
Alkaline Phosphatase: 70 U/L (ref 38–126)
Anion gap: 11 (ref 5–15)
BUN: 12 mg/dL (ref 6–20)
CO2: 23 mmol/L (ref 22–32)
Calcium: 9.5 mg/dL (ref 8.9–10.3)
Chloride: 108 mmol/L (ref 98–111)
Creatinine: 0.92 mg/dL (ref 0.61–1.24)
GFR, Estimated: >60 mL/min (ref >60)
Glucose, Bld: 120 mg/dL — ABNORMAL HIGH (ref 70–99)
Potassium: 3.9 mmol/L (ref 3.5–5.1)
Sodium: 142 mmol/L (ref 135–145)
Total Bilirubin: 0.4 mg/dL (ref 0.3–1.2)
Total Protein: 6.2 g/dL — ABNORMAL LOW (ref 6.5–8.1)

## 2020-08-17 LAB — CBC WITH DIFFERENTIAL (CANCER CENTER ONLY)
Abs Immature Granulocytes: 0.02 10*3/uL (ref 0.00–0.07)
Basophils Absolute: 0.1 10*3/uL (ref 0.0–0.1)
Basophils Relative: 2 %
Eosinophils Absolute: 0 10*3/uL (ref 0.0–0.5)
Eosinophils Relative: 0 %
HCT: 38.8 % — ABNORMAL LOW (ref 39.0–52.0)
Hemoglobin: 12.8 g/dL — ABNORMAL LOW (ref 13.0–17.0)
Immature Granulocytes: 0 %
Lymphocytes Relative: 10 %
Lymphs Abs: 0.5 10*3/uL — ABNORMAL LOW (ref 0.7–4.0)
MCH: 29.6 pg (ref 26.0–34.0)
MCHC: 33 g/dL (ref 30.0–36.0)
MCV: 89.6 fL (ref 80.0–100.0)
Monocytes Absolute: 0.6 10*3/uL (ref 0.1–1.0)
Monocytes Relative: 13 %
Neutro Abs: 3.8 10*3/uL (ref 1.7–7.7)
Neutrophils Relative %: 75 %
Platelet Count: 336 10*3/uL (ref 150–400)
RBC: 4.33 MIL/uL (ref 4.22–5.81)
RDW: 17.4 % — ABNORMAL HIGH (ref 11.5–15.5)
WBC Count: 5 10*3/uL (ref 4.0–10.5)
nRBC: 0 % (ref 0.0–0.2)

## 2020-08-17 LAB — URIC ACID: Uric Acid, Serum: 4.9 mg/dL (ref 3.7–8.6)

## 2020-08-17 LAB — LACTATE DEHYDROGENASE: LDH: 204 U/L — ABNORMAL HIGH (ref 98–192)

## 2020-08-17 MED ORDER — PROCHLORPERAZINE MALEATE 10 MG PO TABS: 10.0000 mg | ORAL_TABLET | Freq: Four times a day (QID) | ORAL | 0 refills | Status: DC | PRN

## 2020-08-17 MED ORDER — SODIUM CHLORIDE 0.9 % IV SOLN
150.0000 mg | Freq: Once | INTRAVENOUS | Status: AC
  Administered 2020-08-17: 150 mg via INTRAVENOUS
  Filled 2020-08-17: qty 150

## 2020-08-17 MED ORDER — SODIUM CHLORIDE 0.9 % IV SOLN
Freq: Once | INTRAVENOUS | Status: AC
  Filled 2020-08-17: qty 250

## 2020-08-17 MED ORDER — SODIUM CHLORIDE 0.9 % IV SOLN
375.0000 mg/m2 | Freq: Once | INTRAVENOUS | Status: AC
  Administered 2020-08-17: 700 mg via INTRAVENOUS
  Filled 2020-08-17: qty 50

## 2020-08-17 MED ORDER — DOXORUBICIN HCL CHEMO IV INJECTION 2 MG/ML
50.0000 mg/m2 | Freq: Once | INTRAVENOUS | Status: AC
  Administered 2020-08-17: 94 mg via INTRAVENOUS
  Filled 2020-08-17: qty 47

## 2020-08-17 MED ORDER — PALONOSETRON HCL INJECTION 0.25 MG/5ML
INTRAVENOUS | Status: AC
  Filled 2020-08-17: qty 5

## 2020-08-17 MED ORDER — SODIUM CHLORIDE 0.9% FLUSH
10.0000 mL | Freq: Once | INTRAVENOUS | Status: AC
  Administered 2020-08-17: 10 mL
  Filled 2020-08-17: qty 10

## 2020-08-17 MED ORDER — PALONOSETRON HCL INJECTION 0.25 MG/5ML
0.2500 mg | Freq: Once | INTRAVENOUS | Status: AC
  Administered 2020-08-17: 0.25 mg via INTRAVENOUS

## 2020-08-17 MED ORDER — ACETAMINOPHEN 325 MG PO TABS
650.0000 mg | ORAL_TABLET | Freq: Once | ORAL | Status: AC
  Administered 2020-08-17: 650 mg via ORAL

## 2020-08-17 MED ORDER — HEPARIN SOD (PORK) LOCK FLUSH 100 UNIT/ML IV SOLN
500.0000 [IU] | Freq: Once | INTRAVENOUS | Status: DC | PRN
  Filled 2020-08-17: qty 5

## 2020-08-17 MED ORDER — ACETAMINOPHEN 325 MG PO TABS
ORAL_TABLET | ORAL | Status: AC
  Filled 2020-08-17: qty 2

## 2020-08-17 MED ORDER — DIPHENHYDRAMINE HCL 25 MG PO CAPS
ORAL_CAPSULE | ORAL | Status: AC
  Filled 2020-08-17: qty 2

## 2020-08-17 MED ORDER — VINCRISTINE SULFATE CHEMO INJECTION 1 MG/ML
2.0000 mg | Freq: Once | INTRAVENOUS | Status: AC
  Administered 2020-08-17: 2 mg via INTRAVENOUS
  Filled 2020-08-17: qty 2

## 2020-08-17 MED ORDER — DIPHENHYDRAMINE HCL 25 MG PO CAPS
50.0000 mg | ORAL_CAPSULE | Freq: Once | ORAL | Status: AC
  Administered 2020-08-17: 50 mg via ORAL

## 2020-08-17 MED ORDER — SODIUM CHLORIDE 0.9 % IV SOLN
10.0000 mg | Freq: Once | INTRAVENOUS | Status: AC
  Administered 2020-08-17: 10 mg via INTRAVENOUS
  Filled 2020-08-17: qty 10

## 2020-08-17 MED ORDER — SODIUM CHLORIDE 0.9 % IV SOLN
750.0000 mg/m2 | Freq: Once | INTRAVENOUS | Status: AC
  Administered 2020-08-17: 1420 mg via INTRAVENOUS
  Filled 2020-08-17: qty 71

## 2020-08-17 MED ORDER — SODIUM CHLORIDE 0.9% FLUSH
10.0000 mL | INTRAVENOUS | Status: DC | PRN
  Filled 2020-08-17: qty 10

## 2020-08-17 NOTE — Progress Notes (Signed)
[place] [phone]   [phone]  PROGRESS NOTE  Patient Care Team: Dettinger, Fransisca Kaufmann, MD as PCP - General (Family Medicine)  Hematological/Oncological History # Diffuse Large B Cell Lymphoma, Staging IV  04/07/2020: presented to the ED with MRI findings concerning for extensive osseous metastatic disease including large volume paraspinal and epidural tumor in the mid and lower thoracic spine  04/08/2020: establish care with Dr. Lorenso Courier while inpatient. Scheduled for laminectomy/biopsy of metastatic spine lesion.   04/08/2020: laminectomy performed, biopsy results show DLBCL.   04/26/2020: PET CT scan shows widespread skeletal involvement of the malignancy with numerous Deauville 5 score osseous lesions.  Hypermetabolic lymph nodes in the neck, chest, abdomen and pelvis. 05/04/2020: Cycle 1 Day 1 of R-CHOP.  05/23/2020: Cycle 2 Day 1 of R-CHOP 06/15/2020: Cycle 3 Day 1 of R-CHOP 07/04/2020: PET CT scan shows a complete metabolic response to therapy with only mild residual Deauville score 2 lymph nodes  07/06/2020: Cycle 4 Day 1 of R-CHOP 07/27/2020: Cycle 5 Day 1 of R-CHOP 08/17/2020: Cycle 6 Day 1 of R-CHOP  Interval History:  Shaun Clarke 60 y.o. 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 The patient's last visit was on 07/27/2020. In the interim since the last visit he has completed Cycle 5.  On exam today Shaun Clarke reports he has continued to struggle with fatigue since his last cycle.  He notes he is having some discomfort in the back of his neck and over his left clavicle.  He describes it as a "nagging pain".  He notes is about a 2 out of 10 in severity.  He has been taking Compazine though he notes he is not having nausea as much is a discomfort in the abdomen which is relieved with the medication.  He notes his last cycle was particular off and that he was spending a lot of time in  bed for the first week.  His appetite development wise been good and his weight is at 185 pounds up from 179 at his prior visit.  He is doing his best to keep up with hydration.  He reports that he otherwise has had no issues other than fatigue. A full 10 point ROS is listed below.  MEDICAL HISTORY:  Past Medical History:  Diagnosis Date   GERD (gastroesophageal reflux disease)     SURGICAL HISTORY: Past Surgical History:  Procedure Laterality Date   APPENDECTOMY     DECOMPRESSIVE LUMBAR LAMINECTOMY LEVEL 2 N/A 04/08/2020   Procedure: THORACIC FIVE-THORACIC SEVEN OPEN LAMINECTOMY FOR RESECTION OF EPIDURAL TUMOR ;  Surgeon: Karsten Ro, DO;  Location: [facility];  Service: Neurosurgery;  Laterality: N/A;   IR IMAGING GUIDED PORT INSERTION  04/29/2020   KNEE ARTHROSCOPY     right knee 3 times   SHOULDER BIOPSY     right shoulder lipoma removal    SOCIAL HISTORY: Social History   Socioeconomic History   Marital status: Married    Spouse name: Not on file   Number of children: 2   Years of education: Not on file   Highest education level: Not on file  Occupational History   Not on file  Tobacco Use   Smoking status: Every Day    Packs/day: 1.00    Types: Cigarettes   Smokeless tobacco: Never  Vaping Use   Vaping Use: Never used  Substance and Sexual Activity   Alcohol use: Yes  Alcohol/week: 3.0 standard drinks    Types: 1 Glasses of wine, 2 Cans of beer per week    Comment: per week   Drug use: Never   Sexual activity: Yes    Partners: Female  Other Topics Concern   Not on file  Social History Narrative   Not on file   Social Determinants of Health   Financial Resource Strain: Not on file  Food Insecurity: Not on file  Transportation Needs: Not on file  Physical Activity: Not on file  Stress: Not on file  Social Connections: Not on file  Intimate Partner Violence: Not on file    FAMILY HISTORY: Family History  Problem Relation Age of Onset   Pancreatic  cancer Mother    Diabetes Father    Anxiety disorder Sister    Multiple sclerosis Sister    COPD Maternal Grandfather     ALLERGIES:  is allergic to naproxen and gadolinium derivatives.  MEDICATIONS:  Current Outpatient Medications  Medication Sig Dispense Refill   acetaminophen (TYLENOL) 500 MG tablet Take 500-1,000 mg by mouth every 6 (six) hours as needed (for pain).     allopurinol (ZYLOPRIM) 300 MG tablet Take 1 tablet (300 mg total) by mouth daily. 90 tablet 1   lidocaine-prilocaine (EMLA) cream Apply 1 application topically as needed. 30 g 0   methocarbamol (ROBAXIN) 500 MG tablet Take 1 tablet (500 mg total) by mouth every 6 (six) hours as needed for muscle spasms. 30 tablet 0   ondansetron (ZOFRAN) 8 MG tablet Take 1 tablet (8 mg total) by mouth every 8 (eight) hours as needed for nausea or vomiting. 30 tablet 0   oxyCODONE-acetaminophen (PERCOCET/ROXICET) 5-325 MG tablet Take 1 tablet by mouth every 4 (four) hours as needed for moderate pain. (Patient not taking: Reported on 08/17/2020) 30 tablet 0   pantoprazole (PROTONIX) 40 MG tablet Take 1 tablet (40 mg total) by mouth 2 (two) times daily before a meal. 180 tablet 1   predniSONE (DELTASONE) 20 MG tablet Take 3 tablets (60 mg total) by mouth as directed. Take 3 tablets in the morning on Day 1 of chemotherapy and for four days after. 15 tablet 3   prochlorperazine (COMPAZINE) 10 MG tablet Take 1 tablet (10 mg total) by mouth every 6 (six) hours as needed for nausea or vomiting. 30 tablet 0   sucralfate (CARAFATE) 1 g tablet Take 1 g by mouth 2 (two) times daily. (Patient not taking: No sig reported)     No current facility-administered medications for this visit.   Facility-Administered Medications Ordered in Other Visits  Medication Dose Route Frequency Provider Last Rate Last Admin   cyclophosphamide (CYTOXAN) 1,420 mg in sodium chloride 0.9 % 250 mL chemo infusion  750 mg/m2 (Treatment Plan Recorded) Intravenous Once Orson Slick, MD       dexamethasone (DECADRON) 10 mg in sodium chloride 0.9 % 50 mL IVPB  10 mg Intravenous Once Narda Rutherford T IV, MD 204 mL/hr at 08/17/20 1140 10 mg at 08/17/20 1140   DOXOrubicin (ADRIAMYCIN) chemo injection 94 mg  50 mg/m2 (Treatment Plan Recorded) Intravenous Once Orson Slick, MD       fosaprepitant (EMEND) 150 mg in sodium chloride 0.9 % 145 mL IVPB  150 mg Intravenous Once Ledell Peoples IV, MD 450 mL/hr at 08/17/20 1141 150 mg at 08/17/20 1141   heparin lock flush 100 unit/mL  500 Units Intracatheter Once PRN Orson Slick, MD  riTUXimab-abbs (TRUXIMA) 700 mg in sodium chloride 0.9 % 250 mL (2.1875 mg/mL) infusion  375 mg/m2 (Treatment Plan Recorded) Intravenous Once Ledell Peoples IV, MD       sodium chloride flush (NS) 0.9 % injection 10 mL  10 mL Intracatheter PRN Orson Slick, MD       vinCRIStine (ONCOVIN) 2 mg in sodium chloride 0.9 % 50 mL chemo infusion  2 mg Intravenous Once Orson Slick, MD        REVIEW OF SYSTEMS:   Constitutional: ( - ) fevers, ( - )  chills , ( - ) night sweats Eyes: ( - ) blurriness of vision, ( - ) double vision, ( - ) watery eyes Ears, nose, mouth, throat, and face: ( - ) mucositis, ( - ) sore throat Respiratory: ( - ) cough, ( - ) dyspnea, ( - ) wheezes Cardiovascular: ( - ) palpitation, ( - ) chest discomfort, ( - ) lower extremity swelling Gastrointestinal:  ( - ) nausea, ( - ) heartburn, ( - ) change in bowel habits Skin: ( - ) abnormal skin rashes Lymphatics: ( - ) new lymphadenopathy, ( - ) easy bruising Neurological: ( - ) numbness, ( - ) tingling, ( - ) new weaknesses Behavioral/Psych: ( - ) mood change, ( - ) new changes  All other systems were reviewed with the patient and are negative.  PHYSICAL EXAMINATION: ECOG PERFORMANCE STATUS: 1 - Symptomatic but completely ambulatory  Vitals:   08/17/20 1015  BP: 106/69  Pulse: 70  Resp: 20  Temp: 97.6 F (36.4 C)  SpO2: 98%     Filed Weights    08/17/20 1015  Weight: 185 lb 14.4 oz (84.3 kg)     GENERAL: well appearing middle aged Caucasian male alert, no distress and comfortable SKIN: skin color, texture, turgor are normal, no rashes or significant lesions EYES: conjunctiva are pink and non-injected, sclera clear LUNGS: clear to auscultation and percussion with normal breathing effort HEART: regular rate & rhythm and no murmurs and no lower extremity edema Musculoskeletal: no cyanosis of digits and no clubbing  PSYCH: alert & oriented x 3, fluent speech NEURO: no focal motor/sensory deficits  LABORATORY DATA:  I have reviewed the data as listed CBC Latest Ref Rng & Units 08/17/2020 07/27/2020 07/06/2020  WBC 4.0 - 10.5 K/uL 5.0 4.6 4.4  Hemoglobin 13.0 - 17.0 g/dL 12.8(L) 12.5(L) 13.2  Hematocrit 39.0 - 52.0 % 38.8(L) 38.3(L) 39.5  Platelets 150 - 400 K/uL 336 340 376    CMP Latest Ref Rng & Units 08/17/2020 07/27/2020 07/06/2020  Glucose 70 - 99 mg/dL 120(H) 111(H) 108(H)  BUN 6 - 20 mg/dL '12 16 14  '$ Creatinine 0.61 - 1.24 mg/dL 0.92 0.85 0.96  Sodium 135 - 145 mmol/L 142 140 141  Potassium 3.5 - 5.1 mmol/L 3.9 4.2 4.2  Chloride 98 - 111 mmol/L 108 108 107  CO2 22 - 32 mmol/L '23 25 25  '$ Calcium 8.9 - 10.3 mg/dL 9.5 9.2 9.2  Total Protein 6.5 - 8.1 g/dL 6.2(L) 6.2(L) 6.3(L)  Total Bilirubin 0.3 - 1.2 mg/dL 0.4 0.5 0.4  Alkaline Phos 38 - 126 U/L 70 64 81  AST 15 - 41 U/L '16 21 16  '$ ALT 0 - 44 U/L '18 26 16    '$ Lab Results  Component Value Date   MPROTEIN Not Observed 04/07/2020   Lab Results  Component Value Date   KPAFRELGTCHN 11.4 04/07/2020   LAMBDASER 9.0 04/07/2020  KAPLAMBRATIO 1.27 04/07/2020     RADIOGRAPHIC STUDIES: No results found.  ASSESSMENT & PLAN Shaun Clarke 60 y.o. 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  After review the labs, the records, schedule the patient the findings most consistent with a diffuse large B-cell  lymphoma with staging in process.  We completed the staging with a PET CT scan which showed his disease does appear as higher stage (III or IV). At this time we will plan for at least 6 cycles of R-CHOP followed by repeat scans.    Previously we discussed the risks and benefits of R-CHOP chemotherapy.  We noted the nature of diffuse large B-cell lymphoma and the possibility of achieving a complete remission with this chemotherapy regimen.  I also noted the side effects which include but are not limited to nausea, vomiting, hair loss, diarrhea, sedation, insomnia, hyperglycemia, high blood pressure, and cardiotoxicity.  The patient voices understanding of the risk and benefits and was agreeable to proceeding with treatment at this time.   The R-CHOP regimen consists of rituximab 375 mg per metered squared IV on day 1, cyclophosphamide 750 mg per metered squared on day 1, doxorubicin 50 mg/m on day 1, vincristine 1.4 mg per metered squared with a max dose of 2 mg on day 1, and prednisone 100 mg orally days 1 through 5.  A cycle lasts for 21 days and number of cycles planned will depend on the stage as determined by the PET CT scan.  IPI Prognostic Index: 2 points Good Prognosis (80% progression free survival)  # Diffuse Large B Cell Lymphoma, Stage IV --Findings are most consistent with a diffuse large B-cell lymphoma metastatic to the spine.  Given his metastatic disease in the spine he is at least a stage III or IV. -- PET CT scan on 07/04/2020 showed complete metabolic response.  --Patient continues recovering well from his neurosurgical procedure --FISH testing negative for double HIT, I would recommend we proceed with R-CHOP chemotherapy as noted above.  --Cycle 1 Day 1 started on 05/04/2020 --today is Cycle 6 Day 1 --Plan for patient return to clinic in 4 weeks with post treatment PET CT scan.   #Supportive Care --chemotherapy education completed --port in place --Echocardiogram performed,  adequate heart function to proceed. --zofran '8mg'$  q8H PRN and compazine '10mg'$  PO q6H for nausea -- OK to d/c allopurinol  -- EMLA cream for port -- no pain medication required at this time.   Orders Placed This Encounter  Procedures   NM PET Image Restag (PS) Skull Base To Thigh    Standing Status:   Future    Standing Expiration Date:   08/17/2021    Order Specific Question:   If indicated for the ordered procedure, I authorize the administration of a radiopharmaceutical per Radiology protocol    Answer:   Yes    Order Specific Question:   Preferred imaging location?    Answer:   [facility]    All questions were answered. The patient knows to call the clinic with any problems, questions or concerns.  A total of more than 30 minutes were spent on this encounter and over half of that time was spent on counseling and coordination of care as outlined above.   Ledell Peoples, MD Department of Hematology/Oncology [place] at [facility] Phone: [phone] Pager: [phone] Email: [email or web address]  08/17/2020 11:43 AM

## 2020-08-17 NOTE — Patient Instructions (Signed)
[facility] ONCOLOGY  Discharge Instructions: Thank you for choosing [facility] to provide your oncology and hematology care.   If you have a lab appointment with the [facility], please go directly to the [facility] and check in at the registration area.   Wear comfortable clothing and clothing appropriate for easy access to any Portacath or PICC line.   We strive to give you quality time with your provider. You may need to reschedule your appointment if you arrive late (15 or more minutes).  Arriving late affects you and other patients whose appointments are after yours.  Also, if you miss three or more appointments without notifying the office, you may be dismissed from the clinic at the provider's discretion.      For prescription refill requests, have your pharmacy contact our office and allow 72 hours for refills to be completed.    Today you received the following chemotherapy and/or immunotherapy agents R-CHOP (Rituxamab, Doxorubicin, Cyclophosphamide, and Vincristine).      To help prevent nausea and vomiting after your treatment, we encourage you to take your nausea medication as directed.  BELOW ARE SYMPTOMS THAT SHOULD BE REPORTED IMMEDIATELY: *FEVER GREATER THAN 100.4 F (38 C) OR HIGHER *CHILLS OR SWEATING *NAUSEA AND VOMITING THAT IS NOT CONTROLLED WITH YOUR NAUSEA MEDICATION *UNUSUAL SHORTNESS OF BREATH *UNUSUAL BRUISING OR BLEEDING *URINARY PROBLEMS (pain or burning when urinating, or frequent urination) *BOWEL PROBLEMS (unusual diarrhea, constipation, pain near the anus) TENDERNESS IN MOUTH AND THROAT WITH OR WITHOUT PRESENCE OF ULCERS (sore throat, sores in mouth, or a toothache) UNUSUAL RASH, SWELLING OR PAIN  UNUSUAL VAGINAL DISCHARGE OR ITCHING   Items with * indicate a potential emergency and should be followed up as soon as possible or go to the Emergency Department if any problems should occur.  Please show the  CHEMOTHERAPY ALERT CARD or IMMUNOTHERAPY ALERT CARD at check-in to the Emergency Department and triage nurse.  Should you have questions after your visit or need to cancel or reschedule your appointment, please contact [facility]  Dept: [phone]  and follow the prompts.  Office hours are 8:00 a.m. to 4:30 p.m. Monday - Friday. Please note that voicemails left after 4:00 p.m. may not be returned until the following business day.  We are closed weekends and major holidays. You have access to a nurse at all times for urgent questions. Please call the main number to the clinic Dept: [phone] and follow the prompts.   For any non-urgent questions, you may also contact your provider using MyChart. We now offer e-Visits for anyone 79 and older to request care online for non-urgent symptoms. For details visit [email or web address].   Also download the MyChart app! Go to the app store, search "MyChart", open the app, select [facility], and log in with your MyChart username and password.  Due to Covid, a mask is required upon entering the hospital/clinic. If you do not have a mask, one will be given to you upon arrival. For doctor visits, patients may have 1 support person aged 28 or older with them. For treatment visits, patients cannot have anyone with them due to current Covid guidelines and our immunocompromised population.

## 2020-08-19 ENCOUNTER — Inpatient Hospital Stay: Payer: 59

## 2020-08-19 ENCOUNTER — Other Ambulatory Visit: Payer: Self-pay

## 2020-08-19 VITALS — BP 106/68 | HR 60 | Temp 98.3°F | Resp 18

## 2020-08-19 DIAGNOSIS — C8339 Diffuse large B-cell lymphoma, extranodal and solid organ sites: Secondary | ICD-10-CM

## 2020-08-19 DIAGNOSIS — Z5111 Encounter for antineoplastic chemotherapy: Secondary | ICD-10-CM | POA: Diagnosis not present

## 2020-08-19 DIAGNOSIS — C83398 Diffuse large b-cell lymphoma of other extranodal and solid organ sites: Secondary | ICD-10-CM

## 2020-08-19 MED ORDER — PEGFILGRASTIM-CBQV 6 MG/0.6ML ~~LOC~~ SOSY
6.0000 mg | PREFILLED_SYRINGE | Freq: Once | SUBCUTANEOUS | Status: AC
  Administered 2020-08-19: 6 mg via SUBCUTANEOUS
  Filled 2020-08-19: qty 0.6

## 2020-09-19 ENCOUNTER — Other Ambulatory Visit: Payer: Self-pay

## 2020-09-19 ENCOUNTER — Ambulatory Visit (HOSPITAL_COMMUNITY)
Admission: RE | Admit: 2020-09-19 | Discharge: 2020-09-19 | Disposition: A | Discharge status: Home / Self Care | Payer: 59 | Source: Ambulatory Visit | Attending: Hematology and Oncology | Admitting: Hematology and Oncology

## 2020-09-19 DIAGNOSIS — C8339 Diffuse large B-cell lymphoma, extranodal and solid organ sites: Secondary | ICD-10-CM | POA: Insufficient documentation

## 2020-09-19 LAB — GLUCOSE, CAPILLARY: Glucose-Capillary: 106 mg/dL — ABNORMAL HIGH (ref 70–99)

## 2020-09-19 MED ORDER — FLUDEOXYGLUCOSE F - 18 (FDG) INJECTION
9.0000 | Freq: Once | INTRAVENOUS | Status: AC | PRN
  Administered 2020-09-19: 9.25 via INTRAVENOUS

## 2020-09-21 ENCOUNTER — Telehealth: Payer: Self-pay | Admitting: *Deleted

## 2020-09-21 ENCOUNTER — Ambulatory Visit (INDEPENDENT_AMBULATORY_CARE_PROVIDER_SITE_OTHER): Payer: 59 | Admitting: Family Medicine

## 2020-09-21 ENCOUNTER — Encounter: Payer: Self-pay | Admitting: Family Medicine

## 2020-09-21 DIAGNOSIS — U071 COVID-19: Secondary | ICD-10-CM

## 2020-09-21 MED ORDER — MOLNUPIRAVIR EUA 200MG CAPSULE: 4.0000 | ORAL_CAPSULE | Freq: Two times a day (BID) | ORAL | 0 refills | Status: AC

## 2020-09-21 NOTE — Progress Notes (Signed)
Virtual Visit via Telephone Note  I connected with Shaun Clarke on 09/21/20 at 11:02 AM by telephone and verified that I am speaking with the correct person using two identifiers. Shaun Clarke is currently located at home and his wife is currently with him during this visit. The provider, Loman Brooklyn, FNP is located in their office at time of visit.  I discussed the limitations, risks, security and privacy concerns of performing an evaluation and management service by telephone and the availability of in person appointments. I also discussed with the patient that there may be a patient responsible charge related to this service. The patient expressed understanding and agreed to proceed.  Subjective: PCP: Dettinger, Fransisca Kaufmann, MD  Chief Complaint  Patient presents with   Covid Positive   Patient complains of cough, fever, and body aches . Additional symptoms include diarrhea. Onset of symptoms was 2 days ago, unchanged since that time. He is drinking plenty of fluids. Evaluation to date: at home COVID test negative this morning. Treatment to date:  Tylenol and Ibuprofen .  He does not smoke.    ROS: Per HPI  Current Outpatient Medications:    acetaminophen (TYLENOL) 500 MG tablet, Take 500-1,000 mg by mouth every 6 (six) hours as needed (for pain)., Disp: , Rfl:    allopurinol (ZYLOPRIM) 300 MG tablet, Take 1 tablet (300 mg total) by mouth daily., Disp: 90 tablet, Rfl: 1   lidocaine-prilocaine (EMLA) cream, Apply 1 application topically as needed., Disp: 30 g, Rfl: 0   methocarbamol (ROBAXIN) 500 MG tablet, Take 1 tablet (500 mg total) by mouth every 6 (six) hours as needed for muscle spasms., Disp: 30 tablet, Rfl: 0   ondansetron (ZOFRAN) 8 MG tablet, Take 1 tablet (8 mg total) by mouth every 8 (eight) hours as needed for nausea or vomiting., Disp: 30 tablet, Rfl: 0   oxyCODONE-acetaminophen (PERCOCET/ROXICET) 5-325 MG tablet, Take 1 tablet by mouth every 4 (four) hours as  needed for moderate pain. (Patient not taking: Reported on 08/17/2020), Disp: 30 tablet, Rfl: 0   pantoprazole (PROTONIX) 40 MG tablet, Take 1 tablet (40 mg total) by mouth 2 (two) times daily before a meal., Disp: 180 tablet, Rfl: 1   predniSONE (DELTASONE) 20 MG tablet, Take 3 tablets (60 mg total) by mouth as directed. Take 3 tablets in the morning on Day 1 of chemotherapy and for four days after., Disp: 15 tablet, Rfl: 3   prochlorperazine (COMPAZINE) 10 MG tablet, Take 1 tablet (10 mg total) by mouth every 6 (six) hours as needed for nausea or vomiting., Disp: 30 tablet, Rfl: 0   sucralfate (CARAFATE) 1 g tablet, Take 1 g by mouth 2 (two) times daily. (Patient not taking: No sig reported), Disp: , Rfl:   Allergies  Allergen Reactions   Naproxen Anaphylaxis, Swelling and Other (See Comments)    Swelling of face and lips    Gadolinium Derivatives Hives and Itching   Past Medical History:  Diagnosis Date   GERD (gastroesophageal reflux disease)     Observations/Objective: A&O  No respiratory distress or wheezing audible over the phone Mood, judgement, and thought processes all WNL  Assessment and Plan: 1. COVID-19 Discussed symptom management and s/s that should prompt him to go to the ER.  - molnupiravir EUA (LAGEVRIO) 200 mg CAPS capsule; Take 4 capsules (800 mg total) by mouth 2 (two) times daily for 5 days.  Dispense: 40 capsule; Refill: 0   Follow Up Instructions:  I discussed the  assessment and treatment plan with the patient. The patient was provided an opportunity to ask questions and all were answered. The patient agreed with the plan and demonstrated an understanding of the instructions.   The patient was advised to call back or seek an in-person evaluation if the symptoms worsen or if the condition fails to improve as anticipated.  The above assessment and management plan was discussed with the patient. The patient verbalized understanding of and has agreed to the  management plan. Patient is aware to call the clinic if symptoms persist or worsen. Patient is aware when to return to the clinic for a follow-up visit. Patient educated on when it is appropriate to go to the emergency department.   Time call ended: 11:13 AM  I provided 11 minutes of non-face-to-face time during this encounter.  Hendricks Limes, MSN, APRN, FNP-C [facility] 09/21/20

## 2020-09-21 NOTE — Telephone Encounter (Signed)
Received call from pt's wife to advise that Elta Guadeloupe has had some fevers in the last day ort so up to 100-101. He was tested for Covid and is positive. TCT Mark and advised that if he decides he wants to take the oral medication for Covid, he would need to call his PCP. From Dr. Libby Maw perspective, he can take this without any problems as he is not immunocompromised at this time.  Also advised that his PET scan results from Monday are back and indicate he has had a good response-clinical/metabolic remission.  Pt very pleased with this.  Advised that I would check to see when Dr. Lorenso Courier needs to see him next. Pt voiced understanding.

## 2020-09-22 ENCOUNTER — Telehealth: Payer: Self-pay | Admitting: Hematology and Oncology

## 2020-09-22 NOTE — Telephone Encounter (Signed)
Scheduled per sch msg. Called and left msg  

## 2020-09-30 ENCOUNTER — Other Ambulatory Visit: Payer: Self-pay | Admitting: *Deleted

## 2020-09-30 ENCOUNTER — Telehealth: Payer: Self-pay | Admitting: *Deleted

## 2020-09-30 DIAGNOSIS — C8339 Diffuse large B-cell lymphoma, extranodal and solid organ sites: Secondary | ICD-10-CM

## 2020-09-30 NOTE — Telephone Encounter (Signed)
Pt called about getting his port removed. He had his PET Scan done earlier in the month with good results. He is ready for his port to come out.  Please advise

## 2020-10-20 ENCOUNTER — Other Ambulatory Visit: Payer: Self-pay | Admitting: Radiology

## 2020-10-21 ENCOUNTER — Other Ambulatory Visit (HOSPITAL_COMMUNITY): Payer: Self-pay | Admitting: Physician Assistant

## 2020-10-24 ENCOUNTER — Other Ambulatory Visit: Payer: Self-pay

## 2020-10-24 ENCOUNTER — Ambulatory Visit (HOSPITAL_COMMUNITY)
Admission: RE | Admit: 2020-10-24 | Discharge: 2020-10-24 | Disposition: A | Discharge status: Home / Self Care | Payer: 59 | Source: Ambulatory Visit | Attending: Hematology and Oncology | Admitting: Hematology and Oncology

## 2020-10-24 ENCOUNTER — Encounter (HOSPITAL_COMMUNITY): Payer: Self-pay

## 2020-10-24 DIAGNOSIS — Z79899 Other long term (current) drug therapy: Secondary | ICD-10-CM | POA: Insufficient documentation

## 2020-10-24 DIAGNOSIS — Z452 Encounter for adjustment and management of vascular access device: Secondary | ICD-10-CM | POA: Insufficient documentation

## 2020-10-24 DIAGNOSIS — Z888 Allergy status to other drugs, medicaments and biological substances status: Secondary | ICD-10-CM | POA: Diagnosis not present

## 2020-10-24 DIAGNOSIS — C8339 Diffuse large B-cell lymphoma, extranodal and solid organ sites: Secondary | ICD-10-CM | POA: Insufficient documentation

## 2020-10-24 DIAGNOSIS — Z87891 Personal history of nicotine dependence: Secondary | ICD-10-CM | POA: Insufficient documentation

## 2020-10-24 DIAGNOSIS — I890 Lymphedema, not elsewhere classified: Secondary | ICD-10-CM | POA: Diagnosis not present

## 2020-10-24 DIAGNOSIS — Z886 Allergy status to analgesic agent status: Secondary | ICD-10-CM | POA: Diagnosis not present

## 2020-10-24 HISTORY — PX: IR REMOVAL TUN ACCESS W/ PORT W/O FL MOD SED: IMG2290

## 2020-10-24 MED ORDER — SODIUM CHLORIDE 0.9 % IV SOLN: INTRAVENOUS | Status: DC

## 2020-10-24 MED ORDER — LIDOCAINE-EPINEPHRINE 1 %-1:100000 IJ SOLN
INTRAMUSCULAR | Status: AC
  Administered 2020-10-24: 7 mL via SUBCUTANEOUS
  Filled 2020-10-24: qty 1

## 2020-10-24 MED ORDER — FENTANYL CITRATE (PF) 100 MCG/2ML IJ SOLN
INTRAMUSCULAR | Status: AC
  Filled 2020-10-24: qty 2

## 2020-10-24 MED ORDER — LIDOCAINE-EPINEPHRINE 1 %-1:100000 IJ SOLN
INTRAMUSCULAR | Status: DC | PRN
  Administered 2020-10-24: 20 mL

## 2020-10-24 MED ORDER — MIDAZOLAM HCL 2 MG/2ML IJ SOLN
INTRAMUSCULAR | Status: DC | PRN
  Administered 2020-10-24 (×2): 1 mg via INTRAVENOUS

## 2020-10-24 MED ORDER — FENTANYL CITRATE (PF) 100 MCG/2ML IJ SOLN
INTRAMUSCULAR | Status: DC | PRN
  Administered 2020-10-24 (×2): 50 ug via INTRAVENOUS

## 2020-10-24 MED ORDER — MIDAZOLAM HCL 2 MG/2ML IJ SOLN
INTRAMUSCULAR | Status: AC
  Filled 2020-10-24: qty 2

## 2020-10-24 NOTE — Discharge Instructions (Signed)
Interventional radiology phone numbers [phone] After hours [phone]  Implanted Port Removal, Care After This sheet gives you information about how to care for yourself after your procedure. Your health care provider may also give you more specific instructions. If you have problems or questions, contact your health care provider. What can I expect after the procedure? After the procedure, it is common to have: Soreness or pain near your incision. Some swelling or bruising near your incision. Follow these instructions at home: Medicines Take over-the-counter and prescription medicines only as told by your health care provider. If you were prescribed an antibiotic medicine, take it as told by your health care provider. Do not stop taking the antibiotic even if you start to feel better. Bathing Do not take baths, swim, or use a hot tub until your health care provider approves. You may shower tomorrow. Remove your dressing prior to your shower. Incision care Follow instructions from your health care provider about how to take care of your incision. Make sure you: Wash your hands with soap and water before you change your bandage (dressing). If soap and water are not available, use hand sanitizer. Keep your dressing dry. Leave  skin glue in place. These skin closures may need to stay in place for 2 weeks or longer. . Check your incision area every day for signs of infection. Check for: More redness, swelling, or pain. More fluid or blood. Warmth. Pus or a bad smell.   Driving Do not drive for 24 hours if you were given a medicine to help you relax (sedative) during your procedure. If you did not receive a sedative, ask your health care provider when it is safe to drive.   Activity Return to your normal activities as told by your health care provider. Ask your health care provider what activities are safe for you. Do not lift anything that is heavier than 10 lb (4.5 kg), or the  limit that you are told, until your health care provider says that it is safe. Do not do activities that involve lifting your arms over your head. General instructions Do not use any products that contain nicotine or tobacco, such as cigarettes and e-cigarettes. These can delay healing. If you need help quitting, ask your health care provider. Keep all follow-up visits as told by your health care provider. This is important. Contact a health care provider if: You have more redness, swelling, or pain around your incision. You have more fluid or blood coming from your incision. Your incision feels warm to the touch. You have pus or a bad smell coming from your incision. You have pain that is not relieved by your pain medicine. Get help right away if you have: A fever or chills. Chest pain. Difficulty breathing. Summary After the procedure, it is common to have pain, soreness, swelling, or bruising near your incision. If you were prescribed an antibiotic medicine, take it as told by your health care provider. Do not stop taking the antibiotic even if you start to feel better. Do not drive for 24 hours if you were given a sedative during your procedure. Return to your normal activities as told by your health care provider. Ask your health care provider what activities are safe for you. This information is not intended to replace advice given to you by your health care provider. Make sure you discuss any questions you have with your health care provider. Document Revised: 02/07/2017 Document Reviewed: 02/07/2017 Elsevier Patient Education  2021 Else[employer] Inc[employer]       Moderate Conscious Sedation, Adult, Care After This sheet gives you information about how to care for yourself after your procedure. Your health care provider may also give you more specific instructions. If you have problems or questions, contact your health care provider. What can I expect after the procedure? After the procedure,  it is common to have: Sleepiness for several hours. Impaired judgment for several hours. Difficulty with balance. Vomiting if you eat too soon. Follow these instructions at home: For the time period you were told by your health care provider: Rest. Do not participate in activities where you could fall or become injured. Do not drive or use machinery. Do not drink alcohol. Do not take sleeping pills or medicines that cause drowsiness. Do not make important decisions or sign legal documents. Do not take care of children on your own.      Eating and drinking Follow the diet recommended by your health care provider. Drink enough fluid to keep your urine pale yellow. If you vomit: Drink water, juice, or soup when you can drink without vomiting. Make sure you have little or no nausea before eating solid foods.   General instructions Take over-the-counter and prescription medicines only as told by your health care provider. Have a responsible adult stay with you for the time you are told. It is important to have someone help care for you until you are awake and alert. Do not smoke. Keep all follow-up visits as told by your health care provider. This is important. Contact a health care provider if: You are still sleepy or having trouble with balance after 24 hours. You feel light-headed. You keep feeling nauseous or you keep vomiting. You develop a rash. You have a fever. You have redness or swelling around the IV site. Get help right away if: You have trouble breathing. You have new-onset confusion at home. Summary After the procedure, it is common to feel sleepy, have impaired judgment, or feel nauseous if you eat too soon. Rest after you get home. Know the things you should not do after the procedure. Follow the diet recommended by your health care provider and drink enough fluid to keep your urine pale yellow. Get help right away if you have trouble breathing or new-onset  confusion at home. This information is not intended to replace advice given to you by your health care provider. Make sure you discuss any questions you have with your health care provider. Document Revised: 04/24/2019 Document Reviewed: 11/20/2018 Elsevier Patient Education  2021 Elsevier Inc.  

## 2020-10-24 NOTE — Procedures (Signed)
Vascular and Interventional Radiology Procedure Note  Patient: Shaun Clarke DOB: 16-Jun-1960 Medical Record Number: [id number] Note Date/Time: 10/24/20 3:30 PM   Performing Physician: Michaelle Birks, MD Assistant(s): None  Diagnosis:  DLBCL. Rx completion.   Procedure: PORT REMOVAL  Anesthesia: Conscious Sedation Complications: None Estimated Blood Loss:  <3 mL Specimens:  None  Findings:  Successful removal of a right-sided venous port. Primary incision closure. Dermabond at skin.  See detailed procedure note with images in PACS. The patient tolerated the procedure well without incident or complication and was returned to [facility] in stable condition.    Michaelle Birks, MD [facility]   Pager. [place]

## 2020-10-24 NOTE — H&P (Signed)
Chief Complaint: Patient was seen in consultation today for port removal at the request of Cynthiana T IV  Referring Physician(s): Dorsey,John T IV  Supervising Physician: Michaelle Birks  Patient Status: [place]  History of Present Illness: Shaun Clarke is a 59 y.o. male with hx of Lymphoma. He had a port placed back in April by Dr. Dwaine Gale and has now completed treatment. He is referred for port removal. PMHx, meds, labs, imaging, allergies reviewed. Feels well, no recent fevers, chills, illness. Has been NPO today as directed.    Past Medical History:  Diagnosis Date   GERD (gastroesophageal reflux disease)     Past Surgical History:  Procedure Laterality Date   APPENDECTOMY     DECOMPRESSIVE LUMBAR LAMINECTOMY LEVEL 2 N/A 04/08/2020   Procedure: THORACIC FIVE-THORACIC SEVEN OPEN LAMINECTOMY FOR RESECTION OF EPIDURAL TUMOR ;  Surgeon: Karsten Ro, DO;  Location: [place];  Service: Neurosurgery;  Laterality: N/A;   IR IMAGING GUIDED PORT INSERTION  04/29/2020   KNEE ARTHROSCOPY     right knee 3 times   SHOULDER BIOPSY     right shoulder lipoma removal    Allergies: Naproxen and Gadolinium derivatives  Medications: Prior to Admission medications   Medication Sig Start Date End Date Taking? Authorizing Provider  acetaminophen (TYLENOL) 500 MG tablet Take 500-1,000 mg by mouth every 6 (six) hours as needed (for pain).   Yes [provider]  methocarbamol (ROBAXIN) 500 MG tablet Take 1 tablet (500 mg total) by mouth every 6 (six) hours as needed for muscle spasms. 04/11/20  Yes Kathie Dike, MD  pantoprazole (PROTONIX) 40 MG tablet Take 1 tablet (40 mg total) by mouth 2 (two) times daily before a meal. 05/02/20  Yes Dettinger, Fransisca Kaufmann, MD  prochlorperazine (COMPAZINE) 10 MG tablet Take 1 tablet (10 mg total) by mouth every 6 (six) hours as needed for nausea or vomiting. 08/17/20  Yes Orson Slick, MD  allopurinol (ZYLOPRIM) 300 MG tablet Take 1  tablet (300 mg total) by mouth daily. 04/19/20   Orson Slick, MD  lidocaine-prilocaine (EMLA) cream Apply 1 application topically as needed. 04/19/20   Orson Slick, MD  ondansetron (ZOFRAN) 8 MG tablet Take 1 tablet (8 mg total) by mouth every 8 (eight) hours as needed for nausea or vomiting. 04/19/20   Orson Slick, MD  oxyCODONE-acetaminophen (PERCOCET/ROXICET) 5-325 MG tablet Take 1 tablet by mouth every 4 (four) hours as needed for moderate pain. Patient not taking: Reported on 08/17/2020 04/11/20   Kathie Dike, MD  predniSONE (DELTASONE) 20 MG tablet Take 3 tablets (60 mg total) by mouth as directed. Take 3 tablets in the morning on Day 1 of chemotherapy and for four days after. 07/06/20   Orson Slick, MD  sucralfate (CARAFATE) 1 g tablet Take 1 g by mouth 2 (two) times daily. Patient not taking: No sig reported    [provider]     Family History  Problem Relation Age of Onset   Pancreatic cancer Mother    Diabetes Father    Anxiety disorder Sister    Multiple sclerosis Sister    COPD Maternal Grandfather     Social History   Socioeconomic History   Marital status: Married    Spouse name: Not on file   Number of children: 2   Years of education: Not on file   Highest education level: Not on file  Occupational History   Not on  file  Tobacco Use   Smoking status: Former    Packs/day: 1.00    Types: Cigarettes   Smokeless tobacco: Never  Vaping Use   Vaping Use: Never used  Substance and Sexual Activity   Alcohol use: Yes    Alcohol/week: 3.0 standard drinks    Types: 1 Glasses of wine, 2 Cans of beer per week    Comment: per week   Drug use: Never   Sexual activity: Yes    Partners: Female  Other Topics Concern   Not on file  Social History Narrative   Not on file   Social Determinants of Health   Financial Resource Strain: Not on file  Food Insecurity: Not on file  Transportation Needs: Not on file  Physical Activity: Not on  file  Stress: Not on file  Social Connections: Not on file     Review of Systems: A 12 point ROS discussed and pertinent positives are indicated in the HPI above.  All other systems are negative.  Review of Systems  Vital Signs: BP 114/83   Pulse 62   Temp 98.4 F (36.9 C) (Oral)   Resp 16   SpO2 94%   Physical Exam Constitutional:      Appearance: Normal appearance. He is not ill-appearing.  HENT:     Mouth/Throat:     Mouth: Mucous membranes are moist.     Pharynx: Oropharynx is clear.  Cardiovascular:     Rate and Rhythm: Normal rate and regular rhythm.     Heart sounds: Normal heart sounds.  Pulmonary:     Effort: Pulmonary effort is normal. No respiratory distress.     Breath sounds: Normal breath sounds.  Skin:    Comments: (R)chest port palpable Incision well healed  Neurological:     General: No focal deficit present.     Mental Status: He is alert and oriented to person, place, and time.  Psychiatric:        Mood and Affect: Mood normal.        Thought Content: Thought content normal.        Judgment: Judgment normal.      Imaging: No results found.  Labs:  CBC: Recent Labs    06/15/20 0758 07/06/20 0807 07/27/20 1048 08/17/20 1016  WBC 4.8 4.4 4.6 5.0  HGB 13.8 13.2 12.5* 12.8*  HCT 41.6 39.5 38.3* 38.8*  PLT 398 376 340 336    COAGS: No results for input(s): INR, APTT in the last 8760 hours.  BMP: Recent Labs    02/17/20 1703 04/07/20 1452 06/15/20 0758 07/06/20 0807 07/27/20 1048 08/17/20 1016  NA 138   < > 142 141 140 142  K 4.2   < > 4.2 4.2 4.2 3.9  CL 103   < > 105 107 108 108  CO2 21   < > 24 25 25 23   GLUCOSE 91   < > 120* 108* 111* 120*  BUN 14   < > 14 14 16 12   CALCIUM 9.7   < > 9.6 9.2 9.2 9.5  CREATININE 1.02   < > 1.11 0.96 0.85 0.92  GFRNONAA 81   < > >60 >60 >60 >60  GFRAA 93  --   --   --   --   --    < > = values in this interval not displayed.    LIVER FUNCTION TESTS: Recent Labs    06/15/20 0758  07/06/20 0807 07/27/20 1048 08/17/20 1016  BILITOT 0.4  0.4 0.5 0.4  AST 20 16 21 16   ALT 18 16 26 18   ALKPHOS 79 81 64 70  PROT 6.8 6.3* 6.2* 6.2*  ALBUMIN 3.7 3.5 3.7 3.6    TUMOR MARKERS: No results for input(s): AFPTM, CEA, CA199, CHROMGRNA in the last 8760 hours.  Assessment and Plan: Hx of Lymphoma Treatment complete Plan for port removal. Risks and benefits of port-a-catheter removal was discussed with the patient including, but not limited to bleeding, infection, pneumothorax, or fibrin sheath development and need for additional procedures.  All of the patient's questions were answered, patient is agreeable to proceed. Consent signed and in chart.   Thank you for this interesting consult.  I greatly enjoyed meeting Shaun Clarke and look forward to participating in their care.  A copy of this report was sent to the requesting provider on this date.  Electronically Signed: Ascencion Dike, PA-C 10/24/2020, 1:27 PM   I spent a total of 20 minutes in face to face in clinical consultation, greater than 50% of which was counseling/coordinating care for port removal

## 2020-11-09 ENCOUNTER — Other Ambulatory Visit: Payer: Self-pay | Admitting: Family Medicine

## 2020-11-09 DIAGNOSIS — K219 Gastro-esophageal reflux disease without esophagitis: Secondary | ICD-10-CM

## 2020-12-22 ENCOUNTER — Inpatient Hospital Stay: Payer: 59

## 2020-12-22 ENCOUNTER — Other Ambulatory Visit: Payer: Self-pay

## 2020-12-22 ENCOUNTER — Inpatient Hospital Stay: Payer: 59 | Attending: Hematology and Oncology | Admitting: Hematology and Oncology

## 2020-12-22 VITALS — BP 117/67 | HR 73 | Temp 97.4°F | Resp 17 | Wt 190.4 lb

## 2020-12-22 DIAGNOSIS — C8339 Diffuse large B-cell lymphoma, extranodal and solid organ sites: Secondary | ICD-10-CM

## 2020-12-22 DIAGNOSIS — Z95828 Presence of other vascular implants and grafts: Secondary | ICD-10-CM | POA: Diagnosis not present

## 2020-12-22 DIAGNOSIS — Z808 Family history of malignant neoplasm of other organs or systems: Secondary | ICD-10-CM | POA: Diagnosis not present

## 2020-12-22 DIAGNOSIS — C8330 Diffuse large B-cell lymphoma, unspecified site: Secondary | ICD-10-CM | POA: Diagnosis present

## 2020-12-22 DIAGNOSIS — Z87891 Personal history of nicotine dependence: Secondary | ICD-10-CM | POA: Insufficient documentation

## 2020-12-22 DIAGNOSIS — C7951 Secondary malignant neoplasm of bone: Secondary | ICD-10-CM | POA: Diagnosis not present

## 2020-12-22 LAB — CBC WITH DIFFERENTIAL (CANCER CENTER ONLY)
Abs Immature Granulocytes: 0 10*3/uL (ref 0.00–0.07)
Basophils Absolute: 0.1 10*3/uL (ref 0.0–0.1)
Basophils Relative: 2 %
Eosinophils Absolute: 0.1 10*3/uL (ref 0.0–0.5)
Eosinophils Relative: 3 %
HCT: 44.4 % (ref 39.0–52.0)
Hemoglobin: 15 g/dL (ref 13.0–17.0)
Immature Granulocytes: 0 %
Lymphocytes Relative: 21 %
Lymphs Abs: 0.8 10*3/uL (ref 0.7–4.0)
MCH: 28.4 pg (ref 26.0–34.0)
MCHC: 33.8 g/dL (ref 30.0–36.0)
MCV: 84.1 fL (ref 80.0–100.0)
Monocytes Absolute: 0.5 10*3/uL (ref 0.1–1.0)
Monocytes Relative: 15 %
Neutro Abs: 2.1 10*3/uL (ref 1.7–7.7)
Neutrophils Relative %: 59 %
Platelet Count: 223 10*3/uL (ref 150–400)
RBC: 5.28 MIL/uL (ref 4.22–5.81)
RDW: 14 % (ref 11.5–15.5)
WBC Count: 3.6 10*3/uL — ABNORMAL LOW (ref 4.0–10.5)
nRBC: 0 % (ref 0.0–0.2)

## 2020-12-22 LAB — CMP (CANCER CENTER ONLY)
ALT: 17 U/L (ref 0–44)
AST: 19 U/L (ref 15–41)
Albumin: 4.1 g/dL (ref 3.5–5.0)
Alkaline Phosphatase: 67 U/L (ref 38–126)
Anion gap: 9 (ref 5–15)
BUN: 14 mg/dL (ref 6–20)
CO2: 23 mmol/L (ref 22–32)
Calcium: 9.2 mg/dL (ref 8.9–10.3)
Chloride: 111 mmol/L (ref 98–111)
Creatinine: 1.06 mg/dL (ref 0.61–1.24)
GFR, Estimated: >60 mL/min (ref >60)
Glucose, Bld: 91 mg/dL (ref 70–99)
Potassium: 4.9 mmol/L (ref 3.5–5.1)
Sodium: 143 mmol/L (ref 135–145)
Total Bilirubin: 0.6 mg/dL (ref 0.3–1.2)
Total Protein: 6.6 g/dL (ref 6.5–8.1)

## 2020-12-22 LAB — LACTATE DEHYDROGENASE: LDH: 170 U/L (ref 98–192)

## 2020-12-22 NOTE — Progress Notes (Signed)
[place] [phone]   [phone]  PROGRESS NOTE  Patient Care Team: Dettinger, Fransisca Kaufmann, MD as PCP - General (Family Medicine)  Hematological/Oncological History # Diffuse Large B Cell Lymphoma, Staging IV  04/07/2020: presented to the ED with MRI findings concerning for extensive osseous metastatic disease including large volume paraspinal and epidural tumor in the mid and lower thoracic spine  04/08/2020: establish care with Dr. Lorenso Courier while inpatient. Scheduled for laminectomy/biopsy of metastatic spine lesion.   04/08/2020: laminectomy performed, biopsy results show DLBCL.   04/26/2020: PET CT scan shows widespread skeletal involvement of the malignancy with numerous Deauville 5 score osseous lesions.  Hypermetabolic lymph nodes in the neck, chest, abdomen and pelvis. 05/04/2020: Cycle 1 Day 1 of R-CHOP.  05/23/2020: Cycle 2 Day 1 of R-CHOP 06/15/2020: Cycle 3 Day 1 of R-CHOP 07/04/2020: PET CT scan shows a complete metabolic response to therapy with only mild residual Deauville score 2 lymph nodes  07/06/2020: Cycle 4 Day 1 of R-CHOP 07/27/2020: Cycle 5 Day 1 of R-CHOP 08/17/2020: Cycle 6 Day 1 of R-CHOP 09/19/2020: post treatment PET CT scan shows no evidence of progressive disease. Deauville 2.  Interval History:  Shaun Clarke 60 y.o. 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 The patient's last visit was on 08/17/2020. In the interim since the last visit he has completed Cycle 6 and had a post treatment PET CT scan that showed no residual/recurrent disease.  On exam today Shaun Clarke reports he has been feeling well overall in the interim since his last visit in September 2022.  He notes that he continues to have some neurological symptoms and is unsure if these are due to the surgery or due to chemotherapy.  He notes he has some numbness in his hands and feet.  He also has some chronic  issues with bone pain and muscle pain, particular in the back of the neck.  He reports that he otherwise has had no issues other than fatigue, but he did have a COVID infection in the interim since our last visit.  His weight has been increasing and is up to 190 pounds.  He denies any fevers, chills, sweats, nausea, vomiting or diarrhea.  A full 10 point ROS is listed below.  MEDICAL HISTORY:  Past Medical History:  Diagnosis Date   GERD (gastroesophageal reflux disease)     SURGICAL HISTORY: Past Surgical History:  Procedure Laterality Date   APPENDECTOMY     DECOMPRESSIVE LUMBAR LAMINECTOMY LEVEL 2 N/A 04/08/2020   Procedure: THORACIC FIVE-THORACIC SEVEN OPEN LAMINECTOMY FOR RESECTION OF EPIDURAL TUMOR ;  Surgeon: Karsten Ro, DO;  Location: [facility];  Service: Neurosurgery;  Laterality: N/A;   IR IMAGING GUIDED PORT INSERTION  04/29/2020   IR REMOVAL TUN ACCESS W/ PORT W/O FL MOD SED  10/24/2020   KNEE ARTHROSCOPY     right knee 3 times   SHOULDER BIOPSY     right shoulder lipoma removal    SOCIAL HISTORY: Social History   Socioeconomic History   Marital status: Married    Spouse name: Not on file   Number of children: 2   Years of education: Not on file   Highest education level: Not on file  Occupational History   Not on file  Tobacco Use   Smoking status: Former    Packs/day: 1.00    Types: Cigarettes   Smokeless tobacco: Never  Vaping Use  Vaping Use: Never used  Substance and Sexual Activity   Alcohol use: Yes    Alcohol/week: 3.0 standard drinks    Types: 1 Glasses of wine, 2 Cans of beer per week    Comment: per week   Drug use: Never   Sexual activity: Yes    Partners: Female  Other Topics Concern   Not on file  Social History Narrative   Not on file   Social Determinants of Health   Financial Resource Strain: Not on file  Food Insecurity: Not on file  Transportation Needs: Not on file  Physical Activity: Not on file  Stress: Not on file  Social  Connections: Not on file  Intimate Partner Violence: Not on file    FAMILY HISTORY: Family History  Problem Relation Age of Onset   Pancreatic cancer Mother    Diabetes Father    Anxiety disorder Sister    Multiple sclerosis Sister    COPD Maternal Grandfather     ALLERGIES:  is allergic to naproxen and gadolinium derivatives.  MEDICATIONS:  Current Outpatient Medications  Medication Sig Dispense Refill   acetaminophen (TYLENOL) 500 MG tablet Take 500-1,000 mg by mouth every 6 (six) hours as needed (for pain).     allopurinol (ZYLOPRIM) 300 MG tablet Take 1 tablet (300 mg total) by mouth daily. 90 tablet 1   lidocaine-prilocaine (EMLA) cream Apply 1 application topically as needed. 30 g 0   methocarbamol (ROBAXIN) 500 MG tablet Take 1 tablet (500 mg total) by mouth every 6 (six) hours as needed for muscle spasms. 30 tablet 0   ondansetron (ZOFRAN) 8 MG tablet Take 1 tablet (8 mg total) by mouth every 8 (eight) hours as needed for nausea or vomiting. 30 tablet 0   oxyCODONE-acetaminophen (PERCOCET/ROXICET) 5-325 MG tablet Take 1 tablet by mouth every 4 (four) hours as needed for moderate pain. (Patient not taking: Reported on 08/17/2020) 30 tablet 0   pantoprazole (PROTONIX) 40 MG tablet Take 1 tablet (40 mg total) by mouth 2 (two) times daily. (NEEDS TO BE SEEN BEFORE NEXT REFILL) 60 tablet 0   predniSONE (DELTASONE) 20 MG tablet Take 3 tablets (60 mg total) by mouth as directed. Take 3 tablets in the morning on Day 1 of chemotherapy and for four days after. 15 tablet 3   prochlorperazine (COMPAZINE) 10 MG tablet Take 1 tablet (10 mg total) by mouth every 6 (six) hours as needed for nausea or vomiting. 30 tablet 0   sucralfate (CARAFATE) 1 g tablet Take 1 g by mouth 2 (two) times daily. (Patient not taking: No sig reported)     No current facility-administered medications for this visit.    REVIEW OF SYSTEMS:   Constitutional: ( - ) fevers, ( - )  chills , ( - ) night sweats Eyes: (  - ) blurriness of vision, ( - ) double vision, ( - ) watery eyes Ears, nose, mouth, throat, and face: ( - ) mucositis, ( - ) sore throat Respiratory: ( - ) cough, ( - ) dyspnea, ( - ) wheezes Cardiovascular: ( - ) palpitation, ( - ) chest discomfort, ( - ) lower extremity swelling Gastrointestinal:  ( - ) nausea, ( - ) heartburn, ( - ) change in bowel habits Skin: ( - ) abnormal skin rashes Lymphatics: ( - ) new lymphadenopathy, ( - ) easy bruising Neurological: ( - ) numbness, ( - ) tingling, ( - ) new weaknesses Behavioral/Psych: ( - ) mood change, ( - )  new changes  All other systems were reviewed with the patient and are negative.  PHYSICAL EXAMINATION: ECOG PERFORMANCE STATUS: 1 - Symptomatic but completely ambulatory  Vitals:   12/22/20 0835  BP: 117/67  Pulse: 73  Resp: 17  Temp: (!) 97.4 F (36.3 C)  SpO2: 95%     Filed Weights   12/22/20 0835  Weight: 190 lb 6.4 oz (86.4 kg)   GENERAL: well appearing middle aged Caucasian male alert, no distress and comfortable SKIN: skin color, texture, turgor are normal, no rashes or significant lesions EYES: conjunctiva are pink and non-injected, sclera clear LUNGS: clear to auscultation and percussion with normal breathing effort HEART: regular rate & rhythm and no murmurs and no lower extremity edema Musculoskeletal: no cyanosis of digits and no clubbing  PSYCH: alert & oriented x 3, fluent speech NEURO: no focal motor/sensory deficits  LABORATORY DATA:  I have reviewed the data as listed CBC Latest Ref Rng & Units 12/22/2020 08/17/2020 07/27/2020  WBC 4.0 - 10.5 K/uL 3.6(L) 5.0 4.6  Hemoglobin 13.0 - 17.0 g/dL 15.0 12.8(L) 12.5(L)  Hematocrit 39.0 - 52.0 % 44.4 38.8(L) 38.3(L)  Platelets 150 - 400 K/uL 223 336 340    CMP Latest Ref Rng & Units 12/22/2020 08/17/2020 07/27/2020  Glucose 70 - 99 mg/dL 91 120(H) 111(H)  BUN 6 - 20 mg/dL 14 12 16   Creatinine 0.61 - 1.24 mg/dL 1.06 0.92 0.85  Sodium 135 - 145 mmol/L 143 142 140   Potassium 3.5 - 5.1 mmol/L 4.9 3.9 4.2  Chloride 98 - 111 mmol/L 111 108 108  CO2 22 - 32 mmol/L 23 23 25   Calcium 8.9 - 10.3 mg/dL 9.2 9.5 9.2  Total Protein 6.5 - 8.1 g/dL 6.6 6.2(L) 6.2(L)  Total Bilirubin 0.3 - 1.2 mg/dL 0.6 0.4 0.5  Alkaline Phos 38 - 126 U/L 67 70 64  AST 15 - 41 U/L 19 16 21   ALT 0 - 44 U/L 17 18 26     Lab Results  Component Value Date   MPROTEIN Not Observed 04/07/2020   Lab Results  Component Value Date   KPAFRELGTCHN 11.4 04/07/2020   LAMBDASER 9.0 04/07/2020   KAPLAMBRATIO 1.27 04/07/2020     RADIOGRAPHIC STUDIES: No results found.  ASSESSMENT & PLAN Shaun Clarke 60 y.o. 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  After review the labs, the records, schedule the patient the findings most consistent with a diffuse large B-cell lymphoma with staging in process.  We completed the staging with a PET CT scan which showed his disease does appear as higher stage (III or IV). At this time we will plan for at least 6 cycles of R-CHOP followed by repeat scans.    Previously we discussed the risks and benefits of R-CHOP chemotherapy.  We noted the nature of diffuse large B-cell lymphoma and the possibility of achieving a complete remission with this chemotherapy regimen.  I also noted the side effects which include but are not limited to nausea, vomiting, hair loss, diarrhea, sedation, insomnia, hyperglycemia, high blood pressure, and cardiotoxicity.  The patient voices understanding of the risk and benefits and was agreeable to proceeding with treatment at this time.   The R-CHOP regimen consists of rituximab 375 mg per metered squared IV on day 1, cyclophosphamide 750 mg per metered squared on day 1, doxorubicin 50 mg/m on day 1, vincristine 1.4 mg per metered squared with a max dose of 2  mg on day 1, and prednisone 100 mg orally days 1 through 5.  A cycle lasts for 21 days and number of  cycles planned will depend on the stage as determined by the PET CT scan.  IPI Prognostic Index: 2 points Good Prognosis (80% progression free survival)  # Diffuse Large B Cell Lymphoma, Stage IV --Findings are most consistent with a diffuse large B-cell lymphoma metastatic to the spine.  Given his metastatic disease in the spine he is at least a stage III or IV. -- PET CT scan on 07/04/2020 showed complete metabolic response.  Similar results on posttreatment PET CT scan in September 2020 --Patient continues recovering well from his neurosurgical procedure --FISH testing negative for double HIT, we proceeded with R-CHOP chemotherapy as noted above.  --Cycle 1 Day 1 started on 05/04/2020 --Plan for patient return to clinic in 12 weeks with CT scan q 6 months (due at next visit)   #Supportive Care --chemotherapy education completed --port in place --Echocardiogram performed, adequate heart function to proceed. --zofran 8mg  q8H PRN and compazine 10mg  PO q6H for nausea -- OK to d/c allopurinol  -- EMLA cream for port -- no pain medication required at this time.   Orders Placed This Encounter  Procedures   CT CHEST ABDOMEN PELVIS W CONTRAST    Standing Status:   Future    Standing Expiration Date:   12/24/2021    Order Specific Question:   Preferred imaging location?    Answer:   [facility]    Order Specific Question:   Is Oral Contrast requested for this exam?    Answer:   Yes, Per Radiology protocol     All questions were answered. The patient knows to call the clinic with any problems, questions or concerns.  A total of more than 30 minutes were spent on this encounter and over half of that time was spent on counseling and coordination of care as outlined above.   Ledell Peoples, MD Department of Hematology/Oncology [place] [facility] Phone: [phone] Pager: [phone] Email: [email or web address]  12/24/2020 4:55 PM

## 2020-12-24 ENCOUNTER — Encounter: Payer: Self-pay | Admitting: Hematology and Oncology

## 2020-12-27 ENCOUNTER — Telehealth: Payer: Self-pay | Admitting: Hematology and Oncology

## 2020-12-27 NOTE — Telephone Encounter (Signed)
Scheduled per sch msg. Called and left msg. Mailed printout  

## 2021-01-05 ENCOUNTER — Encounter: Payer: Self-pay | Admitting: Hematology and Oncology

## 2021-02-20 ENCOUNTER — Encounter: Payer: Self-pay | Admitting: Hematology and Oncology

## 2021-02-20 ENCOUNTER — Ambulatory Visit: Payer: BC Managed Care – PPO | Admitting: Family

## 2021-02-20 ENCOUNTER — Encounter: Payer: Self-pay | Admitting: Family

## 2021-02-20 VITALS — BP 99/67 | HR 94 | Wt 186.0 lb

## 2021-02-20 DIAGNOSIS — B029 Zoster without complications: Secondary | ICD-10-CM | POA: Diagnosis not present

## 2021-02-20 MED ORDER — VALACYCLOVIR HCL 1 G PO TABS: 1000.0000 mg | ORAL_TABLET | Freq: Three times a day (TID) | ORAL | 0 refills | Status: AC

## 2021-02-20 NOTE — Patient Instructions (Signed)
Shingles ?Shingles, which is also known as herpes zoster, is an infection that causes a painful skin rash and fluid-filled blisters. It is caused by a virus. ?Shingles only develops in people who: ?Have had chickenpox. ?Have been vaccinated against chickenpox. Shingles is rare in this group. ?What are the causes? ?Shingles is caused by varicella-zoster virus. This is the same virus that causes chickenpox. After a person is exposed to the virus, it stays in the body in an inactive (dormant) state. Shingles develops if the virus is reactivated. This can happen many years after the first (initial) exposure to the virus. It is not known what causes this virus to be reactivated. ?What increases the risk? ?People who have had chickenpox or received the chickenpox vaccine are at risk for shingles. Shingles infection is more common in people who: ?Are older than 60 years of age. ?Have a weakened disease-fighting system (immune system), such as people with: ?HIV (human immunodeficiency virus). ?AIDS (acquired immunodeficiency syndrome). ?Cancer. ?Are taking medicines that weaken the immune system, such as organ transplant medicines. ?Are experiencing a lot of stress. ?What are the signs or symptoms? ?Early symptoms of this condition include itching, tingling, and pain in an area on your skin. Pain may be described as burning, stabbing, or throbbing. ?A few days or weeks after early symptoms start, a painful red rash appears. The rash is usually on one side of the body and has a band-like or belt-like pattern. The rash eventually turns into fluid-filled blisters that break open, change into scabs, and dry up in about 2-3 weeks. ?At any time during the infection, you may also develop: ?A fever. ?Chills. ?A headache. ?Nausea. ?How is this diagnosed? ?This condition is diagnosed with a skin exam. Skin or fluid samples (a culture) may be taken from the blisters before a diagnosis is made. ?How is this treated? ?The rash may last  for several weeks. There is not a specific cure for this condition. Your health care provider may prescribe medicines to help you manage pain, recover more quickly, and avoid long-term problems. Medicines may include: ?Antiviral medicines. ?Anti-inflammatory medicines. ?Pain medicines. ?Anti-itching medicines (antihistamines). ?If the area involved is on your face, you may be referred to a specialist, such as an eye doctor (ophthalmologist) or an ear, nose, and throat (ENT) doctor (otorhinolaryngologist) to help you avoid eye problems, chronic pain, or disability. ?Follow these instructions at home: ?Medicines ?Take over-the-counter and prescription medicines only as told by your health care provider. ?Apply an anti-itch cream or numbing cream to the affected area as told by your health care provider. ?Relieving itching and discomfort ? ?Apply cold, wet cloths (cold compresses) to the area of the rash or blisters as told by your health care provider. ?Cool baths can be soothing. Try adding baking soda or dry oatmeal to the water to reduce itching. Do not bathe in hot water. ?Use calamine lotion as recommended by your health care provider. This is an over-the-counter lotion that helps to relieve itchiness. ?Blister and rash care ?Keep your rash covered with a loose bandage (dressing). Wear loose-fitting clothing to help ease the pain of material rubbing against the rash. ?Wash your hands with soap and water for at least 20 seconds before and after you change your dressing. If soap and water are not available, use hand sanitizer. ?Change your dressing as told by your health care provider. ?Keep your rash and blisters clean by washing the area with mild soap and cool water as told by your health   care provider. ?Check your rash every day for signs of infection. Check for: ?More redness, swelling, or pain. ?Fluid or blood. ?Warmth. ?Pus or a bad smell. ?Do not scratch your rash or pick at your blisters. To help avoid  scratching: ?Keep your fingernails clean and cut short. ?Wear gloves or mittens while you sleep, if scratching is a problem. ?General instructions ?Rest as told by your health care provider. ?Wash your hands often with soap and water for at least 20 seconds. If soap and water are not available, use hand sanitizer. Doing this lowers your chance of getting a bacterial skin infection. ?Before your blisters change into scabs, your shingles infection can cause chickenpox in people who have never had it or have never been vaccinated against it. To prevent this from happening, avoid contact with other people, especially: ?Babies. ?Pregnant women. ?Children who have eczema. ?Older people who have transplants. ?People who have chronic illnesses, such as cancer or AIDS. ?Keep all follow-up visits. This is important. ?How is this prevented? ?Getting vaccinated is the best way to prevent shingles and protect against shingles complications. If you have not been vaccinated, talk with your health care provider about getting the vaccine. ?Where to find more information ?Centers for Disease Control and Prevention: www.cdc.gov ?Contact a health care provider if: ?Your pain is not relieved with prescribed medicines. ?Your pain does not get better after the rash heals. ?You have any of these signs of infection: ?More redness, swelling, or pain around the rash. ?Fluid or blood coming from the rash. ?Warmth coming from your rash. ?Pus or a bad smell coming from the rash. ?A fever. ?Get help right away if: ?The rash is on your face or nose. ?You have facial pain, pain around your eye area, or loss of feeling on one side of your face. ?You have difficulty seeing. ?You have ear pain or have ringing in your ear. ?You have a loss of taste. ?Your condition gets worse. ?Summary ?Shingles, also known as herpes zoster, is an infection that causes a painful skin rash and fluid-filled blisters. ?This condition is diagnosed with a skin exam. Skin or  fluid samples (a culture) may be taken from the blisters. ?Keep your rash covered with a loose bandage (dressing). Wear loose-fitting clothing to help ease the pain of material rubbing against the rash. ?Before your blisters change into scabs, your shingles infection can cause chickenpox in people who have never had it or have never been vaccinated against it. ?This information is not intended to replace advice given to you by your health care provider. Make sure you discuss any questions you have with your health care provider. ?Document Revised: 12/21/2019 Document Reviewed: 12/21/2019 ?Elsevier Patient Education ? 2022 Elsevier Inc. ? ?

## 2021-02-20 NOTE — Progress Notes (Signed)
Subjective:    Patient ID: Shaun Clarke, male    DOB: 22-Apr-1960, 61 y.o.   MRN: [id number]  Chief Complaint  Patient presents with   Rash    Right side x 3 days been down all weekend in bed    Pt presents to the office today with a painful rash on right rib cage he noticed three days. It reports it is becoming worse and is constant, burning tingling pain of 3-4 out 10.  Rash This is a new problem. The current episode started in the past 7 days. The problem has been gradually worsening since onset. Location: right rib. The rash is characterized by blistering and redness. He was exposed to nothing. Associated symptoms include fatigue. Pertinent negatives include no congestion, eye pain, fever, joint pain, nail changes, rhinorrhea, shortness of breath, sore throat or vomiting. (headache) Treatments tried: motrin. The treatment provided mild relief.     Review of Systems  Constitutional:  Positive for fatigue. Negative for fever.  HENT:  Negative for congestion, rhinorrhea and sore throat.   Eyes:  Negative for pain.  Respiratory:  Negative for shortness of breath.   Gastrointestinal:  Negative for vomiting.  Musculoskeletal:  Negative for joint pain.  Skin:  Positive for rash. Negative for nail changes.  All other systems reviewed and are negative.     Objective:   Physical Exam Vitals reviewed.  Constitutional:      General: He is not in acute distress.    Appearance: He is well-developed.  HENT:     Head: Normocephalic.     Right Ear: Tympanic membrane normal.     Left Ear: Tympanic membrane normal.  Eyes:     General:        Right eye: No discharge.        Left eye: No discharge.     Pupils: Pupils are equal, round, and reactive to light.  Neck:     Thyroid: No thyromegaly.  Cardiovascular:     Rate and Rhythm: Normal rate and regular rhythm.     Heart sounds: Normal heart sounds. No murmur heard. Pulmonary:     Effort: Pulmonary effort is normal. No respiratory  distress.     Breath sounds: Normal breath sounds. No wheezing.  Abdominal:     General: Bowel sounds are normal. There is no distension.     Palpations: Abdomen is soft.     Tenderness: There is no abdominal tenderness.  Musculoskeletal:        General: No tenderness. Normal range of motion.     Cervical back: Normal range of motion and neck supple.  Skin:    General: Skin is warm and dry.     Findings: Rash present. No erythema.          Comments: Erythemas rash with blisters   Neurological:     Mental Status: He is alert and oriented to person, place, and time.     Cranial Nerves: No cranial nerve deficit.     Deep Tendon Reflexes: Reflexes are normal and symmetric.  Psychiatric:        Behavior: Behavior normal.        Thought Content: Thought content normal.        Judgment: Judgment normal.      BP 99/67    Pulse 94    Wt 186 lb (84.4 kg)    BMI 28.28 kg/m      Assessment & Plan:  Shaun Clarke comes in today with  chief complaint of Rash (Right side x 3 days been down all weekend in bed )   Diagnosis and orders addressed:  1. Herpes zoster without complication Do not scratch  Avoid pregnant women and immune compromised Start valtrex TID Motrin as needed  - valACYclovir (VALTREX) 1000 MG tablet; Take 1 tablet (1,000 mg total) by mouth 3 (three) times daily for 10 days.  Dispense: 30 tablet; Refill: 0  Evelina Dun, FNP

## 2021-02-22 ENCOUNTER — Telehealth: Payer: Self-pay | Admitting: *Deleted

## 2021-02-22 NOTE — Telephone Encounter (Signed)
Received vm message from pt's wife stating pt has Shingles. She states pt has some questions about this. TCT patient and spoke to him. He is asking about when he should get his shingles vaccine. Advised that he continues to be in remission from his lymphoma, so he would get vaccinated like anyone else in his situation. Advised he check with his PCP. Pt states his PCP did not know.   Checked CDC guidelines.  CDC states there is no specific guideline for getting vaccine after. having shingles. The main point was to have the rash completely healed before receiving the vaccine. Pt voiced understanding.

## 2021-03-22 ENCOUNTER — Encounter: Payer: Self-pay | Admitting: Hematology and Oncology

## 2021-03-23 ENCOUNTER — Encounter: Payer: Self-pay | Admitting: Hematology and Oncology

## 2021-03-24 ENCOUNTER — Other Ambulatory Visit: Payer: Self-pay

## 2021-03-24 ENCOUNTER — Ambulatory Visit (HOSPITAL_COMMUNITY)
Admission: RE | Admit: 2021-03-24 | Discharge: 2021-03-24 | Disposition: A | Discharge status: Home / Self Care | Payer: BC Managed Care – PPO | Source: Ambulatory Visit | Attending: Hematology and Oncology | Admitting: Hematology and Oncology

## 2021-03-24 DIAGNOSIS — C8330 Diffuse large B-cell lymphoma, unspecified site: Secondary | ICD-10-CM | POA: Diagnosis not present

## 2021-03-24 DIAGNOSIS — R911 Solitary pulmonary nodule: Secondary | ICD-10-CM | POA: Diagnosis not present

## 2021-03-24 DIAGNOSIS — J432 Centrilobular emphysema: Secondary | ICD-10-CM | POA: Diagnosis not present

## 2021-03-24 DIAGNOSIS — J9811 Atelectasis: Secondary | ICD-10-CM | POA: Diagnosis not present

## 2021-03-24 DIAGNOSIS — C8339 Diffuse large B-cell lymphoma, extranodal and solid organ sites: Secondary | ICD-10-CM | POA: Insufficient documentation

## 2021-03-24 DIAGNOSIS — K5730 Diverticulosis of large intestine without perforation or abscess without bleeding: Secondary | ICD-10-CM | POA: Diagnosis not present

## 2021-03-24 MED ORDER — IOHEXOL 300 MG/ML  SOLN
100.0000 mL | Freq: Once | INTRAMUSCULAR | Status: AC | PRN
  Administered 2021-03-24: 100 mL via INTRAVENOUS

## 2021-03-28 ENCOUNTER — Telehealth: Payer: Self-pay

## 2021-03-28 NOTE — Telephone Encounter (Signed)
Patient's wife called requesting results from recent post chemo CT scan taken on 03/26/21. Patient anxious for results and their f/u apt is not until 04/12/21. ? ?Routed to provider. ?

## 2021-04-03 ENCOUNTER — Telehealth: Payer: Self-pay | Admitting: *Deleted

## 2021-04-03 NOTE — Telephone Encounter (Signed)
TCT patient regarding recent CT scan results.   Spoke with him and advised that his show no evidence of recurrent lymphoma. Pt states he understands and is aware of his appt next week with Dr. Lorenso Courier ?

## 2021-04-03 NOTE — Telephone Encounter (Signed)
-----   Message from Orson Slick, MD sent at 04/02/2021 12:25 PM EDT ----- ?Please let Mr. Haydel know that his CT scan showed no concern for recurrent lymphoma. We will see him back as scheduled on 04/12/2021.  ? ?----- Message ----- ?From: Interface, Rad Results In ?Sent: 03/26/2021   4:43 PM EDT ?To: Orson Slick, MD ? ? ?

## 2021-04-12 ENCOUNTER — Inpatient Hospital Stay: Payer: BC Managed Care – PPO | Attending: Hematology and Oncology | Admitting: Hematology and Oncology

## 2021-04-12 ENCOUNTER — Other Ambulatory Visit: Payer: Self-pay

## 2021-04-12 ENCOUNTER — Inpatient Hospital Stay: Payer: BC Managed Care – PPO

## 2021-04-12 VITALS — BP 107/68 | HR 70 | Temp 97.4°F | Resp 18 | Ht 68.0 in | Wt 184.2 lb

## 2021-04-12 DIAGNOSIS — C7951 Secondary malignant neoplasm of bone: Secondary | ICD-10-CM | POA: Insufficient documentation

## 2021-04-12 DIAGNOSIS — C8339 Diffuse large B-cell lymphoma, extranodal and solid organ sites: Secondary | ICD-10-CM

## 2021-04-12 DIAGNOSIS — Z800 Family history of malignant neoplasm of digestive organs: Secondary | ICD-10-CM | POA: Insufficient documentation

## 2021-04-12 DIAGNOSIS — Z87891 Personal history of nicotine dependence: Secondary | ICD-10-CM | POA: Insufficient documentation

## 2021-04-12 DIAGNOSIS — C8330 Diffuse large B-cell lymphoma, unspecified site: Secondary | ICD-10-CM | POA: Insufficient documentation

## 2021-04-12 DIAGNOSIS — C83398 Diffuse large b-cell lymphoma of other extranodal and solid organ sites: Secondary | ICD-10-CM

## 2021-04-12 LAB — CBC WITH DIFFERENTIAL (CANCER CENTER ONLY)
Abs Immature Granulocytes: 0.01 10*3/uL (ref 0.00–0.07)
Basophils Absolute: 0.1 10*3/uL (ref 0.0–0.1)
Basophils Relative: 1 %
Eosinophils Absolute: 0 10*3/uL (ref 0.0–0.5)
Eosinophils Relative: 1 %
HCT: 45.5 % (ref 39.0–52.0)
Hemoglobin: 15.4 g/dL (ref 13.0–17.0)
Immature Granulocytes: 0 %
Lymphocytes Relative: 21 %
Lymphs Abs: 1.1 10*3/uL (ref 0.7–4.0)
MCH: 28.4 pg (ref 26.0–34.0)
MCHC: 33.8 g/dL (ref 30.0–36.0)
MCV: 83.8 fL (ref 80.0–100.0)
Monocytes Absolute: 0.5 10*3/uL (ref 0.1–1.0)
Monocytes Relative: 10 %
Neutro Abs: 3.3 10*3/uL (ref 1.7–7.7)
Neutrophils Relative %: 67 %
Platelet Count: 231 10*3/uL (ref 150–400)
RBC: 5.43 MIL/uL (ref 4.22–5.81)
RDW: 15.2 % (ref 11.5–15.5)
WBC Count: 4.9 10*3/uL (ref 4.0–10.5)
nRBC: 0 % (ref 0.0–0.2)

## 2021-04-12 LAB — CMP (CANCER CENTER ONLY)
ALT: 20 U/L (ref 0–44)
AST: 21 U/L (ref 15–41)
Albumin: 4.3 g/dL (ref 3.5–5.0)
Alkaline Phosphatase: 55 U/L (ref 38–126)
Anion gap: 5 (ref 5–15)
BUN: 15 mg/dL (ref 6–20)
CO2: 24 mmol/L (ref 22–32)
Calcium: 9.2 mg/dL (ref 8.9–10.3)
Chloride: 111 mmol/L (ref 98–111)
Creatinine: 1.14 mg/dL (ref 0.61–1.24)
GFR, Estimated: >60 mL/min (ref >60)
Glucose, Bld: 110 mg/dL — ABNORMAL HIGH (ref 70–99)
Potassium: 3.9 mmol/L (ref 3.5–5.1)
Sodium: 140 mmol/L (ref 135–145)
Total Bilirubin: 0.4 mg/dL (ref 0.3–1.2)
Total Protein: 7 g/dL (ref 6.5–8.1)

## 2021-04-12 LAB — LACTATE DEHYDROGENASE: LDH: 132 U/L (ref 98–192)

## 2021-04-12 NOTE — Progress Notes (Signed)
?[place] ?Telephone:(336) [phone]   [phone] ? ?PROGRESS NOTE ? ?Patient Care Team: ?Dettinger, Fransisca Kaufmann, MD as PCP - General (Family Medicine) ? ?Hematological/Oncological History ?# Diffuse Large B Cell Lymphoma, Staging IV ? 04/07/2020: presented to the ED with MRI findings concerning for extensive osseous metastatic disease including large volume paraspinal and epidural tumor in the mid and lower thoracic spine ? 04/08/2020: establish care with Dr. Lorenso Courier while inpatient. Scheduled for laminectomy/biopsy of metastatic spine lesion.  ? 04/08/2020: laminectomy performed, biopsy results show DLBCL.  ? 04/26/2020: PET CT scan shows widespread skeletal involvement of the malignancy with numerous Deauville 5 score osseous lesions.  Hypermetabolic lymph nodes in the neck, chest, abdomen and pelvis. ?05/04/2020: Cycle 1 Day 1 of R-CHOP.  ?05/23/2020: Cycle 2 Day 1 of R-CHOP ?06/15/2020: Cycle 3 Day 1 of R-CHOP ?07/04/2020: PET CT scan shows a complete metabolic response to therapy with only mild residual Deauville score 2 lymph nodes  ?07/06/2020: Cycle 4 Day 1 of R-CHOP ?07/27/2020: Cycle 5 Day 1 of R-CHOP ?08/17/2020: Cycle 6 Day 1 of R-CHOP ?09/19/2020: post treatment PET CT scan shows no evidence of progressive disease. Deauville 2. ?03/24/2021: CT scan shows no evidence of recurrent disease. ? ?Interval History:  ?Shaun Clarke 61 y.o. 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 The patient's last visit was on 12/22/2020. In the interim since the last visit he has made a steady recovery from chemotherapy. ? ?On exam today Shaun Clarke reports he has been "not bad" in the interim since her last visit.  He did unfortunately have an outbreak of shingles on the right side of his chest which was extraordinarily painful.  He notes that when it scabbed over it was quite miserable.  He notes that he took valacyclovir with relief of his  symptoms.  He does still have some occasional neuropathy but overall it is improved markedly.  Otherwise he has been doing quite well.  His energy levels are improving and his strength has increased.  His appetite is good and he is not having any signs or symptoms concerning for recurrence at this time.  He denies any fevers, chills, sweats, nausea, vomiting or diarrhea.  A full 10 point ROS is listed below. ? ?MEDICAL HISTORY:  ?Past Medical History:  ?Diagnosis Date  ? GERD (gastroesophageal reflux disease)   ? ? ?SURGICAL HISTORY: ?Past Surgical History:  ?Procedure Laterality Date  ? APPENDECTOMY    ? DECOMPRESSIVE LUMBAR LAMINECTOMY LEVEL 2 N/A 04/08/2020  ? Procedure: THORACIC FIVE-THORACIC SEVEN OPEN LAMINECTOMY FOR RESECTION OF EPIDURAL TUMOR ;  Surgeon: Karsten Ro, DO;  Location: [facility];  Service: Neurosurgery;  Laterality: N/A;  ? IR IMAGING GUIDED PORT INSERTION  04/29/2020  ? IR REMOVAL TUN ACCESS W/ PORT W/O FL MOD SED  10/24/2020  ? KNEE ARTHROSCOPY    ? right knee 3 times  ? SHOULDER BIOPSY    ? right shoulder lipoma removal  ? ? ?SOCIAL HISTORY: ?Social History  ? ?Socioeconomic History  ? Marital status: Married  ?  Spouse name: Not on file  ? Number of children: 2  ? Years of education: Not on file  ? Highest education level: Not on file  ?Occupational History  ? Not on file  ?Tobacco Use  ? Smoking status: Former  ?  Packs/day: 1.00  ?  Types: Cigarettes  ? Smokeless tobacco: Never  ?Vaping Use  ? Vaping Use: Never  used  ?Substance and Sexual Activity  ? Alcohol use: Yes  ?  Alcohol/week: 3.0 standard drinks  ?  Types: 1 Glasses of wine, 2 Cans of beer per week  ?  Comment: per week  ? Drug use: Never  ? Sexual activity: Yes  ?  Partners: Female  ?Other Topics Concern  ? Not on file  ?Social History Narrative  ? Not on file  ? ?Social Determinants of Health  ? ?Financial Resource Strain: Not on file  ?Food Insecurity: Not on file  ?Transportation Needs: Not on file  ?Physical Activity: Not on file   ?Stress: Not on file  ?Social Connections: Not on file  ?Intimate Partner Violence: Not on file  ? ? ?FAMILY HISTORY: ?Family History  ?Problem Relation Age of Onset  ? Pancreatic cancer Mother   ? Diabetes Father   ? Anxiety disorder Sister   ? Multiple sclerosis Sister   ? COPD Maternal Grandfather   ? ? ?ALLERGIES:  is allergic to naproxen and gadolinium derivatives. ? ?MEDICATIONS:  ?Current Outpatient Medications  ?Medication Sig Dispense Refill  ? methocarbamol (ROBAXIN) 500 MG tablet Take 1 tablet (500 mg total) by mouth every 6 (six) hours as needed for muscle spasms. 30 tablet 0  ? ?No current facility-administered medications for this visit.  ? ? ?REVIEW OF SYSTEMS:   ?Constitutional: ( - ) fevers, ( - )  chills , ( - ) night sweats ?Eyes: ( - ) blurriness of vision, ( - ) double vision, ( - ) watery eyes ?Ears, nose, mouth, throat, and face: ( - ) mucositis, ( - ) sore throat ?Respiratory: ( - ) cough, ( - ) dyspnea, ( - ) wheezes ?Cardiovascular: ( - ) palpitation, ( - ) chest discomfort, ( - ) lower extremity swelling ?Gastrointestinal:  ( - ) nausea, ( - ) heartburn, ( - ) change in bowel habits ?Skin: ( - ) abnormal skin rashes ?Lymphatics: ( - ) new lymphadenopathy, ( - ) easy bruising ?Neurological: ( - ) numbness, ( - ) tingling, ( - ) new weaknesses ?Behavioral/Psych: ( - ) mood change, ( - ) new changes  ?All other systems were reviewed with the patient and are negative. ? ?PHYSICAL EXAMINATION: ?ECOG PERFORMANCE STATUS: 1 - Symptomatic but completely ambulatory ? ?Vitals:  ? 04/12/21 1453  ?BP: 107/68  ?Pulse: 70  ?Resp: 18  ?Temp: (!) 97.4 ?F (36.3 ?C)  ?SpO2: 98%  ? ? ? ?Filed Weights  ? 04/12/21 1453  ?Weight: 184 lb 3.2 oz (83.6 kg)  ? ?GENERAL: well appearing middle aged Caucasian male alert, no distress and comfortable ?SKIN: skin color, texture, turgor are normal, no rashes or significant lesions ?EYES: conjunctiva are pink and non-injected, sclera clear ?LUNGS: clear to auscultation and  percussion with normal breathing effort ?HEART: regular rate & rhythm and no murmurs and no lower extremity edema ?Musculoskeletal: no cyanosis of digits and no clubbing  ?PSYCH: alert & oriented x 3, fluent speech ?NEURO: no focal motor/sensory deficits ? ?LABORATORY DATA:  ?I have reviewed the data as listed ? ?  Latest Ref Rng & Units 04/12/2021  ?  2:28 PM 12/22/2020  ?  8:24 AM 08/17/2020  ? 10:16 AM  ?CBC  ?WBC 4.0 - 10.5 K/uL 4.9   3.6   5.0    ?Hemoglobin 13.0 - 17.0 g/dL 15.4   15.0   12.8    ?Hematocrit 39.0 - 52.0 % 45.5   44.4   38.8    ?Platelets  150 - 400 K/uL 231   223   336    ? ? ? ?  Latest Ref Rng & Units 04/12/2021  ?  2:28 PM 12/22/2020  ?  8:24 AM 08/17/2020  ? 10:16 AM  ?CMP  ?Glucose 70 - 99 mg/dL 110   91   120    ?BUN 6 - 20 mg/dL '15   14   12    '$ ?Creatinine 0.61 - 1.24 mg/dL 1.14   1.06   0.92    ?Sodium 135 - 145 mmol/L 140   143   142    ?Potassium 3.5 - 5.1 mmol/L 3.9   4.9   3.9    ?Chloride 98 - 111 mmol/L 111   111   108    ?CO2 22 - 32 mmol/L '24   23   23    '$ ?Calcium 8.9 - 10.3 mg/dL 9.2   9.2   9.5    ?Total Protein 6.5 - 8.1 g/dL 7.0   6.6   6.2    ?Total Bilirubin 0.3 - 1.2 mg/dL 0.4   0.6   0.4    ?Alkaline Phos 38 - 126 U/L 55   67   70    ?AST 15 - 41 U/L '21   19   16    '$ ?ALT 0 - 44 U/L '20   17   18    '$ ? ? ?Lab Results  ?Component Value Date  ? MPROTEIN Not Observed 04/07/2020  ? ?Lab Results  ?Component Value Date  ? KPAFRELGTCHN 11.4 04/07/2020  ? LAMBDASER 9.0 04/07/2020  ? KAPLAMBRATIO 1.27 04/07/2020  ? ? ? ?RADIOGRAPHIC STUDIES: ?CT CHEST ABDOMEN PELVIS W CONTRAST ? ?Result Date: 03/26/2021 ?CLINICAL DATA:  Diffuse large B-cell lymphoma.  Restaging. EXAM: CT CHEST, ABDOMEN, AND PELVIS WITH CONTRAST TECHNIQUE: Multidetector CT imaging of the chest, abdomen and pelvis was performed following the standard protocol during bolus administration of intravenous contrast. RADIATION DOSE REDUCTION: This exam was performed according to the departmental dose-optimization program which  includes automated exposure control, adjustment of the mA and/or kV according to patient size and/or use of iterative reconstruction technique. CONTRAST:  129m OMNIPAQUE IOHEXOL 300 MG/ML  SOLN COMPARISON:

## 2021-04-13 ENCOUNTER — Encounter: Payer: Self-pay | Admitting: Hematology and Oncology

## 2021-04-13 ENCOUNTER — Telehealth: Payer: Self-pay | Admitting: Hematology and Oncology

## 2021-04-13 NOTE — Telephone Encounter (Signed)
Scheduled per 4/5 los, pt has been called and confirmed  ?

## 2021-07-14 ENCOUNTER — Other Ambulatory Visit: Payer: BC Managed Care – PPO

## 2021-07-14 ENCOUNTER — Ambulatory Visit: Payer: BC Managed Care – PPO | Admitting: Hematology and Oncology

## 2021-07-18 ENCOUNTER — Inpatient Hospital Stay (HOSPITAL_BASED_OUTPATIENT_CLINIC_OR_DEPARTMENT_OTHER): Payer: BC Managed Care – PPO | Admitting: Hematology and Oncology

## 2021-07-18 ENCOUNTER — Other Ambulatory Visit: Payer: Self-pay

## 2021-07-18 ENCOUNTER — Other Ambulatory Visit: Payer: Self-pay | Admitting: Hematology and Oncology

## 2021-07-18 ENCOUNTER — Inpatient Hospital Stay: Payer: BC Managed Care – PPO | Attending: Hematology and Oncology

## 2021-07-18 VITALS — BP 104/75 | HR 58 | Temp 97.4°F | Resp 16 | Wt 181.9 lb

## 2021-07-18 DIAGNOSIS — C8339 Diffuse large B-cell lymphoma, extranodal and solid organ sites: Secondary | ICD-10-CM

## 2021-07-18 DIAGNOSIS — Z800 Family history of malignant neoplasm of digestive organs: Secondary | ICD-10-CM | POA: Insufficient documentation

## 2021-07-18 DIAGNOSIS — C7951 Secondary malignant neoplasm of bone: Secondary | ICD-10-CM | POA: Diagnosis not present

## 2021-07-18 DIAGNOSIS — C8338 Diffuse large B-cell lymphoma, lymph nodes of multiple sites: Secondary | ICD-10-CM | POA: Insufficient documentation

## 2021-07-18 DIAGNOSIS — Z87891 Personal history of nicotine dependence: Secondary | ICD-10-CM | POA: Diagnosis not present

## 2021-07-18 LAB — CMP (CANCER CENTER ONLY)
ALT: 16 U/L (ref 0–44)
AST: 17 U/L (ref 15–41)
Albumin: 4.3 g/dL (ref 3.5–5.0)
Alkaline Phosphatase: 54 U/L (ref 38–126)
Anion gap: 5 (ref 5–15)
BUN: 16 mg/dL (ref 6–20)
CO2: 27 mmol/L (ref 22–32)
Calcium: 9.4 mg/dL (ref 8.9–10.3)
Chloride: 108 mmol/L (ref 98–111)
Creatinine: 1.13 mg/dL (ref 0.61–1.24)
GFR, Estimated: >60 mL/min (ref >60)
Glucose, Bld: 95 mg/dL (ref 70–99)
Potassium: 4.8 mmol/L (ref 3.5–5.1)
Sodium: 140 mmol/L (ref 135–145)
Total Bilirubin: 0.7 mg/dL (ref 0.3–1.2)
Total Protein: 6.7 g/dL (ref 6.5–8.1)

## 2021-07-18 LAB — CBC WITH DIFFERENTIAL (CANCER CENTER ONLY)
Abs Immature Granulocytes: 0.01 10*3/uL (ref 0.00–0.07)
Basophils Absolute: 0.1 10*3/uL (ref 0.0–0.1)
Basophils Relative: 1 %
Eosinophils Absolute: 0.1 10*3/uL (ref 0.0–0.5)
Eosinophils Relative: 3 %
HCT: 45.1 % (ref 39.0–52.0)
Hemoglobin: 15.4 g/dL (ref 13.0–17.0)
Immature Granulocytes: 0 %
Lymphocytes Relative: 21 %
Lymphs Abs: 0.8 10*3/uL (ref 0.7–4.0)
MCH: 29.2 pg (ref 26.0–34.0)
MCHC: 34.1 g/dL (ref 30.0–36.0)
MCV: 85.4 fL (ref 80.0–100.0)
Monocytes Absolute: 0.4 10*3/uL (ref 0.1–1.0)
Monocytes Relative: 11 %
Neutro Abs: 2.4 10*3/uL (ref 1.7–7.7)
Neutrophils Relative %: 64 %
Platelet Count: 220 10*3/uL (ref 150–400)
RBC: 5.28 MIL/uL (ref 4.22–5.81)
RDW: 14.1 % (ref 11.5–15.5)
WBC Count: 3.8 10*3/uL — ABNORMAL LOW (ref 4.0–10.5)
nRBC: 0 % (ref 0.0–0.2)

## 2021-07-18 LAB — LACTATE DEHYDROGENASE: LDH: 169 U/L (ref 98–192)

## 2021-07-18 NOTE — Progress Notes (Signed)
[place] Telephone:(336) [phone]   Fax:(336) [phone]  PROGRESS NOTE  Patient Care Team: Dettinger, Fransisca Kaufmann, MD as PCP - General (Family Medicine)  Hematological/Oncological History # Diffuse Large B Cell Lymphoma, Staging IV  04/07/2020: presented to the ED with MRI findings concerning for extensive osseous metastatic disease including large volume paraspinal and epidural tumor in the mid and lower thoracic spine  04/08/2020: establish care with Dr. Lorenso Courier while inpatient. Scheduled for laminectomy/biopsy of metastatic spine lesion.   04/08/2020: laminectomy performed, biopsy results show DLBCL.   04/26/2020: PET CT scan shows widespread skeletal involvement of the malignancy with numerous Deauville 5 score osseous lesions.  Hypermetabolic lymph nodes in the neck, chest, abdomen and pelvis. 05/04/2020: Cycle 1 Day 1 of R-CHOP.  05/23/2020: Cycle 2 Day 1 of R-CHOP 06/15/2020: Cycle 3 Day 1 of R-CHOP 07/04/2020: PET CT scan shows a complete metabolic response to therapy with only mild residual Deauville score 2 lymph nodes  07/06/2020: Cycle 4 Day 1 of R-CHOP 07/27/2020: Cycle 5 Day 1 of R-CHOP 08/17/2020: Cycle 6 Day 1 of R-CHOP 09/19/2020: post treatment PET CT scan shows no evidence of progressive disease. Deauville 2. 03/24/2021: CT scan shows no evidence of recurrent disease.  Interval History:  Shaun Clarke 61 y.o. 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 The patient's last visit was on 12/22/2020. In the interim since the last visit he has made a steady recovery from chemotherapy.  On exam today Shaun Clarke reports he has had no major changes in his health in the interim since her last visit.  He is not taking any new medications.  He reports his spine continues to heal well and he feels like he is back to about 90%.  His appetite has been good and his weight has remained stable.  He reports that his goal  weight is approximately 166 pounds.  He notes that his energy has been quite good.  He does still have some occasional pain in his bones particularly collarbones and hips.  He reports that it is "resolving but present".  He notes that he also does have some residual numbness across his face which he notes began with his shingles diagnosis.  Otherwise he has been "feeling really good".  He denies any fevers, chills, sweats, nausea, vomiting or diarrhea.  A full 10 point ROS is listed below.  MEDICAL HISTORY:  Past Medical History:  Diagnosis Date   GERD (gastroesophageal reflux disease)     SURGICAL HISTORY: Past Surgical History:  Procedure Laterality Date   APPENDECTOMY     DECOMPRESSIVE LUMBAR LAMINECTOMY LEVEL 2 N/A 04/08/2020   Procedure: THORACIC FIVE-THORACIC SEVEN OPEN LAMINECTOMY FOR RESECTION OF EPIDURAL TUMOR ;  Surgeon: Karsten Ro, DO;  Location: [facility];  Service: Neurosurgery;  Laterality: N/A;   IR IMAGING GUIDED PORT INSERTION  04/29/2020   IR REMOVAL TUN ACCESS W/ PORT W/O FL MOD SED  10/24/2020   KNEE ARTHROSCOPY     right knee 3 times   SHOULDER BIOPSY     right shoulder lipoma removal    SOCIAL HISTORY: Social History   Socioeconomic History   Marital status: Married    Spouse name: Not on file   Number of children: 2   Years of education: Not on file   Highest education level: Not on file  Occupational History   Not on file  Tobacco Use   Smoking status: Former  Packs/day: 1.00    Types: Cigarettes   Smokeless tobacco: Never  Vaping Use   Vaping Use: Never used  Substance and Sexual Activity   Alcohol use: Yes    Alcohol/week: 3.0 standard drinks of alcohol    Types: 1 Glasses of wine, 2 Cans of beer per week    Comment: per week   Drug use: Never   Sexual activity: Yes    Partners: Female  Other Topics Concern   Not on file  Social History Narrative   Not on file   Social Determinants of Health   Financial Resource Strain: Not on file   Food Insecurity: Not on file  Transportation Needs: Not on file  Physical Activity: Not on file  Stress: Not on file  Social Connections: Not on file  Intimate Partner Violence: Not on file    FAMILY HISTORY: Family History  Problem Relation Age of Onset   Pancreatic cancer Mother    Diabetes Father    Anxiety disorder Sister    Multiple sclerosis Sister    COPD Maternal Grandfather     ALLERGIES:  is allergic to naproxen and gadolinium derivatives.  MEDICATIONS:  Current Outpatient Medications  Medication Sig Dispense Refill   methocarbamol (ROBAXIN) 500 MG tablet Take 1 tablet (500 mg total) by mouth every 6 (six) hours as needed for muscle spasms. 30 tablet 0   No current facility-administered medications for this visit.    REVIEW OF SYSTEMS:   Constitutional: ( - ) fevers, ( - )  chills , ( - ) night sweats Eyes: ( - ) blurriness of vision, ( - ) double vision, ( - ) watery eyes Ears, nose, mouth, throat, and face: ( - ) mucositis, ( - ) sore throat Respiratory: ( - ) cough, ( - ) dyspnea, ( - ) wheezes Cardiovascular: ( - ) palpitation, ( - ) chest discomfort, ( - ) lower extremity swelling Gastrointestinal:  ( - ) nausea, ( - ) heartburn, ( - ) change in bowel habits Skin: ( - ) abnormal skin rashes Lymphatics: ( - ) new lymphadenopathy, ( - ) easy bruising Neurological: ( - ) numbness, ( - ) tingling, ( - ) new weaknesses Behavioral/Psych: ( - ) mood change, ( - ) new changes  All other systems were reviewed with the patient and are negative.  PHYSICAL EXAMINATION: ECOG PERFORMANCE STATUS: 1 - Symptomatic but completely ambulatory  Vitals:   07/18/21 1010  BP: 104/75  Pulse: (!) 58  Resp: 16  Temp: (!) 97.4 F (36.3 C)  SpO2: 98%     Filed Weights   07/18/21 1010  Weight: 181 lb 14.4 oz (82.5 kg)   GENERAL: well appearing middle aged Caucasian male alert, no distress and comfortable SKIN: skin color, texture, turgor are normal, no rashes or  significant lesions LYMPH: No enlarged cervical or supraclavicular lymph nodes. EYES: conjunctiva are pink and non-injected, sclera clear LUNGS: clear to auscultation and percussion with normal breathing effort HEART: regular rate & rhythm and no murmurs and no lower extremity edema Musculoskeletal: no cyanosis of digits and no clubbing  PSYCH: alert & oriented x 3, fluent speech NEURO: no focal motor/sensory deficits  LABORATORY DATA:  I have reviewed the data as listed    Latest Ref Rng & Units 07/18/2021   10:01 AM 04/12/2021    2:28 PM 12/22/2020    8:24 AM  CBC  WBC 4.0 - 10.5 K/uL 3.8  4.9  3.6   Hemoglobin 13.0 -  17.0 g/dL 15.4  15.4  15.0   Hematocrit 39.0 - 52.0 % 45.1  45.5  44.4   Platelets 150 - 400 K/uL 220  231  223        Latest Ref Rng & Units 07/18/2021   10:01 AM 04/12/2021    2:28 PM 12/22/2020    8:24 AM  CMP  Glucose 70 - 99 mg/dL 95  110  91   BUN 6 - 20 mg/dL '16  15  14   '$ Creatinine 0.61 - 1.24 mg/dL 1.13  1.14  1.06   Sodium 135 - 145 mmol/L 140  140  143   Potassium 3.5 - 5.1 mmol/L 4.8  3.9  4.9   Chloride 98 - 111 mmol/L 108  111  111   CO2 22 - 32 mmol/L '27  24  23   '$ Calcium 8.9 - 10.3 mg/dL 9.4  9.2  9.2   Total Protein 6.5 - 8.1 g/dL 6.7  7.0  6.6   Total Bilirubin 0.3 - 1.2 mg/dL 0.7  0.4  0.6   Alkaline Phos 38 - 126 U/L 54  55  67   AST 15 - 41 U/L '17  21  19   '$ ALT 0 - 44 U/L '16  20  17     '$ Lab Results  Component Value Date   MPROTEIN Not Observed 04/07/2020   Lab Results  Component Value Date   KPAFRELGTCHN 11.4 04/07/2020   LAMBDASER 9.0 04/07/2020   KAPLAMBRATIO 1.27 04/07/2020     RADIOGRAPHIC STUDIES: No results found.  ASSESSMENT & PLAN Shaun Clarke 61 y.o. 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  After review the labs, the records, schedule the patient the findings most consistent with a diffuse large B-cell lymphoma with staging in process.  We  completed the staging with a PET CT scan which showed his disease does appear as higher stage (III or IV). At this time we will plan for at least 6 cycles of R-CHOP followed by repeat scans.    Previously we discussed the risks and benefits of R-CHOP chemotherapy.  We noted the nature of diffuse large B-cell lymphoma and the possibility of achieving a complete remission with this chemotherapy regimen.  I also noted the side effects which include but are not limited to nausea, vomiting, hair loss, diarrhea, sedation, insomnia, hyperglycemia, high blood pressure, and cardiotoxicity.  The patient voices understanding of the risk and benefits and was agreeable to proceeding with treatment at this time.   The R-CHOP regimen consists of rituximab 375 mg per metered squared IV on day 1, cyclophosphamide 750 mg per metered squared on day 1, doxorubicin 50 mg/m on day 1, vincristine 1.4 mg per metered squared with a max dose of 2 mg on day 1, and prednisone 100 mg orally days 1 through 5.  A cycle lasts for 21 days and number of cycles planned will depend on the stage as determined by the PET CT scan.  IPI Prognostic Index: 2 points Good Prognosis (80% progression free survival)  # Diffuse Large B Cell Lymphoma, Stage IV --Findings are most consistent with a diffuse large B-cell lymphoma metastatic to the spine.  Given his metastatic disease in the spine he is at least a stage III or IV. -- PET CT scan on 07/04/2020 showed complete metabolic response.  Similar results on posttreatment PET CT scan in September 2020 --FISH testing negative for double  HIT, we proceeded with R-CHOP chemotherapy as noted above.  --Cycle 1 Day 1 started on 05/04/2020 --Post treatment PET CT scan on 09/08/2020 showed no evidence of disease with Deauville 2 Plan: --labs today show white blood cell count 3.8, hemoglobin 15.4, and platelets of 220. --Plan for patient return to clinic in 12 weeks with CT scan (due Sept 2023)    #Supportive Care --chemotherapy education completed --port in place --Echocardiogram performed, adequate heart function to proceed. --zofran '8mg'$  q8H PRN and compazine '10mg'$  PO q6H for nausea -- OK to d/c allopurinol  -- EMLA cream for port -- no pain medication required at this time.   No orders of the defined types were placed in this encounter.  All questions were answered. The patient knows to call the clinic with any problems, questions or concerns.  A total of more than 30 minutes were spent on this encounter and over half of that time was spent on counseling and coordination of care as outlined above.   Ledell Peoples, MD Department of Hematology/Oncology [facility] [facility] Phone: [phone] Pager: [phone] Email: [email or web address]  07/18/2021 11:59 AM

## 2021-07-19 ENCOUNTER — Telehealth: Payer: Self-pay | Admitting: Hematology and Oncology

## 2021-07-19 NOTE — Telephone Encounter (Signed)
Scheduled per 7/11 los, message has been left

## 2021-07-31 ENCOUNTER — Other Ambulatory Visit: Payer: Self-pay

## 2021-08-13 ENCOUNTER — Other Ambulatory Visit: Payer: Self-pay

## 2021-10-13 ENCOUNTER — Ambulatory Visit (HOSPITAL_COMMUNITY)
Admission: RE | Admit: 2021-10-13 | Discharge: 2021-10-13 | Disposition: A | Discharge status: Home / Self Care | Payer: BC Managed Care – PPO | Source: Ambulatory Visit | Attending: Hematology and Oncology | Admitting: Hematology and Oncology

## 2021-10-13 ENCOUNTER — Encounter (HOSPITAL_COMMUNITY): Payer: Self-pay

## 2021-10-13 DIAGNOSIS — C8339 Diffuse large B-cell lymphoma, extranodal and solid organ sites: Secondary | ICD-10-CM | POA: Insufficient documentation

## 2021-10-13 DIAGNOSIS — I700 Atherosclerosis of aorta: Secondary | ICD-10-CM | POA: Diagnosis not present

## 2021-10-13 DIAGNOSIS — K4090 Unilateral inguinal hernia, without obstruction or gangrene, not specified as recurrent: Secondary | ICD-10-CM | POA: Diagnosis not present

## 2021-10-13 DIAGNOSIS — J9811 Atelectasis: Secondary | ICD-10-CM | POA: Diagnosis not present

## 2021-10-13 DIAGNOSIS — K5730 Diverticulosis of large intestine without perforation or abscess without bleeding: Secondary | ICD-10-CM | POA: Diagnosis not present

## 2021-10-13 DIAGNOSIS — C83398 Diffuse large b-cell lymphoma of other extranodal and solid organ sites: Secondary | ICD-10-CM

## 2021-10-13 DIAGNOSIS — Z8572 Personal history of non-Hodgkin lymphomas: Secondary | ICD-10-CM | POA: Diagnosis not present

## 2021-10-13 DIAGNOSIS — J432 Centrilobular emphysema: Secondary | ICD-10-CM | POA: Diagnosis not present

## 2021-10-13 DIAGNOSIS — M533 Sacrococcygeal disorders, not elsewhere classified: Secondary | ICD-10-CM | POA: Diagnosis not present

## 2021-10-13 MED ORDER — SODIUM CHLORIDE (PF) 0.9 % IJ SOLN
INTRAMUSCULAR | Status: AC
  Filled 2021-10-13: qty 50

## 2021-10-13 MED ORDER — IOHEXOL 300 MG/ML  SOLN
100.0000 mL | Freq: Once | INTRAMUSCULAR | Status: AC | PRN
  Administered 2021-10-13: 100 mL via INTRAVENOUS

## 2021-10-18 ENCOUNTER — Inpatient Hospital Stay: Payer: BC Managed Care – PPO | Admitting: Hematology and Oncology

## 2021-10-18 ENCOUNTER — Inpatient Hospital Stay: Payer: BC Managed Care – PPO | Attending: Hematology and Oncology

## 2021-10-18 ENCOUNTER — Other Ambulatory Visit: Payer: Self-pay

## 2021-10-18 ENCOUNTER — Other Ambulatory Visit: Payer: Self-pay | Admitting: Hematology and Oncology

## 2021-10-18 VITALS — BP 113/77 | HR 62 | Temp 98.6°F | Resp 17 | Ht 68.0 in | Wt 184.7 lb

## 2021-10-18 DIAGNOSIS — C8338 Diffuse large B-cell lymphoma, lymph nodes of multiple sites: Secondary | ICD-10-CM | POA: Diagnosis not present

## 2021-10-18 DIAGNOSIS — C7951 Secondary malignant neoplasm of bone: Secondary | ICD-10-CM | POA: Diagnosis not present

## 2021-10-18 DIAGNOSIS — C8339 Diffuse large B-cell lymphoma, extranodal and solid organ sites: Secondary | ICD-10-CM

## 2021-10-18 DIAGNOSIS — Z800 Family history of malignant neoplasm of digestive organs: Secondary | ICD-10-CM | POA: Diagnosis not present

## 2021-10-18 DIAGNOSIS — Z87891 Personal history of nicotine dependence: Secondary | ICD-10-CM | POA: Diagnosis not present

## 2021-10-18 LAB — CMP (CANCER CENTER ONLY)
ALT: 18 U/L (ref 0–44)
AST: 16 U/L (ref 15–41)
Albumin: 4.3 g/dL (ref 3.5–5.0)
Alkaline Phosphatase: 62 U/L (ref 38–126)
Anion gap: 6 (ref 5–15)
BUN: 15 mg/dL (ref 6–20)
CO2: 28 mmol/L (ref 22–32)
Calcium: 9.3 mg/dL (ref 8.9–10.3)
Chloride: 107 mmol/L (ref 98–111)
Creatinine: 1.16 mg/dL (ref 0.61–1.24)
GFR, Estimated: >60 mL/min (ref >60)
Glucose, Bld: 77 mg/dL (ref 70–99)
Potassium: 4.4 mmol/L (ref 3.5–5.1)
Sodium: 141 mmol/L (ref 135–145)
Total Bilirubin: 0.6 mg/dL (ref 0.3–1.2)
Total Protein: 7 g/dL (ref 6.5–8.1)

## 2021-10-18 LAB — CBC WITH DIFFERENTIAL (CANCER CENTER ONLY)
Abs Immature Granulocytes: 0.01 10*3/uL (ref 0.00–0.07)
Basophils Absolute: 0.1 10*3/uL (ref 0.0–0.1)
Basophils Relative: 2 %
Eosinophils Absolute: 0.2 10*3/uL (ref 0.0–0.5)
Eosinophils Relative: 4 %
HCT: 46.4 % (ref 39.0–52.0)
Hemoglobin: 15.8 g/dL (ref 13.0–17.0)
Immature Granulocytes: 0 %
Lymphocytes Relative: 20 %
Lymphs Abs: 0.8 10*3/uL (ref 0.7–4.0)
MCH: 28.9 pg (ref 26.0–34.0)
MCHC: 34.1 g/dL (ref 30.0–36.0)
MCV: 84.8 fL (ref 80.0–100.0)
Monocytes Absolute: 0.4 10*3/uL (ref 0.1–1.0)
Monocytes Relative: 11 %
Neutro Abs: 2.5 10*3/uL (ref 1.7–7.7)
Neutrophils Relative %: 63 %
Platelet Count: 248 10*3/uL (ref 150–400)
RBC: 5.47 MIL/uL (ref 4.22–5.81)
RDW: 14.4 % (ref 11.5–15.5)
WBC Count: 3.9 10*3/uL — ABNORMAL LOW (ref 4.0–10.5)
nRBC: 0 % (ref 0.0–0.2)

## 2021-10-18 LAB — LACTATE DEHYDROGENASE: LDH: 137 U/L (ref 98–192)

## 2021-10-21 ENCOUNTER — Encounter: Payer: Self-pay | Admitting: Hematology and Oncology

## 2021-10-21 NOTE — Progress Notes (Signed)
[place] [phone]   [phone]  PROGRESS NOTE  Patient Care Team: Dettinger, Fransisca Kaufmann, MD as PCP - General (Family Medicine)  Hematological/Oncological History # Diffuse Large B Cell Lymphoma, Staging IV  04/07/2020: presented to the ED with MRI findings concerning for extensive osseous metastatic disease including large volume paraspinal and epidural tumor in the mid and lower thoracic spine  04/08/2020: establish care with Dr. Lorenso Courier while inpatient. Scheduled for laminectomy/biopsy of metastatic spine lesion.   04/08/2020: laminectomy performed, biopsy results show DLBCL.   04/26/2020: PET CT scan shows widespread skeletal involvement of the malignancy with numerous Deauville 5 score osseous lesions.  Hypermetabolic lymph nodes in the neck, chest, abdomen and pelvis. 05/04/2020: Cycle 1 Day 1 of R-CHOP.  05/23/2020: Cycle 2 Day 1 of R-CHOP 06/15/2020: Cycle 3 Day 1 of R-CHOP 07/04/2020: PET CT scan shows a complete metabolic response to therapy with only mild residual Deauville score 2 lymph nodes  07/06/2020: Cycle 4 Day 1 of R-CHOP 07/27/2020: Cycle 5 Day 1 of R-CHOP 08/17/2020: Cycle 6 Day 1 of R-CHOP 09/19/2020: post treatment PET CT scan shows no evidence of progressive disease. Deauville 2. 03/24/2021: CT scan shows no evidence of recurrent disease. 10/13/2021: CT scan shows no evidence of recurrent disease.  Interval History:  Shaun Clarke 61 y.o. 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 The patient's last visit was on 07/18/2021. In the interim since the last visit he has made a steady recovery from chemotherapy.  On exam today Shaun Clarke reports he is in good shape and feeling "too good" in the interim since her last visit 3 months ago.  He reports he did get a cough approximately 1 month ago which has subsequently persisted.  He has a dry cough that is provoked by a "tickle in  the back of my throat".  He thinks this may be related to allergies.  He is having a "little bit of heartburn as well".  He notes that he took some antacids and does have a prescription for Protonix which she has not filled or taken recently.  He did have a great deal of anxiety about today's CT scan.  Overall he is at his baseline level of health and not having any major changes.  He notes his memory does still remain foggy sometimes during energy right now is an 8 out of 10 with some occasional bouts of fatigue.  He denies any fevers, chills, sweats, nausea, vomiting or diarrhea.  A full 10 point ROS is listed below.  MEDICAL HISTORY:  Past Medical History:  Diagnosis Date   GERD (gastroesophageal reflux disease)     SURGICAL HISTORY: Past Surgical History:  Procedure Laterality Date   APPENDECTOMY     DECOMPRESSIVE LUMBAR LAMINECTOMY LEVEL 2 N/A 04/08/2020   Procedure: THORACIC FIVE-THORACIC SEVEN OPEN LAMINECTOMY FOR RESECTION OF EPIDURAL TUMOR ;  Surgeon: Karsten Ro, DO;  Location: [facility];  Service: Neurosurgery;  Laterality: N/A;   IR IMAGING GUIDED PORT INSERTION  04/29/2020   IR REMOVAL TUN ACCESS W/ PORT W/O FL MOD SED  10/24/2020   KNEE ARTHROSCOPY     right knee 3 times   SHOULDER BIOPSY     right shoulder lipoma removal    SOCIAL HISTORY: Social History   Socioeconomic History   Marital status: Married    Spouse name: Not on file   Number of children: 2   Years of  education: Not on file   Highest education level: Not on file  Occupational History   Not on file  Tobacco Use   Smoking status: Former    Packs/day: 1.00    Types: Cigarettes   Smokeless tobacco: Never  Vaping Use   Vaping Use: Never used  Substance and Sexual Activity   Alcohol use: Yes    Alcohol/week: 3.0 standard drinks of alcohol    Types: 1 Glasses of wine, 2 Cans of beer per week    Comment: per week   Drug use: Never   Sexual activity: Yes    Partners: Female  Other Topics Concern   Not  on file  Social History Narrative   Not on file   Social Determinants of Health   Financial Resource Strain: Not on file  Food Insecurity: Not on file  Transportation Needs: Not on file  Physical Activity: Not on file  Stress: Not on file  Social Connections: Not on file  Intimate Partner Violence: Not on file    FAMILY HISTORY: Family History  Problem Relation Age of Onset   Pancreatic cancer Mother    Diabetes Father    Anxiety disorder Sister    Multiple sclerosis Sister    COPD Maternal Grandfather     ALLERGIES:  is allergic to naproxen and gadolinium derivatives.  MEDICATIONS:  Current Outpatient Medications  Medication Sig Dispense Refill   methocarbamol (ROBAXIN) 500 MG tablet Take 1 tablet (500 mg total) by mouth every 6 (six) hours as needed for muscle spasms. 30 tablet 0   No current facility-administered medications for this visit.    REVIEW OF SYSTEMS:   Constitutional: ( - ) fevers, ( - )  chills , ( - ) night sweats Eyes: ( - ) blurriness of vision, ( - ) double vision, ( - ) watery eyes Ears, nose, mouth, throat, and face: ( - ) mucositis, ( - ) sore throat Respiratory: ( - ) cough, ( - ) dyspnea, ( - ) wheezes Cardiovascular: ( - ) palpitation, ( - ) chest discomfort, ( - ) lower extremity swelling Gastrointestinal:  ( - ) nausea, ( - ) heartburn, ( - ) change in bowel habits Skin: ( - ) abnormal skin rashes Lymphatics: ( - ) new lymphadenopathy, ( - ) easy bruising Neurological: ( - ) numbness, ( - ) tingling, ( - ) new weaknesses Behavioral/Psych: ( - ) mood change, ( - ) new changes  All other systems were reviewed with the patient and are negative.  PHYSICAL EXAMINATION: ECOG PERFORMANCE STATUS: 1 - Symptomatic but completely ambulatory  Vitals:   10/18/21 1014  BP: 113/77  Pulse: 62  Resp: 17  Temp: 98.6 F (37 C)  SpO2: 95%     Filed Weights   10/18/21 1014  Weight: 184 lb 11.2 oz (83.8 kg)   GENERAL: well appearing middle aged  Caucasian male alert, no distress and comfortable SKIN: skin color, texture, turgor are normal, no rashes or significant lesions LYMPH: No enlarged cervical or supraclavicular lymph nodes. EYES: conjunctiva are pink and non-injected, sclera clear LUNGS: clear to auscultation and percussion with normal breathing effort HEART: regular rate & rhythm and no murmurs and no lower extremity edema Musculoskeletal: no cyanosis of digits and no clubbing  PSYCH: alert & oriented x 3, fluent speech NEURO: no focal motor/sensory deficits  LABORATORY DATA:  I have reviewed the data as listed    Latest Ref Rng & Units 10/18/2021    9:58 AM  07/18/2021   10:01 AM 04/12/2021    2:28 PM  CBC  WBC 4.0 - 10.5 K/uL 3.9  3.8  4.9   Hemoglobin 13.0 - 17.0 g/dL 15.8  15.4  15.4   Hematocrit 39.0 - 52.0 % 46.4  45.1  45.5   Platelets 150 - 400 K/uL 248  220  231        Latest Ref Rng & Units 10/18/2021    9:58 AM 07/18/2021   10:01 AM 04/12/2021    2:28 PM  CMP  Glucose 70 - 99 mg/dL 77  95  110   BUN 6 - 20 mg/dL '15  16  15   '$ Creatinine 0.61 - 1.24 mg/dL 1.16  1.13  1.14   Sodium 135 - 145 mmol/L 141  140  140   Potassium 3.5 - 5.1 mmol/L 4.4  4.8  3.9   Chloride 98 - 111 mmol/L 107  108  111   CO2 22 - 32 mmol/L '28  27  24   '$ Calcium 8.9 - 10.3 mg/dL 9.3  9.4  9.2   Total Protein 6.5 - 8.1 g/dL 7.0  6.7  7.0   Total Bilirubin 0.3 - 1.2 mg/dL 0.6  0.7  0.4   Alkaline Phos 38 - 126 U/L 62  54  55   AST 15 - 41 U/L '16  17  21   '$ ALT 0 - 44 U/L '18  16  20     '$ Lab Results  Component Value Date   MPROTEIN Not Observed 04/07/2020   Lab Results  Component Value Date   KPAFRELGTCHN 11.4 04/07/2020   LAMBDASER 9.0 04/07/2020   KAPLAMBRATIO 1.27 04/07/2020     RADIOGRAPHIC STUDIES: CT CHEST ABDOMEN PELVIS W CONTRAST  Result Date: 10/14/2021 CLINICAL DATA:  Hematologic malignancy, monitor. History of diffuse large B-cell lymphoma. * Tracking Code: BO * EXAM: CT CHEST, ABDOMEN, AND PELVIS WITH  CONTRAST TECHNIQUE: Multidetector CT imaging of the chest, abdomen and pelvis was performed following the standard protocol during bolus administration of intravenous contrast. RADIATION DOSE REDUCTION: This exam was performed according to the departmental dose-optimization program which includes automated exposure control, adjustment of the mA and/or kV according to patient size and/or use of iterative reconstruction technique. CONTRAST:  171m OMNIPAQUE IOHEXOL 300 MG/ML  SOLN COMPARISON:  Multiple priors including most recent CT chest abdomen and pelvis dated March 24, 2021 FINDINGS: CT CHEST FINDINGS Cardiovascular: Normal caliber thoracic aorta. Aortic atherosclerosis. No central pulmonary embolus on this nondedicated study. Normal size heart. No significant pericardial effusion/thickening. Mediastinum/Nodes: No supraclavicular adenopathy. No suspicious thyroid nodule. No pathologically enlarged mediastinal, hilar or axillary lymph nodes. Mild symmetric wall thickening of the distal esophagus is similar prior subtle paraspinal soft tissue thickening is decreased in the interval with a maximum thickness on today's examination of 4 mm on image 35/2 previously 7 mm. Lungs/Pleura: Centrilobular emphysema. Stable tiny pulmonary nodules. No new suspicious pulmonary nodules or masses. For instance: -tiny right middle lobe perifissural pulmonary nodule measures 4 mm on image 74/4, unchanged. -left upper lobe pulmonary nodule measures 5 mm on image 93/4, unchanged. -posterior right upper lobe perifissural pulmonary nodule measures 4 mm on image 44/4, unchanged. Scattered bilateral subsegmental atelectasis. No pleural effusion. No pneumothorax. Musculoskeletal: No suspicious lytic or blastic lesion of bone. CT ABDOMEN PELVIS FINDINGS Hepatobiliary: No suspicious hepatic lesion. Gallbladder is unremarkable. No biliary ductal dilation. Pancreas: No pancreatic ductal dilation or evidence of acute inflammation. Spleen: No  splenomegaly. Adrenals/Urinary Tract: Bilateral adrenal glands  appear normal. No hydronephrosis. Kidneys demonstrate symmetric enhancement and excretion of contrast material. Urinary bladder is unremarkable for degree of distension. Stomach/Bowel: Radiopaque enteric contrast material traverses the rectum. Stomach is unremarkable for degree of distension. No pathologic dilation of small or large bowel. No evidence of acute bowel inflammation. Colonic diverticulosis without findings of acute diverticulitis. Vascular/Lymphatic: Normal caliber abdominal aorta. No pathologically enlarged abdominal or pelvic lymph nodes. Reproductive: Prostate is unremarkable. Other: No significant abdominopelvic free fluid. Fat containing left inguinal hernia. Musculoskeletal: No aggressive lytic or blastic lesion of bone. Multilevel degenerative change of the spine. Degenerative change of the SI joints with partial bony ankylosis of the left SI joint. IMPRESSION: 1. No adenopathy above or below the diaphragm and no splenomegaly to suggest recurrent lymphomatous disease. 2. Stable mild symmetric wall thickening of the distal esophagus possibly reflecting esophagitis consider further evaluation with upper endoscopy if clinically indicated 3. Stable tiny bilateral pulmonary nodules. 4. Colonic diverticulosis without findings of acute diverticulitis. 5. Aortic Atherosclerosis (ICD10-I70.0) and Emphysema (ICD10-J43.9). Electronically Signed   By: Dahlia Bailiff M.D.   On: 10/14/2021 11:22    ASSESSMENT & PLAN Shaun Clarke 61 y.o. 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  After review the labs, the records, schedule the patient the findings most consistent with a diffuse large B-cell lymphoma with staging in process.  We completed the staging with a PET CT scan which showed his disease does appear as higher stage (III or IV). At this time we will plan for at least 6  cycles of R-CHOP followed by repeat scans.    Previously we discussed the risks and benefits of R-CHOP chemotherapy.  We noted the nature of diffuse large B-cell lymphoma and the possibility of achieving a complete remission with this chemotherapy regimen.  I also noted the side effects which include but are not limited to nausea, vomiting, hair loss, diarrhea, sedation, insomnia, hyperglycemia, high blood pressure, and cardiotoxicity.  The patient voices understanding of the risk and benefits and was agreeable to proceeding with treatment at this time.   The R-CHOP regimen consists of rituximab 375 mg per metered squared IV on day 1, cyclophosphamide 750 mg per metered squared on day 1, doxorubicin 50 mg/m on day 1, vincristine 1.4 mg per metered squared with a max dose of 2 mg on day 1, and prednisone 100 mg orally days 1 through 5.  A cycle lasts for 21 days and number of cycles planned will depend on the stage as determined by the PET CT scan.  IPI Prognostic Index: 2 points Good Prognosis (80% progression free survival)  # Diffuse Large B Cell Lymphoma, Stage IV --Findings are most consistent with a diffuse large B-cell lymphoma metastatic to the spine.  Given his metastatic disease in the spine he is at least a stage III or IV. -- PET CT scan on 07/04/2020 showed complete metabolic response.  Similar results on posttreatment PET CT scan in September 2020 --FISH testing negative for double HIT, we proceeded with R-CHOP chemotherapy as noted above.  --Cycle 1 Day 1 started on 05/04/2020 --Post treatment PET CT scan on 09/08/2020 showed no evidence of disease with Deauville 2 Plan: --labs today show white blood cell count 3.9, hemoglobin 15.8, and platelets of 248. --CT scan on 10/13/2021 showed no evidence of residual or recurrent disease. --Plan for patient return to clinic in 12 weeks with CT scan in 6 months   No  orders of the defined types were placed in this encounter.  All questions were  answered. The patient knows to call the clinic with any problems, questions or concerns.  A total of more than 30 minutes were spent on this encounter and over half of that time was spent on counseling and coordination of care as outlined above.   Ledell Peoples, MD Department of Hematology/Oncology [place] [facility] Phone: [phone] Pager: [phone] Email: [email or web address]  10/21/2021 4:34 PM

## 2021-11-27 ENCOUNTER — Encounter: Payer: Self-pay | Admitting: Family Medicine

## 2021-11-27 ENCOUNTER — Ambulatory Visit (INDEPENDENT_AMBULATORY_CARE_PROVIDER_SITE_OTHER): Payer: BC Managed Care – PPO | Admitting: Family Medicine

## 2021-11-27 VITALS — BP 123/73 | HR 74 | Temp 98.4°F | Ht 68.0 in | Wt 189.4 lb

## 2021-11-27 DIAGNOSIS — Z0000 Encounter for general adult medical examination without abnormal findings: Secondary | ICD-10-CM | POA: Diagnosis not present

## 2021-11-27 DIAGNOSIS — E785 Hyperlipidemia, unspecified: Secondary | ICD-10-CM

## 2021-11-27 DIAGNOSIS — K219 Gastro-esophageal reflux disease without esophagitis: Secondary | ICD-10-CM

## 2021-11-27 DIAGNOSIS — Z0001 Encounter for general adult medical examination with abnormal findings: Secondary | ICD-10-CM | POA: Diagnosis not present

## 2021-11-27 MED ORDER — PANTOPRAZOLE SODIUM 40 MG PO TBEC: 40.0000 mg | DELAYED_RELEASE_TABLET | Freq: Every day | ORAL | 3 refills | Status: DC

## 2021-11-27 MED ORDER — METHOCARBAMOL 500 MG PO TABS: 500.0000 mg | ORAL_TABLET | Freq: Four times a day (QID) | ORAL | 3 refills | Status: DC | PRN

## 2021-11-27 NOTE — Progress Notes (Signed)
BP 123/73   Pulse 74   Temp 98.4 F (36.9 C) (Temporal)   Ht _0  (1.727 m)   Wt 189 lb 6.4 oz (85.9 kg)   SpO2 97%   BMI 28.80 kg/m    Subjective:   Patient ID: Shaun Clarke, male    DOB: [birth year], 61 y.o.   MRN: [id number]  HPI: Shaun Clarke is a 61 y.o. male presenting on 11/27/2021 for Annual Exam   HPI Physical exam Patient is coming in today for physical exam and recheck of chronic medical issues.  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  He has not used anything for them currently but does want to mention that stable.  He said overall he is feeling a lot better than it was last year when he was going through chemotherapy.  Patient denies any chest pain, shortness of breath, headaches or vision issues, abdominal complaints, diarrhea, nausea, vomiting, or joint issues.   Relevant past medical, surgical, family and social history reviewed and updated as indicated. Interim medical history since our last visit reviewed. Allergies and medications reviewed and updated.  Review of Systems  Constitutional:  Negative for chills and fever.  HENT:  Negative for ear pain and tinnitus.   Eyes:  Negative for pain and visual disturbance.  Respiratory:  Negative for cough, shortness of breath and wheezing.   Cardiovascular:  Negative for chest pain, palpitations and leg swelling.  Gastrointestinal:  Negative for abdominal pain, blood in stool, constipation and diarrhea.  Genitourinary:  Negative for dysuria and hematuria.  Musculoskeletal:  Negative for back pain, gait problem and myalgias.  Skin:  Negative for rash.  Neurological:  Negative for dizziness, weakness, light-headedness and headaches.  Psychiatric/Behavioral:  Negative for suicidal ideas.   All other  systems reviewed and are negative.   Per HPI unless specifically indicated above   Allergies as of 11/27/2021       Reactions   Naproxen Anaphylaxis, Swelling, Other (See Comments)   Swelling of face and lips   Gadolinium Derivatives Hives, Itching        Medication List        Accurate as of November 27, 2021  3:24 PM. If you have any questions, ask your nurse or doctor.          methocarbamol 500 MG tablet Commonly known as: ROBAXIN Take 1 tablet (500 mg total) by mouth every 6 (six) hours as needed for muscle spasms.   pantoprazole 40 MG tablet Commonly known as: PROTONIX Take 1 tablet (40 mg total) by mouth daily. What changed:  how much to take when to take this Changed by: Fransisca Kaufmann Sephira Zellman, MD         Objective:   BP 123/73   Pulse 74   Temp 98.4 F (36.9 C) (Temporal)   Ht _1  (1.727 m)   Wt 189 lb 6.4 oz (85.9 kg)   SpO2 97%   BMI 28.80 kg/m   Wt Readings from Last 3 Encounters:  11/27/21 189 lb 6.4 oz (85.9 kg)  10/18/21 184 lb 11.2 oz (83.8 kg)  07/18/21 181 lb 14.4 oz (82.5 kg)    Physical Exam Vitals reviewed.  Constitutional:      General: He is not in acute distress.  Appearance: He is well-developed. He is not diaphoretic.  HENT:     Right Ear: External ear normal.     Left Ear: External ear normal.     Nose: Nose normal.     Mouth/Throat:     Pharynx: No oropharyngeal exudate.  Eyes:     General: No scleral icterus.       Right eye: No discharge.     Conjunctiva/sclera: Conjunctivae normal.     Pupils: Pupils are equal, round, and reactive to light.  Neck:     Thyroid: No thyromegaly.  Cardiovascular:     Rate and Rhythm: Normal rate and regular rhythm.     Heart sounds: Normal heart sounds. No murmur heard. Pulmonary:     Effort: Pulmonary effort is normal. No respiratory distress.     Breath sounds: Normal breath sounds. No wheezing.  Abdominal:     General: Bowel sounds are normal. There is no distension.      Palpations: Abdomen is soft.     Tenderness: There is no abdominal tenderness. There is no guarding or rebound.  Musculoskeletal:        General: Normal range of motion.     Cervical back: Neck supple.  Lymphadenopathy:     Cervical: No cervical adenopathy.  Skin:    General: Skin is warm and dry.     Findings: No rash.  Neurological:     Mental Status: He is alert and oriented to person, place, and time.     Coordination: Coordination normal.  Psychiatric:        Behavior: Behavior normal.       Assessment & Plan:   Problem List Items Addressed This Visit       Digestive   GERD   Relevant Medications   pantoprazole (PROTONIX) 40 MG tablet   Other Relevant Orders   CBC with Differential/Platelet   CMP14+EGFR     Other   HLD (hyperlipidemia)   Relevant Orders   Lipid panel   Other Visit Diagnoses     Well adult exam    -  Primary   Relevant Medications   methocarbamol (ROBAXIN) 500 MG tablet   Other Relevant Orders   CBC with Differential/Platelet   CMP14+EGFR   Lipid panel   PSA, total and free       Continue current medicine.  Recommended Voltaren gel for his knees.  We will do blood work. Follow up plan: Return in about 1 year (around 11/28/2022), or if symptoms worsen or fail to improve, for Physical exam.  Counseling provided for all of the vaccine components Orders Placed This Encounter  Procedures   CBC with Differential/Platelet   CMP14+EGFR   Lipid panel   PSA, total and free    Caryl Pina, MD [facility] 11/27/2021, 3:24 PM

## 2021-11-28 ENCOUNTER — Telehealth: Payer: Self-pay | Admitting: Family Medicine

## 2021-11-28 LAB — CMP14+EGFR
ALT: 20 IU/L (ref 0–44)
AST: 23 IU/L (ref 0–40)
Albumin/Globulin Ratio: 2 (ref 1.2–2.2)
Albumin: 4.3 g/dL (ref 3.8–4.9)
Alkaline Phosphatase: 80 IU/L (ref 44–121)
BUN/Creatinine Ratio: 13 (ref 10–24)
BUN: 14 mg/dL (ref 8–27)
Bilirubin Total: 0.3 mg/dL (ref 0.0–1.2)
CO2: 22 mmol/L (ref 20–29)
Calcium: 9.4 mg/dL (ref 8.6–10.2)
Chloride: 104 mmol/L (ref 96–106)
Creatinine, Ser: 1.08 mg/dL (ref 0.76–1.27)
Globulin, Total: 2.2 g/dL (ref 1.5–4.5)
Glucose: 94 mg/dL (ref 70–99)
Potassium: 4.4 mmol/L (ref 3.5–5.2)
Sodium: 139 mmol/L (ref 134–144)
Total Protein: 6.5 g/dL (ref 6.0–8.5)
eGFR: 79 mL/min/{1.73_m2} (ref >59)

## 2021-11-28 LAB — CBC WITH DIFFERENTIAL/PLATELET
Basophils Absolute: 0.1 10*3/uL (ref 0.0–0.2)
Basos: 1 %
EOS (ABSOLUTE): 0.1 10*3/uL (ref 0.0–0.4)
Eos: 2 %
Hematocrit: 51.7 % — ABNORMAL HIGH (ref 37.5–51.0)
Hemoglobin: 16.7 g/dL (ref 13.0–17.7)
Immature Grans (Abs): 0 10*3/uL (ref 0.0–0.1)
Immature Granulocytes: 0 %
Lymphocytes Absolute: 1.3 10*3/uL (ref 0.7–3.1)
Lymphs: 24 %
MCH: 28.7 pg (ref 26.6–33.0)
MCHC: 32.3 g/dL (ref 31.5–35.7)
MCV: 89 fL (ref 79–97)
Monocytes Absolute: 0.5 10*3/uL (ref 0.1–0.9)
Monocytes: 9 %
Neutrophils Absolute: 3.3 10*3/uL (ref 1.4–7.0)
Neutrophils: 64 %
Platelets: 272 10*3/uL (ref 150–450)
RBC: 5.81 x10E6/uL — ABNORMAL HIGH (ref 4.14–5.80)
RDW: 13.3 % (ref 11.6–15.4)
WBC: 5.3 10*3/uL (ref 3.4–10.8)

## 2021-11-28 LAB — LIPID PANEL
Chol/HDL Ratio: 4.3 ratio (ref 0.0–5.0)
Cholesterol, Total: 212 mg/dL — ABNORMAL HIGH (ref 100–199)
HDL: 49 mg/dL (ref >39)
LDL Chol Calc (NIH): 123 mg/dL — ABNORMAL HIGH (ref 0–99)
Triglycerides: 226 mg/dL — ABNORMAL HIGH (ref 0–149)
VLDL Cholesterol Cal: 40 mg/dL (ref 5–40)

## 2021-11-28 LAB — PSA, TOTAL AND FREE
PSA, Free Pct: 21.3 %
PSA, Free: 0.34 ng/mL
Prostate Specific Ag, Serum: 1.6 ng/mL (ref 0.0–4.0)

## 2021-11-28 NOTE — Telephone Encounter (Signed)
PA for Protonix-In Process  Key: [facility]   PA [id number]   Your information has been submitted to [facility] [place]. [employer] [place] will review the request and notify you of the determination decision directly, typically within 72 hours of receiving all information.  You will also receive your request decision electronically. To check for an update later, open this request again from your dashboard.  If [employer] has not responded within the specified timeframe or if you have any questions about your PA submission, contact [place]  directly at [phone].

## 2021-11-29 NOTE — Telephone Encounter (Signed)
[place]n Key: [facility] - Rx #: [id number] Need help? Call us at [phone] Outcome Approvedon November 21 Effective from 11/28/2021 through 11/27/2022. Drug Pantoprazole Sodium '40MG'$  dr tablets  Pharmacy informed

## 2022-01-18 ENCOUNTER — Other Ambulatory Visit: Payer: Self-pay | Admitting: Hematology and Oncology

## 2022-01-18 ENCOUNTER — Other Ambulatory Visit: Payer: Self-pay

## 2022-01-18 ENCOUNTER — Inpatient Hospital Stay (HOSPITAL_BASED_OUTPATIENT_CLINIC_OR_DEPARTMENT_OTHER): Payer: BC Managed Care – PPO | Admitting: Hematology and Oncology

## 2022-01-18 ENCOUNTER — Inpatient Hospital Stay: Payer: BC Managed Care – PPO | Attending: Hematology and Oncology

## 2022-01-18 VITALS — BP 117/76 | HR 66 | Temp 97.7°F | Resp 20 | Wt 193.1 lb

## 2022-01-18 DIAGNOSIS — Z87891 Personal history of nicotine dependence: Secondary | ICD-10-CM | POA: Insufficient documentation

## 2022-01-18 DIAGNOSIS — Z800 Family history of malignant neoplasm of digestive organs: Secondary | ICD-10-CM | POA: Diagnosis not present

## 2022-01-18 DIAGNOSIS — C8339 Diffuse large B-cell lymphoma, extranodal and solid organ sites: Secondary | ICD-10-CM

## 2022-01-18 DIAGNOSIS — C8338 Diffuse large B-cell lymphoma, lymph nodes of multiple sites: Secondary | ICD-10-CM | POA: Diagnosis not present

## 2022-01-18 LAB — CMP (CANCER CENTER ONLY)
ALT: 15 U/L (ref 0–44)
AST: 16 U/L (ref 15–41)
Albumin: 4.6 g/dL (ref 3.5–5.0)
Alkaline Phosphatase: 54 U/L (ref 38–126)
Anion gap: 6 (ref 5–15)
BUN: 14 mg/dL (ref 6–20)
CO2: 27 mmol/L (ref 22–32)
Calcium: 9.9 mg/dL (ref 8.9–10.3)
Chloride: 106 mmol/L (ref 98–111)
Creatinine: 1.07 mg/dL (ref 0.61–1.24)
GFR, Estimated: >60 mL/min (ref >60)
Glucose, Bld: 85 mg/dL (ref 70–99)
Potassium: 4.2 mmol/L (ref 3.5–5.1)
Sodium: 139 mmol/L (ref 135–145)
Total Bilirubin: 0.5 mg/dL (ref 0.3–1.2)
Total Protein: 7.2 g/dL (ref 6.5–8.1)

## 2022-01-18 LAB — CBC WITH DIFFERENTIAL (CANCER CENTER ONLY)
Abs Immature Granulocytes: 0.01 10*3/uL (ref 0.00–0.07)
Basophils Absolute: 0.1 10*3/uL (ref 0.0–0.1)
Basophils Relative: 1 %
Eosinophils Absolute: 0.1 10*3/uL (ref 0.0–0.5)
Eosinophils Relative: 1 %
HCT: 48.3 % (ref 39.0–52.0)
Hemoglobin: 16.5 g/dL (ref 13.0–17.0)
Immature Granulocytes: 0 %
Lymphocytes Relative: 26 %
Lymphs Abs: 1.2 10*3/uL (ref 0.7–4.0)
MCH: 28.5 pg (ref 26.0–34.0)
MCHC: 34.2 g/dL (ref 30.0–36.0)
MCV: 83.6 fL (ref 80.0–100.0)
Monocytes Absolute: 0.5 10*3/uL (ref 0.1–1.0)
Monocytes Relative: 9 %
Neutro Abs: 3 10*3/uL (ref 1.7–7.7)
Neutrophils Relative %: 63 %
Platelet Count: 234 10*3/uL (ref 150–400)
RBC: 5.78 MIL/uL (ref 4.22–5.81)
RDW: 14 % (ref 11.5–15.5)
WBC Count: 4.8 10*3/uL (ref 4.0–10.5)
nRBC: 0 % (ref 0.0–0.2)

## 2022-01-18 LAB — LACTATE DEHYDROGENASE: LDH: 132 U/L (ref 98–192)

## 2022-01-18 NOTE — Progress Notes (Signed)
[place] [phone]   [phone]  PROGRESS NOTE  Patient Care Team: Dettinger, Fransisca Kaufmann, MD as PCP - General (Family Medicine)  Hematological/Oncological History # Diffuse Large B Cell Lymphoma, Staging IV  04/07/2020: presented to the ED with MRI findings concerning for extensive osseous metastatic disease including large volume paraspinal and epidural tumor in the mid and lower thoracic spine  04/08/2020: establish care with Dr. Lorenso Courier while inpatient. Scheduled for laminectomy/biopsy of metastatic spine lesion.   04/08/2020: laminectomy performed, biopsy results show DLBCL.   04/26/2020: PET CT scan shows widespread skeletal involvement of the malignancy with numerous Deauville 5 score osseous lesions.  Hypermetabolic lymph nodes in the neck, chest, abdomen and pelvis. 05/04/2020: Cycle 1 Day 1 of R-CHOP.  05/23/2020: Cycle 2 Day 1 of R-CHOP 06/15/2020: Cycle 3 Day 1 of R-CHOP 07/04/2020: PET CT scan shows a complete metabolic response to therapy with only mild residual Deauville score 2 lymph nodes  07/06/2020: Cycle 4 Day 1 of R-CHOP 07/27/2020: Cycle 5 Day 1 of R-CHOP 08/17/2020: Cycle 6 Day 1 of R-CHOP 09/19/2020: post treatment PET CT scan shows no evidence of progressive disease. Deauville 2. 03/24/2021: CT scan shows no evidence of recurrent disease. 10/13/2021: CT scan shows no evidence of recurrent disease.  Interval History:  Shaun Clarke 62 y.o. 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 The patient's last visit was on 10/18/2021. In the interim since the last visit he has had no major changes in his health.  On exam today Mr. Shaun Clarke reports he had been doing well throughout the holiday but he does have bouts of "not feeling good".  He notes that he does have some sluggishness and low energy.  He developed heavy sweats particular around his neck and can get "soaking wet".  He  reports the last time this happened about 1 week ago.  He reports that some days are good days but then sometimes he has "bad weeks".  He notes that he does have some neurological pain which he is unsure if it is related to the chemotherapy, surgery, or other issues.  He notes that he has not been working much lately.  He reports he is not having any trouble with systemic symptoms.  He denies any fevers, chills, nausea, vomiting or diarrhea.  A full 10 point ROS is listed below.  MEDICAL HISTORY:  Past Medical History:  Diagnosis Date   GERD (gastroesophageal reflux disease)     SURGICAL HISTORY: Past Surgical History:  Procedure Laterality Date   APPENDECTOMY     DECOMPRESSIVE LUMBAR LAMINECTOMY LEVEL 2 N/A 04/08/2020   Procedure: THORACIC FIVE-THORACIC SEVEN OPEN LAMINECTOMY FOR RESECTION OF EPIDURAL TUMOR ;  Surgeon: Karsten Ro, DO;  Location: [place];  Service: Neurosurgery;  Laterality: N/A;   IR IMAGING GUIDED PORT INSERTION  04/29/2020   IR REMOVAL TUN ACCESS W/ PORT W/O FL MOD SED  10/24/2020   KNEE ARTHROSCOPY     right knee 3 times   SHOULDER BIOPSY     right shoulder lipoma removal    SOCIAL HISTORY: Social History   Socioeconomic History   Marital status: Married    Spouse name: Not on file   Number of children: 2   Years of education: Not on file   Highest education level: Not on file  Occupational History   Not on file  Tobacco Use   Smoking status: Former    Packs/day:  1.00    Types: Cigarettes   Smokeless tobacco: Never  Vaping Use   Vaping Use: Never used  Substance and Sexual Activity   Alcohol use: Yes    Alcohol/week: 3.0 standard drinks of alcohol    Types: 1 Glasses of wine, 2 Cans of beer per week    Comment: per week   Drug use: Never   Sexual activity: Yes    Partners: Female  Other Topics Concern   Not on file  Social History Narrative   Not on file   Social Determinants of Health   Financial Resource Strain: Not on file  Food  Insecurity: Not on file  Transportation Needs: Not on file  Physical Activity: Not on file  Stress: Not on file  Social Connections: Not on file  Intimate Partner Violence: Not on file    FAMILY HISTORY: Family History  Problem Relation Age of Onset   Pancreatic cancer Mother    Diabetes Father    Anxiety disorder Sister    Multiple sclerosis Sister    COPD Maternal Grandfather     ALLERGIES:  is allergic to naproxen and gadolinium derivatives.  MEDICATIONS:  Current Outpatient Medications  Medication Sig Dispense Refill   methocarbamol (ROBAXIN) 500 MG tablet Take 1 tablet (500 mg total) by mouth every 6 (six) hours as needed for muscle spasms. 90 tablet 3   pantoprazole (PROTONIX) 40 MG tablet Take 1 tablet (40 mg total) by mouth daily. 90 tablet 3   No current facility-administered medications for this visit.    REVIEW OF SYSTEMS:   Constitutional: ( - ) fevers, ( - )  chills , ( - ) night sweats Eyes: ( - ) blurriness of vision, ( - ) double vision, ( - ) watery eyes Ears, nose, mouth, throat, and face: ( - ) mucositis, ( - ) sore throat Respiratory: ( - ) cough, ( - ) dyspnea, ( - ) wheezes Cardiovascular: ( - ) palpitation, ( - ) chest discomfort, ( - ) lower extremity swelling Gastrointestinal:  ( - ) nausea, ( - ) heartburn, ( - ) change in bowel habits Skin: ( - ) abnormal skin rashes Lymphatics: ( - ) new lymphadenopathy, ( - ) easy bruising Neurological: ( - ) numbness, ( - ) tingling, ( - ) new weaknesses Behavioral/Psych: ( - ) mood change, ( - ) new changes  All other systems were reviewed with the patient and are negative.  PHYSICAL EXAMINATION: ECOG PERFORMANCE STATUS: 1 - Symptomatic but completely ambulatory  Vitals:   01/18/22 1358  BP: 117/76  Pulse: 66  Resp: 20  Temp: 97.7 F (36.5 C)  SpO2: 95%      Filed Weights   01/18/22 1358  Weight: 193 lb 1.6 oz (87.6 kg)    GENERAL: well appearing middle aged Caucasian male alert, no distress  and comfortable SKIN: skin color, texture, turgor are normal, no rashes or significant lesions LYMPH: No enlarged cervical or supraclavicular lymph nodes. EYES: conjunctiva are pink and non-injected, sclera clear LUNGS: clear to auscultation and percussion with normal breathing effort HEART: regular rate & rhythm and no murmurs and no lower extremity edema Musculoskeletal: no cyanosis of digits and no clubbing  PSYCH: alert & oriented x 3, fluent speech NEURO: no focal motor/sensory deficits  LABORATORY DATA:  I have reviewed the data as listed    Latest Ref Rng & Units 01/18/2022    1:44 PM 11/27/2021    3:25 PM 10/18/2021    9:58  AM  CBC  WBC 4.0 - 10.5 K/uL 4.8  5.3  3.9   Hemoglobin 13.0 - 17.0 g/dL 16.5  16.7  15.8   Hematocrit 39.0 - 52.0 % 48.3  51.7  46.4   Platelets 150 - 400 K/uL 234  272  248        Latest Ref Rng & Units 01/18/2022    1:44 PM 11/27/2021    3:25 PM 10/18/2021    9:58 AM  CMP  Glucose 70 - 99 mg/dL 85  94  77   BUN 6 - 20 mg/dL '14  14  15   '$ Creatinine 0.61 - 1.24 mg/dL 1.07  1.08  1.16   Sodium 135 - 145 mmol/L 139  139  141   Potassium 3.5 - 5.1 mmol/L 4.2  4.4  4.4   Chloride 98 - 111 mmol/L 106  104  107   CO2 22 - 32 mmol/L '27  22  28   '$ Calcium 8.9 - 10.3 mg/dL 9.9  9.4  9.3   Total Protein 6.5 - 8.1 g/dL 7.2  6.5  7.0   Total Bilirubin 0.3 - 1.2 mg/dL 0.5  0.3  0.6   Alkaline Phos 38 - 126 U/L 54  80  62   AST 15 - 41 U/L '16  23  16   '$ ALT 0 - 44 U/L '15  20  18     '$ Lab Results  Component Value Date   MPROTEIN Not Observed 04/07/2020   Lab Results  Component Value Date   KPAFRELGTCHN 11.4 04/07/2020   LAMBDASER 9.0 04/07/2020   KAPLAMBRATIO 1.27 04/07/2020     RADIOGRAPHIC STUDIES: No results found.  ASSESSMENT & PLAN Shaun Clarke 62 y.o. 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  After review the labs, the records, schedule the patient the findings most  consistent with a diffuse large B-cell lymphoma with staging in process.  We completed the staging with a PET CT scan which showed his disease does appear as higher stage (III or IV). At this time we will plan for at least 6 cycles of R-CHOP followed by repeat scans.    Previously we discussed the risks and benefits of R-CHOP chemotherapy.  We noted the nature of diffuse large B-cell lymphoma and the possibility of achieving a complete remission with this chemotherapy regimen.  I also noted the side effects which include but are not limited to nausea, vomiting, hair loss, diarrhea, sedation, insomnia, hyperglycemia, high blood pressure, and cardiotoxicity.  The patient voices understanding of the risk and benefits and was agreeable to proceeding with treatment at this time.   The R-CHOP regimen consists of rituximab 375 mg per metered squared IV on day 1, cyclophosphamide 750 mg per metered squared on day 1, doxorubicin 50 mg/m on day 1, vincristine 1.4 mg per metered squared with a max dose of 2 mg on day 1, and prednisone 100 mg orally days 1 through 5.  A cycle lasts for 21 days and number of cycles planned will depend on the stage as determined by the PET CT scan.  IPI Prognostic Index: 2 points Good Prognosis (80% progression free survival)  # Diffuse Large B Cell Lymphoma, Stage IV-In remission --Findings are most consistent with a diffuse large B-cell lymphoma metastatic to the spine.  Given his metastatic disease in the spine he is at least a stage III or IV. -- PET CT scan on  07/04/2020 showed complete metabolic response.  Similar results on posttreatment PET CT scan in September 2020 --FISH testing negative for double HIT, we proceeded with R-CHOP chemotherapy as noted above.  --Cycle 1 Day 1 started on 05/04/2020 --Post treatment PET CT scan on 09/08/2020 showed no evidence of disease with Deauville 2 Plan: --labs today show white blood cell count 4.8, hemoglobin 16.5, MCV 83.6, and platelets  of 234. --CT scan on 10/13/2021 showed no evidence of residual or recurrent disease. --Patient is having sweats and is nervous about neurological symptoms he has been having.  Requested earlier CT scan.  We will order CT chest abdomen pelvis to be performed in the next 1 to 2 weeks. --Plan for patient return to clinic in 3 months    Orders Placed This Encounter  Procedures   CT CHEST ABDOMEN PELVIS W CONTRAST    Standing Status:   Future    Standing Expiration Date:   01/19/2023    Order Specific Question:   If indicated for the ordered procedure, I authorize the administration of contrast media per Radiology protocol    Answer:   Yes    Order Specific Question:   Does the patient have a contrast media/X-ray dye allergy?    Answer:   No    Order Specific Question:   Preferred imaging location?    Answer:   [facility]    Order Specific Question:   Is Oral Contrast requested for this exam?    Answer:   Yes, Per Radiology protocol   All questions were answered. The patient knows to call the clinic with any problems, questions or concerns.  A total of more than 30 minutes were spent on this encounter and over half of that time was spent on counseling and coordination of care as outlined above.   Ledell Peoples, MD Department of Hematology/Oncology [place] [facility] Phone: [phone] Pager: [phone] Email: [email or web address]  01/18/2022 4:32 PM

## 2022-01-19 ENCOUNTER — Telehealth: Payer: Self-pay | Admitting: Hematology and Oncology

## 2022-01-19 NOTE — Telephone Encounter (Signed)
Called patient to schedule f/u. Patient scheduled and notified.

## 2022-01-30 ENCOUNTER — Encounter: Payer: Self-pay | Admitting: Hematology and Oncology

## 2022-01-30 ENCOUNTER — Telehealth: Payer: Self-pay

## 2022-01-30 ENCOUNTER — Ambulatory Visit (HOSPITAL_COMMUNITY)
Admission: RE | Admit: 2022-01-30 | Discharge: 2022-01-30 | Disposition: A | Discharge status: Home / Self Care | Payer: BC Managed Care – PPO | Source: Ambulatory Visit | Attending: Hematology and Oncology | Admitting: Hematology and Oncology

## 2022-01-30 DIAGNOSIS — C8339 Diffuse large B-cell lymphoma, extranodal and solid organ sites: Secondary | ICD-10-CM | POA: Diagnosis not present

## 2022-01-30 DIAGNOSIS — R918 Other nonspecific abnormal finding of lung field: Secondary | ICD-10-CM | POA: Diagnosis not present

## 2022-01-30 DIAGNOSIS — C83398 Diffuse large b-cell lymphoma of other extranodal and solid organ sites: Secondary | ICD-10-CM

## 2022-01-30 DIAGNOSIS — I700 Atherosclerosis of aorta: Secondary | ICD-10-CM | POA: Diagnosis not present

## 2022-01-30 DIAGNOSIS — J439 Emphysema, unspecified: Secondary | ICD-10-CM | POA: Diagnosis not present

## 2022-01-30 DIAGNOSIS — M160 Bilateral primary osteoarthritis of hip: Secondary | ICD-10-CM | POA: Diagnosis not present

## 2022-01-30 DIAGNOSIS — K5730 Diverticulosis of large intestine without perforation or abscess without bleeding: Secondary | ICD-10-CM | POA: Diagnosis not present

## 2022-01-30 DIAGNOSIS — M533 Sacrococcygeal disorders, not elsewhere classified: Secondary | ICD-10-CM | POA: Diagnosis not present

## 2022-01-30 MED ORDER — SODIUM CHLORIDE (PF) 0.9 % IJ SOLN
INTRAMUSCULAR | Status: AC
  Filled 2022-01-30: qty 50

## 2022-01-30 MED ORDER — IOHEXOL 300 MG/ML  SOLN
100.0000 mL | Freq: Once | INTRAMUSCULAR | Status: AC | PRN
  Administered 2022-01-30: 100 mL via INTRAVENOUS

## 2022-01-30 NOTE — Telephone Encounter (Addendum)
Reviewed message with patient.    ----- Message from Orson Slick, MD sent at 01/30/2022  2:36 PM EST ----- Please let Shaun Clarke know that his CT scan showed no evidence of recurrent lymphoma. He remains in remission. No other concerning abnormalities were noted on the scan. We will plan to see him as scheduled in April 2024.  ----- Message ----- From: Buel Ream, Rad Results In Sent: 01/30/2022   2:30 PM EST To: Orson Slick, MD

## 2022-01-30 NOTE — Telephone Encounter (Signed)
Called patient to advise that Dr. Lorenso Courier viewed the picture sent on my-chart and does not feel that the area of concern is related to his cancer. Patient encouraged to make appointment with PCP or dermatologist for evaluation.

## 2022-02-02 ENCOUNTER — Ambulatory Visit: Payer: BC Managed Care – PPO | Admitting: Family Medicine

## 2022-02-02 ENCOUNTER — Other Ambulatory Visit (HOSPITAL_COMMUNITY)
Admission: RE | Admit: 2022-02-02 | Discharge: 2022-02-02 | Disposition: A | Discharge status: Home / Self Care | Payer: BC Managed Care – PPO | Source: Ambulatory Visit | Attending: Family Medicine | Admitting: Family Medicine

## 2022-02-02 ENCOUNTER — Encounter: Payer: Self-pay | Admitting: Family Medicine

## 2022-02-02 VITALS — BP 136/80 | HR 79 | Ht 68.0 in | Wt 190.0 lb

## 2022-02-02 DIAGNOSIS — L570 Actinic keratosis: Secondary | ICD-10-CM | POA: Diagnosis not present

## 2022-02-02 DIAGNOSIS — D485 Neoplasm of uncertain behavior of skin: Secondary | ICD-10-CM

## 2022-02-02 DIAGNOSIS — L989 Disorder of the skin and subcutaneous tissue, unspecified: Secondary | ICD-10-CM | POA: Diagnosis not present

## 2022-02-02 NOTE — Progress Notes (Signed)
BP 136/80   Pulse 79   Ht '5\' 8"'$  (1.727 m)   Wt 190 lb (86.2 kg)   SpO2 96%   BMI 28.89 kg/m    Subjective:   Patient ID: Shaun Clarke, male    DOB: August 06, 1960, 62 y.o.   MRN: [id number]  HPI: Shaun Clarke is a 62 y.o. male presenting on 02/02/2022 for lesion (Left forearm. Red, scaly, raised, no pain)   HPI Left forearm lesion Patient who has a history of B-cell lymphoma has a left forearm lesion that is popped up that is noticed over the past few weeks.  He has been using some creams on it and it has not been helping or worsening.  It is just not been changing and he is mainly concerned because of his history of the lymphoma whether or not this could be a cancerous lesion.  He did get a dermatology appointment but they did not have anything until April and he was concerned about waiting that long.  Relevant past medical, surgical, family and social history reviewed and updated as indicated. Interim medical history since our last visit reviewed. Allergies and medications reviewed and updated.  Review of Systems  Constitutional:  Negative for chills and fever.  Respiratory:  Negative for shortness of breath and wheezing.   Cardiovascular:  Negative for chest pain and leg swelling.  Musculoskeletal:  Negative for back pain and gait problem.  Skin:  Positive for color change and rash.  All other systems reviewed and are negative.   Per HPI unless specifically indicated above   Allergies as of 02/02/2022       Reactions   Naproxen Anaphylaxis, Swelling, Other (See Comments)   Swelling of face and lips   Gadolinium Derivatives Hives, Itching        Medication List        Accurate as of February 02, 2022  2:02 PM. If you have any questions, ask your nurse or doctor.          methocarbamol 500 MG tablet Commonly known as: ROBAXIN Take 1 tablet (500 mg total) by mouth every 6 (six) hours as needed for muscle spasms.   pantoprazole 40 MG tablet Commonly known  as: PROTONIX Take 1 tablet (40 mg total) by mouth daily.         Objective:   BP 136/80   Pulse 79   Ht '5\' 8"'$  (1.727 m)   Wt 190 lb (86.2 kg)   SpO2 96%   BMI 28.89 kg/m   Wt Readings from Last 3 Encounters:  02/02/22 190 lb (86.2 kg)  01/18/22 193 lb 1.6 oz (87.6 kg)  11/27/21 189 lb 6.4 oz (85.9 kg)    Physical Exam Skin:    Findings: Lesion (Left forearm on the dorsal mid weight pink scaly tissue) present. No rash.     Skin lesion removal:  2% lidocaine with epinephrine was used for local anesthesia, 33m.  4 mm punch biopsy was made.  topical antibiotic was used and then it was covered by 4 x 4 pressure dressing and tape told in place. Procedure was tolerated well   Assessment & Plan:   Problem List Items Addressed This Visit   None Visit Diagnoses     Skin lesion    -  Primary   Relevant Orders   Surgical pathology        Follow up plan: Return if symptoms worsen or fail to improve.  Counseling provided for all of the vaccine components  No orders of the defined types were placed in this encounter.   Caryl Pina, MD [facility] 02/02/2022, 2:02 PM

## 2022-02-06 LAB — SURGICAL PATHOLOGY

## 2022-02-09 DIAGNOSIS — C8339 Diffuse large B-cell lymphoma, extranodal and solid organ sites: Secondary | ICD-10-CM | POA: Diagnosis not present

## 2022-02-09 DIAGNOSIS — Z6828 Body mass index (BMI) 28.0-28.9, adult: Secondary | ICD-10-CM | POA: Diagnosis not present

## 2022-03-02 ENCOUNTER — Ambulatory Visit: Payer: BC Managed Care – PPO | Admitting: Family Medicine

## 2022-04-04 DIAGNOSIS — M722 Plantar fascial fibromatosis: Secondary | ICD-10-CM | POA: Diagnosis not present

## 2022-04-04 DIAGNOSIS — Z6828 Body mass index (BMI) 28.0-28.9, adult: Secondary | ICD-10-CM | POA: Diagnosis not present

## 2022-04-24 ENCOUNTER — Other Ambulatory Visit: Payer: Self-pay | Admitting: Hematology and Oncology

## 2022-04-24 DIAGNOSIS — C8339 Diffuse large B-cell lymphoma, extranodal and solid organ sites: Secondary | ICD-10-CM

## 2022-04-24 NOTE — Progress Notes (Unsigned)
[facility] [phone]   [phone]  PROGRESS NOTE  Patient Care Team: Dettinger, Elige Radon, MD as PCP - General (Family Medicine)  Hematological/Oncological History # Diffuse Large B Cell Lymphoma, Staging IV  04/07/2020: presented to the ED with MRI findings concerning for extensive osseous metastatic disease including large volume paraspinal and epidural tumor in the mid and lower thoracic spine  04/08/2020: establish care with Dr. Leonides Schanz while inpatient. Scheduled for laminectomy/biopsy of metastatic spine lesion.   04/08/2020: laminectomy performed, biopsy results show DLBCL.   04/26/2020: PET CT scan shows widespread skeletal involvement of the malignancy with numerous Deauville 5 score osseous lesions.  Hypermetabolic lymph nodes in the neck, chest, abdomen and pelvis. 05/04/2020: Cycle 1 Day 1 of R-CHOP.  05/23/2020: Cycle 2 Day 1 of R-CHOP 06/15/2020: Cycle 3 Day 1 of R-CHOP 07/04/2020: PET CT scan shows a complete metabolic response to therapy with only mild residual Deauville score 2 lymph nodes  07/06/2020: Cycle 4 Day 1 of R-CHOP 07/27/2020: Cycle 5 Day 1 of R-CHOP 08/17/2020: Cycle 6 Day 1 of R-CHOP 09/19/2020: post treatment PET CT scan shows no evidence of progressive disease. Deauville 2. 03/24/2021: CT scan shows no evidence of recurrent disease. 10/13/2021: CT scan shows no evidence of recurrent disease. 01/30/2022: CT scan shows no evidence of recurrent disease.  Interval History:  Shaun Clarke 62 y.o. 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 The patient's last visit was on 01/18/2022. In the interim since the last visit he has had no major changes in his health.  On exam today Mr. Shaun Clarke reports he has been doing good overall in the interim since her last visit.  He does still have issues with near nightly sweats.  He reports they are not drenching but just a light sweat.  He  is not having any associated fevers or weight loss.  He notes he would actually like to lose some weight.  He reports that he has been eating well and has not noticed any lymphadenopathy.  He notes he has a small lesion on the roof of his mouth which is nontender.  He is unsure how long it has been there.  He also has a skin lesion on his arm which he will have evaluated by dermatology later this week.  Overall he is at his baseline level of health.  He denies any fevers, chills, nausea, vomiting or diarrhea.  A full 10 point ROS is listed below.  MEDICAL HISTORY:  Past Medical History:  Diagnosis Date   GERD (gastroesophageal reflux disease)     SURGICAL HISTORY: Past Surgical History:  Procedure Laterality Date   APPENDECTOMY     DECOMPRESSIVE LUMBAR LAMINECTOMY LEVEL 2 N/A 04/08/2020   Procedure: THORACIC FIVE-THORACIC SEVEN OPEN LAMINECTOMY FOR RESECTION OF EPIDURAL TUMOR ;  Surgeon: Bethann Goo, DO;  Location: [facility] OR;  Service: Neurosurgery;  Laterality: N/A;   IR IMAGING GUIDED PORT INSERTION  04/29/2020   IR REMOVAL TUN ACCESS W/ PORT W/O FL MOD SED  10/24/2020   KNEE ARTHROSCOPY     right knee 3 times   SHOULDER BIOPSY     right shoulder lipoma removal    SOCIAL HISTORY: Social History   Socioeconomic History   Marital status: Married    Spouse name: Not on file   Number of children: 2   Years of education: Not on file   Highest education level: Not on file  Occupational History   Not on file  Tobacco Use   Smoking status: Former    Packs/day: 1    Types: Cigarettes   Smokeless tobacco: Never  Vaping Use   Vaping Use: Never used  Substance and Sexual Activity   Alcohol use: Yes    Alcohol/week: 3.0 standard drinks of alcohol    Types: 1 Glasses of wine, 2 Cans of beer per week    Comment: per week   Drug use: Never   Sexual activity: Yes    Partners: Female  Other Topics Concern   Not on file  Social History Narrative   Not on file   Social Determinants of  Health   Financial Resource Strain: Not on file  Food Insecurity: Not on file  Transportation Needs: Not on file  Physical Activity: Not on file  Stress: Not on file  Social Connections: Not on file  Intimate Partner Violence: Not on file    FAMILY HISTORY: Family History  Problem Relation Age of Onset   Pancreatic cancer Mother    Diabetes Father    Anxiety disorder Sister    Multiple sclerosis Sister    COPD Maternal Grandfather     ALLERGIES:  is allergic to naproxen and gadolinium derivatives.  MEDICATIONS:  Current Outpatient Medications  Medication Sig Dispense Refill   methocarbamol (ROBAXIN) 500 MG tablet Take 1 tablet (500 mg total) by mouth every 6 (six) hours as needed for muscle spasms. 90 tablet 3   pantoprazole (PROTONIX) 40 MG tablet Take 1 tablet (40 mg total) by mouth daily. 90 tablet 3   No current facility-administered medications for this visit.    REVIEW OF SYSTEMS:   Constitutional: ( - ) fevers, ( - )  chills , ( - ) night sweats Eyes: ( - ) blurriness of vision, ( - ) double vision, ( - ) watery eyes Ears, nose, mouth, throat, and face: ( - ) mucositis, ( - ) sore throat Respiratory: ( - ) cough, ( - ) dyspnea, ( - ) wheezes Cardiovascular: ( - ) palpitation, ( - ) chest discomfort, ( - ) lower extremity swelling Gastrointestinal:  ( - ) nausea, ( - ) heartburn, ( - ) change in bowel habits Skin: ( - ) abnormal skin rashes Lymphatics: ( - ) new lymphadenopathy, ( - ) easy bruising Neurological: ( - ) numbness, ( - ) tingling, ( - ) new weaknesses Behavioral/Psych: ( - ) mood change, ( - ) new changes  All other systems were reviewed with the patient and are negative.  PHYSICAL EXAMINATION: ECOG PERFORMANCE STATUS: 1 - Symptomatic but completely ambulatory  Vitals:   04/25/22 1450  BP: 122/73  Pulse: 74  Resp: 15  Temp: 97.8 F (36.6 C)  SpO2: 96%       Filed Weights   04/25/22 1450  Weight: 187 lb 8 oz (85 kg)     GENERAL:  well appearing middle aged Caucasian male alert, no distress and comfortable SKIN: skin color, texture, turgor are normal, no rashes or significant lesions LYMPH: No enlarged cervical or supraclavicular lymph nodes. EYES: conjunctiva are pink and non-injected, sclera clear LUNGS: clear to auscultation and percussion with normal breathing effort HEART: regular rate & rhythm and no murmurs and no lower extremity edema Musculoskeletal: no cyanosis of digits and no clubbing  PSYCH: alert & oriented x 3, fluent speech NEURO: no focal motor/sensory deficits  LABORATORY DATA:  I have reviewed the data as listed    Latest  Ref Rng & Units 04/25/2022    2:23 PM 01/18/2022    1:44 PM 11/27/2021    3:25 PM  CBC  WBC 4.0 - 10.5 K/uL 5.2  4.8  5.3   Hemoglobin 13.0 - 17.0 g/dL 16.1  09.6  04.5   Hematocrit 39.0 - 52.0 % 44.7  48.3  51.7   Platelets 150 - 400 K/uL 238  234  272        Latest Ref Rng & Units 04/25/2022    2:23 PM 01/18/2022    1:44 PM 11/27/2021    3:25 PM  CMP  Glucose 70 - 99 mg/dL 409  85  94   BUN 6 - 20 mg/dL Creatinine 0.61 - 1.24 mg/dL 8.11  9.14  7.82   Sodium 135 - 145 mmol/L 139  139  139   Potassium 3.5 - 5.1 mmol/L 4.2  4.2  4.4   Chloride 98 - 111 mmol/L 107  106  104   CO2 22 - 32 mmol/L Calcium 8.9 - 10.3 mg/dL 9.5  9.9  9.4   Total Protein 6.5 - 8.1 g/dL 6.9  7.2  6.5   Total Bilirubin 0.3 - 1.2 mg/dL 0.6  0.5  0.3   Alkaline Phos 38 - 126 U/L 57  54  80   AST 15 - 41 U/L ALT 0 - 44 U/L Lab Results  Component Value Date   MPROTEIN Not Observed 04/07/2020   Lab Results  Component Value Date   KPAFRELGTCHN 11.4 04/07/2020   LAMBDASER 9.0 04/07/2020   KAPLAMBRATIO 1.27 04/07/2020     RADIOGRAPHIC STUDIES: No results found.  ASSESSMENT & PLAN KYLIE GROS 62 y.o. 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  After review the  labs, the records, schedule the patient the findings most consistent with a diffuse large B-cell lymphoma with staging in process.  We completed the staging with a PET CT scan which showed his disease does appear as higher stage (III or IV). At this time we will plan for at least 6 cycles of R-CHOP followed by repeat scans.    Previously we discussed the risks and benefits of R-CHOP chemotherapy.  We noted the nature of diffuse large B-cell lymphoma and the possibility of achieving a complete remission with this chemotherapy regimen.  I also noted the side effects which include but are not limited to nausea, vomiting, hair loss, diarrhea, sedation, insomnia, hyperglycemia, high blood pressure, and cardiotoxicity.  The patient voices understanding of the risk and benefits and was agreeable to proceeding with treatment at this time.   The R-CHOP regimen consists of rituximab 375 mg per metered squared IV on day 1, cyclophosphamide 750 mg per metered squared on day 1, doxorubicin 50 mg/m on day 1, vincristine 1.4 mg per metered squared with a max dose of 2 mg on day 1, and prednisone 100 mg orally days 1 through 5.  A cycle lasts for 21 days and number of cycles planned will depend on the stage as determined by the PET CT scan.  IPI Prognostic Index: 2 points Good Prognosis (80% progression free survival)  # Diffuse Large B Cell Lymphoma, Stage IV-In remission --Findings are most consistent with a diffuse large B-cell lymphoma metastatic to the spine.  Given his metastatic disease in the spine he is at least a stage III or IV. -- PET CT scan on 07/04/2020 showed complete metabolic response.  Similar results on posttreatment PET CT scan in September 2020 --FISH testing negative for double HIT, we proceeded with R-CHOP chemotherapy as noted above.  --Cycle 1 Day 1 started on 05/04/2020 --Post treatment PET CT scan on 09/08/2020 showed no evidence of disease with Deauville 2 Plan: --labs today show white blood  cell count 5.2, Hgb 15.2, Mcv 84.2, Plt 238 --CT scan on 01/30/2022 showed no evidence of residual or recurrent disease. Next due in July 2024.  --Provided reassurance that his labs and imaging showed no evidence of recurrence.  The patient does worry about this on a regular basis. --Plan for patient return to clinic in 3 months    Orders Placed This Encounter  Procedures   CT CHEST ABDOMEN PELVIS W CONTRAST    Standing Status:   Future    Standing Expiration Date:   04/25/2023    Order Specific Question:   If indicated for the ordered procedure, I authorize the administration of contrast media per Radiology protocol    Answer:   Yes    Order Specific Question:   Does the patient have a contrast media/X-ray dye allergy?    Answer:   No    Order Specific Question:   Preferred imaging location?    Answer:   [facility]    Order Specific Question:   If indicated for the ordered procedure, I authorize the administration of oral contrast media per Radiology protocol    Answer:   Yes   All questions were answered. The patient knows to call the clinic with any problems, questions or concerns.  A total of more than 30 minutes were spent on this encounter and over half of that time was spent on counseling and coordination of care as outlined above.   Ulysees Barns, MD Department of Hematology/Oncology [facility] Phone: [phone] Pager: [phone] Email: [email or web address]  04/25/2022 4:18 PM

## 2022-04-25 ENCOUNTER — Inpatient Hospital Stay (HOSPITAL_BASED_OUTPATIENT_CLINIC_OR_DEPARTMENT_OTHER): Payer: BC Managed Care – PPO | Admitting: Hematology and Oncology

## 2022-04-25 ENCOUNTER — Inpatient Hospital Stay: Payer: BC Managed Care – PPO | Attending: Hematology and Oncology

## 2022-04-25 ENCOUNTER — Other Ambulatory Visit: Payer: Self-pay

## 2022-04-25 VITALS — BP 122/73 | HR 74 | Temp 97.8°F | Resp 15 | Wt 187.5 lb

## 2022-04-25 DIAGNOSIS — C8338 Diffuse large B-cell lymphoma, lymph nodes of multiple sites: Secondary | ICD-10-CM | POA: Insufficient documentation

## 2022-04-25 DIAGNOSIS — Z800 Family history of malignant neoplasm of digestive organs: Secondary | ICD-10-CM | POA: Diagnosis not present

## 2022-04-25 DIAGNOSIS — C8339 Diffuse large B-cell lymphoma, extranodal and solid organ sites: Secondary | ICD-10-CM | POA: Diagnosis not present

## 2022-04-25 DIAGNOSIS — Z95828 Presence of other vascular implants and grafts: Secondary | ICD-10-CM | POA: Diagnosis not present

## 2022-04-25 DIAGNOSIS — C7951 Secondary malignant neoplasm of bone: Secondary | ICD-10-CM | POA: Insufficient documentation

## 2022-04-25 DIAGNOSIS — Z87891 Personal history of nicotine dependence: Secondary | ICD-10-CM | POA: Diagnosis not present

## 2022-04-25 DIAGNOSIS — C83398 Diffuse large b-cell lymphoma of other extranodal and solid organ sites: Secondary | ICD-10-CM

## 2022-04-25 LAB — CBC WITH DIFFERENTIAL (CANCER CENTER ONLY)
Abs Immature Granulocytes: 0.01 10*3/uL (ref 0.00–0.07)
Basophils Absolute: 0.1 10*3/uL (ref 0.0–0.1)
Basophils Relative: 1 %
Eosinophils Absolute: 0.1 10*3/uL (ref 0.0–0.5)
Eosinophils Relative: 1 %
HCT: 44.7 % (ref 39.0–52.0)
Hemoglobin: 15.2 g/dL (ref 13.0–17.0)
Immature Granulocytes: 0 %
Lymphocytes Relative: 23 %
Lymphs Abs: 1.2 10*3/uL (ref 0.7–4.0)
MCH: 28.6 pg (ref 26.0–34.0)
MCHC: 34 g/dL (ref 30.0–36.0)
MCV: 84.2 fL (ref 80.0–100.0)
Monocytes Absolute: 0.5 10*3/uL (ref 0.1–1.0)
Monocytes Relative: 9 %
Neutro Abs: 3.4 10*3/uL (ref 1.7–7.7)
Neutrophils Relative %: 66 %
Platelet Count: 238 10*3/uL (ref 150–400)
RBC: 5.31 MIL/uL (ref 4.22–5.81)
RDW: 14.4 % (ref 11.5–15.5)
WBC Count: 5.2 10*3/uL (ref 4.0–10.5)
nRBC: 0 % (ref 0.0–0.2)

## 2022-04-25 LAB — CMP (CANCER CENTER ONLY)
ALT: 13 U/L (ref 0–44)
AST: 14 U/L — ABNORMAL LOW (ref 15–41)
Albumin: 4.3 g/dL (ref 3.5–5.0)
Alkaline Phosphatase: 57 U/L (ref 38–126)
Anion gap: 6 (ref 5–15)
BUN: 16 mg/dL (ref 6–20)
CO2: 26 mmol/L (ref 22–32)
Calcium: 9.5 mg/dL (ref 8.9–10.3)
Chloride: 107 mmol/L (ref 98–111)
Creatinine: 1.26 mg/dL — ABNORMAL HIGH (ref 0.61–1.24)
GFR, Estimated: >60 mL/min (ref >60)
Glucose, Bld: 107 mg/dL — ABNORMAL HIGH (ref 70–99)
Potassium: 4.2 mmol/L (ref 3.5–5.1)
Sodium: 139 mmol/L (ref 135–145)
Total Bilirubin: 0.6 mg/dL (ref 0.3–1.2)
Total Protein: 6.9 g/dL (ref 6.5–8.1)

## 2022-04-25 LAB — LACTATE DEHYDROGENASE: LDH: 119 U/L (ref 98–192)

## 2022-04-30 DIAGNOSIS — D225 Melanocytic nevi of trunk: Secondary | ICD-10-CM | POA: Diagnosis not present

## 2022-04-30 DIAGNOSIS — L570 Actinic keratosis: Secondary | ICD-10-CM | POA: Diagnosis not present

## 2022-04-30 DIAGNOSIS — L814 Other melanin hyperpigmentation: Secondary | ICD-10-CM | POA: Diagnosis not present

## 2022-06-21 DIAGNOSIS — R590 Localized enlarged lymph nodes: Secondary | ICD-10-CM | POA: Diagnosis not present

## 2022-06-21 DIAGNOSIS — K1120 Sialoadenitis, unspecified: Secondary | ICD-10-CM | POA: Diagnosis not present

## 2022-06-21 DIAGNOSIS — R220 Localized swelling, mass and lump, head: Secondary | ICD-10-CM | POA: Diagnosis not present

## 2022-07-10 ENCOUNTER — Ambulatory Visit (HOSPITAL_COMMUNITY): Payer: BC Managed Care – PPO

## 2022-07-10 ENCOUNTER — Other Ambulatory Visit: Payer: Self-pay | Admitting: *Deleted

## 2022-07-10 DIAGNOSIS — C8339 Diffuse large B-cell lymphoma, extranodal and solid organ sites: Secondary | ICD-10-CM

## 2022-07-23 ENCOUNTER — Ambulatory Visit
Admission: RE | Admit: 2022-07-23 | Discharge: 2022-07-23 | Disposition: A | Discharge status: Home / Self Care | Payer: BC Managed Care – PPO | Source: Ambulatory Visit | Attending: Hematology and Oncology | Admitting: Hematology and Oncology

## 2022-07-23 DIAGNOSIS — C83398 Diffuse large b-cell lymphoma of other extranodal and solid organ sites: Secondary | ICD-10-CM

## 2022-07-23 DIAGNOSIS — C8339 Diffuse large B-cell lymphoma, extranodal and solid organ sites: Secondary | ICD-10-CM

## 2022-07-23 DIAGNOSIS — K5730 Diverticulosis of large intestine without perforation or abscess without bleeding: Secondary | ICD-10-CM | POA: Diagnosis not present

## 2022-07-23 MED ORDER — IOPAMIDOL (ISOVUE-300) INJECTION 61%
100.0000 mL | Freq: Once | INTRAVENOUS | Status: AC | PRN
  Administered 2022-07-23: 100 mL via INTRAVENOUS

## 2022-07-25 ENCOUNTER — Inpatient Hospital Stay: Payer: BC Managed Care – PPO | Attending: Hematology and Oncology

## 2022-07-25 ENCOUNTER — Inpatient Hospital Stay: Payer: BC Managed Care – PPO | Admitting: Hematology and Oncology

## 2022-07-25 ENCOUNTER — Other Ambulatory Visit: Payer: Self-pay | Admitting: Hematology and Oncology

## 2022-07-25 VITALS — BP 114/76 | HR 80 | Temp 97.9°F | Resp 15 | Wt 184.9 lb

## 2022-07-25 DIAGNOSIS — Z800 Family history of malignant neoplasm of digestive organs: Secondary | ICD-10-CM | POA: Diagnosis not present

## 2022-07-25 DIAGNOSIS — Z8579 Personal history of other malignant neoplasms of lymphoid, hematopoietic and related tissues: Secondary | ICD-10-CM | POA: Diagnosis not present

## 2022-07-25 DIAGNOSIS — Z87891 Personal history of nicotine dependence: Secondary | ICD-10-CM | POA: Insufficient documentation

## 2022-07-25 DIAGNOSIS — C8339 Diffuse large B-cell lymphoma, extranodal and solid organ sites: Secondary | ICD-10-CM

## 2022-07-25 DIAGNOSIS — C83398 Diffuse large b-cell lymphoma of other extranodal and solid organ sites: Secondary | ICD-10-CM

## 2022-07-25 LAB — CBC WITH DIFFERENTIAL (CANCER CENTER ONLY)
Abs Immature Granulocytes: 0.01 10*3/uL (ref 0.00–0.07)
Basophils Absolute: 0.1 10*3/uL (ref 0.0–0.1)
Basophils Relative: 1 %
Eosinophils Absolute: 0 10*3/uL (ref 0.0–0.5)
Eosinophils Relative: 1 %
HCT: 45.3 % (ref 39.0–52.0)
Hemoglobin: 15.5 g/dL (ref 13.0–17.0)
Immature Granulocytes: 0 %
Lymphocytes Relative: 19 %
Lymphs Abs: 1.1 10*3/uL (ref 0.7–4.0)
MCH: 28.8 pg (ref 26.0–34.0)
MCHC: 34.2 g/dL (ref 30.0–36.0)
MCV: 84.2 fL (ref 80.0–100.0)
Monocytes Absolute: 0.5 10*3/uL (ref 0.1–1.0)
Monocytes Relative: 9 %
Neutro Abs: 4 10*3/uL (ref 1.7–7.7)
Neutrophils Relative %: 70 %
Platelet Count: 246 10*3/uL (ref 150–400)
RBC: 5.38 MIL/uL (ref 4.22–5.81)
RDW: 14.6 % (ref 11.5–15.5)
WBC Count: 5.6 10*3/uL (ref 4.0–10.5)
nRBC: 0 % (ref 0.0–0.2)

## 2022-07-25 LAB — CMP (CANCER CENTER ONLY)
ALT: 17 U/L (ref 0–44)
AST: 16 U/L (ref 15–41)
Albumin: 4.4 g/dL (ref 3.5–5.0)
Alkaline Phosphatase: 63 U/L (ref 38–126)
Anion gap: 7 (ref 5–15)
BUN: 20 mg/dL (ref 6–20)
CO2: 27 mmol/L (ref 22–32)
Calcium: 9.9 mg/dL (ref 8.9–10.3)
Chloride: 108 mmol/L (ref 98–111)
Creatinine: 1.42 mg/dL — ABNORMAL HIGH (ref 0.61–1.24)
GFR, Estimated: 57 mL/min — ABNORMAL LOW (ref >60)
Glucose, Bld: 75 mg/dL (ref 70–99)
Potassium: 4.5 mmol/L (ref 3.5–5.1)
Sodium: 142 mmol/L (ref 135–145)
Total Bilirubin: 0.5 mg/dL (ref 0.3–1.2)
Total Protein: 7.1 g/dL (ref 6.5–8.1)

## 2022-07-25 LAB — LACTATE DEHYDROGENASE: LDH: 135 U/L (ref 98–192)

## 2022-07-25 NOTE — Progress Notes (Signed)
[facility] [phone]   [phone]  PROGRESS NOTE  Patient Care Team: Dettinger, Elige Radon, MD as PCP - General (Family Medicine)  Hematological/Oncological History # Diffuse Large B Cell Lymphoma, Staging IV  04/07/2020: presented to the ED with MRI findings concerning for extensive osseous metastatic disease including large volume paraspinal and epidural tumor in the mid and lower thoracic spine  04/08/2020: establish care with Dr. Leonides Schanz while inpatient. Scheduled for laminectomy/biopsy of metastatic spine lesion.   04/08/2020: laminectomy performed, biopsy results show DLBCL.   04/26/2020: PET CT scan shows widespread skeletal involvement of the malignancy with numerous Deauville 5 score osseous lesions.  Hypermetabolic lymph nodes in the neck, chest, abdomen and pelvis. 05/04/2020: Cycle 1 Day 1 of R-CHOP.  05/23/2020: Cycle 2 Day 1 of R-CHOP 06/15/2020: Cycle 3 Day 1 of R-CHOP 07/04/2020: PET CT scan shows a complete metabolic response to therapy with only mild residual Deauville score 2 lymph nodes  07/06/2020: Cycle 4 Day 1 of R-CHOP 07/27/2020: Cycle 5 Day 1 of R-CHOP 08/17/2020: Cycle 6 Day 1 of R-CHOP 09/19/2020: post treatment PET CT scan shows no evidence of progressive disease. Deauville 2. 03/24/2021: CT scan shows no evidence of recurrent disease. 10/13/2021: CT scan shows no evidence of recurrent disease. 01/30/2022: CT scan shows no evidence of recurrent disease.  Interval History:  Shaun Clarke 62 y.o. 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 The patient's last visit was on 04/25/2022. In the interim since the last visit he has had no major changes in his health.  On exam today Mr. Shaun Clarke reports he has been doing pretty well overall in the room since her last visit.  He did not do anything for 4 July as it was "too hot".  He notes he does have some occasional discomfort in his  left groin, and thinks this may be the fat filled hernia noted on our most recent CT scan.  He reports he does have "normal aches and pains that "come and go".  He notes that he feels like he is about 94% recovered from his back surgery and is not currently having any back pain.  His weight is down to 184 pounds, down from 190 pounds.  He notes that he would like to weigh closer to 166 pounds.  Overall his energy levels are good and his appetite is strong.  He has no questions concerns or complaints today.  Overall he is at his baseline level of health.  He denies any fevers, chills, nausea, vomiting or diarrhea.  A full 10 point ROS is listed below.  MEDICAL HISTORY:  Past Medical History:  Diagnosis Date   GERD (gastroesophageal reflux disease)     SURGICAL HISTORY: Past Surgical History:  Procedure Laterality Date   APPENDECTOMY     DECOMPRESSIVE LUMBAR LAMINECTOMY LEVEL 2 N/A 04/08/2020   Procedure: THORACIC FIVE-THORACIC SEVEN OPEN LAMINECTOMY FOR RESECTION OF EPIDURAL TUMOR ;  Surgeon: Bethann Goo, DO;  Location: [facility] OR;  Service: Neurosurgery;  Laterality: N/A;   IR IMAGING GUIDED PORT INSERTION  04/29/2020   IR REMOVAL TUN ACCESS W/ PORT W/O FL MOD SED  10/24/2020   KNEE ARTHROSCOPY     right knee 3 times   SHOULDER BIOPSY     right shoulder lipoma removal    SOCIAL HISTORY: Social History   Socioeconomic History   Marital status: Married    Spouse name: Not on file  Number of children: 2   Years of education: Not on file   Highest education level: Not on file  Occupational History   Not on file  Tobacco Use   Smoking status: Former    Current packs/day: 1.00    Types: Cigarettes   Smokeless tobacco: Never  Vaping Use   Vaping status: Never Used  Substance and Sexual Activity   Alcohol use: Yes    Alcohol/week: 3.0 standard drinks of alcohol    Types: 1 Glasses of wine, 2 Cans of beer per week    Comment: per week   Drug use: Never   Sexual activity: Yes     Partners: Female  Other Topics Concern   Not on file  Social History Narrative   Not on file   Social Determinants of Health   Financial Resource Strain: Not on file  Food Insecurity: Not on file  Transportation Needs: Not on file  Physical Activity: Not on file  Stress: Not on file  Social Connections: Not on file  Intimate Partner Violence: Not on file    FAMILY HISTORY: Family History  Problem Relation Age of Onset   Pancreatic cancer Mother    Diabetes Father    Anxiety disorder Sister    Multiple sclerosis Sister    COPD Maternal Grandfather     ALLERGIES:  is allergic to naproxen and gadolinium derivatives.  MEDICATIONS:  Current Outpatient Medications  Medication Sig Dispense Refill   methocarbamol (ROBAXIN) 500 MG tablet Take 1 tablet (500 mg total) by mouth every 6 (six) hours as needed for muscle spasms. 90 tablet 3   pantoprazole (PROTONIX) 40 MG tablet Take 1 tablet (40 mg total) by mouth daily. 90 tablet 3   No current facility-administered medications for this visit.    REVIEW OF SYSTEMS:   Constitutional: ( - ) fevers, ( - )  chills , ( - ) night sweats Eyes: ( - ) blurriness of vision, ( - ) double vision, ( - ) watery eyes Ears, nose, mouth, throat, and face: ( - ) mucositis, ( - ) sore throat Respiratory: ( - ) cough, ( - ) dyspnea, ( - ) wheezes Cardiovascular: ( - ) palpitation, ( - ) chest discomfort, ( - ) lower extremity swelling Gastrointestinal:  ( - ) nausea, ( - ) heartburn, ( - ) change in bowel habits Skin: ( - ) abnormal skin rashes Lymphatics: ( - ) new lymphadenopathy, ( - ) easy bruising Neurological: ( - ) numbness, ( - ) tingling, ( - ) new weaknesses Behavioral/Psych: ( - ) mood change, ( - ) new changes  All other systems were reviewed with the patient and are negative.  PHYSICAL EXAMINATION: ECOG PERFORMANCE STATUS: 1 - Symptomatic but completely ambulatory  Vitals:   07/25/22 1519  BP: 114/76  Pulse: 80  Resp: 15  Temp:  97.9 F (36.6 C)  SpO2: 97%        Filed Weights   07/25/22 1519  Weight: 184 lb 14.4 oz (83.9 kg)      GENERAL: well appearing middle aged Caucasian male alert, no distress and comfortable SKIN: skin color, texture, turgor are normal, no rashes or significant lesions LYMPH: No enlarged cervical or supraclavicular lymph nodes. EYES: conjunctiva are pink and non-injected, sclera clear LUNGS: clear to auscultation and percussion with normal breathing effort HEART: regular rate & rhythm and no murmurs and no lower extremity edema Musculoskeletal: no cyanosis of digits and no clubbing  PSYCH: alert & oriented  x 3, fluent speech NEURO: no focal motor/sensory deficits  LABORATORY DATA:  I have reviewed the data as listed    Latest Ref Rng & Units 07/25/2022    2:23 PM 04/25/2022    2:23 PM 01/18/2022    1:44 PM  CBC  WBC 4.0 - 10.5 K/uL 5.6  5.2  4.8   Hemoglobin 13.0 - 17.0 g/dL 16.1  09.6  04.5   Hematocrit 39.0 - 52.0 % 45.3  44.7  48.3   Platelets 150 - 400 K/uL 246  238  234        Latest Ref Rng & Units 07/25/2022    2:23 PM 04/25/2022    2:23 PM 01/18/2022    1:44 PM  CMP  Glucose 70 - 99 mg/dL 75  409  85   BUN 6 - 20 mg/dL 20  16  14    Creatinine 0.61 - 1.24 mg/dL 8.11  9.14  7.82   Sodium 135 - 145 mmol/L 142  139  139   Potassium 3.5 - 5.1 mmol/L 4.5  4.2  4.2   Chloride 98 - 111 mmol/L 108  107  106   CO2 22 - 32 mmol/L 27  26  27    Calcium 8.9 - 10.3 mg/dL 9.9  9.5  9.9   Total Protein 6.5 - 8.1 g/dL 7.1  6.9  7.2   Total Bilirubin 0.3 - 1.2 mg/dL 0.5  0.6  0.5   Alkaline Phos 38 - 126 U/L 63  57  54   AST 15 - 41 U/L 16  14  16    ALT 0 - 44 U/L 17  13  15      Lab Results  Component Value Date   MPROTEIN Not Observed 04/07/2020   Lab Results  Component Value Date   KPAFRELGTCHN 11.4 04/07/2020   LAMBDASER 9.0 04/07/2020   KAPLAMBRATIO 1.27 04/07/2020     RADIOGRAPHIC STUDIES: CT Abdomen Pelvis W Contrast  Result Date: 07/23/2022 CLINICAL  DATA:  Hematologic malignancy, monitor. * Tracking Code: BO * EXAM: CT ABDOMEN AND PELVIS WITH CONTRAST TECHNIQUE: Multidetector CT imaging of the abdomen and pelvis was performed using the standard protocol following bolus administration of intravenous contrast. RADIATION DOSE REDUCTION: This exam was performed according to the departmental dose-optimization program which includes automated exposure control, adjustment of the mA and/or kV according to patient size and/or use of iterative reconstruction technique. CONTRAST:  ISOVUE-300 IOPAMIDOL (ISOVUE-300) INJECTION 61% COMPARISON:  Multiple priors including most recent CT January 30, 2022 FINDINGS: Lower chest: No acute abnormality. Hepatobiliary: No suspicious hepatic lesion. Gallbladder is unremarkable. No biliary ductal dilation. Pancreas: No pancreatic ductal dilation or evidence of acute inflammation. Spleen: No splenomegaly. Adrenals/Urinary Tract: Bilateral adrenal glands appear normal. No hydronephrosis. Kidneys demonstrate symmetric enhancement. Urinary bladder is unremarkable for degree of distension. Stomach/Bowel: Radiopaque enteric contrast material traverses the rectum. Stomach is unremarkable for degree of distension. No pathologic dilation of small or large bowel. Left-sided colonic diverticulosis without findings of acute diverticulitis. Vascular/Lymphatic: Normal caliber abdominal aorta. Smooth IVC contours. The portal, splenic and superior mesenteric veins are patent. No pathologically enlarged abdominal or pelvic lymph nodes. Reproductive: Prostate is unremarkable. Other: No significant abdominopelvic free fluid. Fat containing left inguinal hernia. Musculoskeletal: No aggressive lytic or blastic lesion of bone. IMPRESSION: 1. No abdominopelvic lymphadenopathy or splenomegaly. 2. Left-sided colonic diverticulosis without findings of acute diverticulitis. 3. Fat containing left inguinal hernia. Electronically Signed   By: Maudry Mayhew  M.D.   On: 07/23/2022  15:04    ASSESSMENT & PLAN Shaun Clarke 62 y.o. 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  After review the labs, the records, schedule the patient the findings most consistent with a diffuse large B-cell lymphoma with staging in process.  We completed the staging with a PET CT scan which showed his disease does appear as higher stage (III or IV). At this time we will plan for at least 6 cycles of R-CHOP followed by repeat scans.    Previously we discussed the risks and benefits of R-CHOP chemotherapy.  We noted the nature of diffuse large B-cell lymphoma and the possibility of achieving a complete remission with this chemotherapy regimen.  I also noted the side effects which include but are not limited to nausea, vomiting, hair loss, diarrhea, sedation, insomnia, hyperglycemia, high blood pressure, and cardiotoxicity.  The patient voices understanding of the risk and benefits and was agreeable to proceeding with treatment at this time.   The R-CHOP regimen consists of rituximab 375 mg per metered squared IV on day 1, cyclophosphamide 750 mg per metered squared on day 1, doxorubicin 50 mg/m on day 1, vincristine 1.4 mg per metered squared with a max dose of 2 mg on day 1, and prednisone 100 mg orally days 1 through 5.  A cycle lasts for 21 days and number of cycles planned will depend on the stage as determined by the PET CT scan.  IPI Prognostic Index: 2 points Good Prognosis (80% progression free survival)  # Diffuse Large B Cell Lymphoma, Stage IV-In remission --Findings are most consistent with a diffuse large B-cell lymphoma metastatic to the spine.  Given his metastatic disease in the spine he is at least a stage III or IV. -- PET CT scan on 07/04/2020 showed complete metabolic response.  Similar results on posttreatment PET CT scan in September 2020 --FISH testing negative for double HIT, we proceeded  with R-CHOP chemotherapy as noted above.  --Cycle 1 Day 1 started on 05/04/2020 --Post treatment PET CT scan on 09/08/2020 showed no evidence of disease with Deauville 2 Plan: --labs today show white blood cell count 5.6, Hgb 15.5, MCV 84.2, Plt 246 --CT scan on 07/23/2022 showed no evidence of residual or recurrent disease. Next due in Jan 2024 --Provided reassurance that his labs and imaging showed no evidence of recurrence.  The patient does worry about this on a regular basis. --Plan for patient return to clinic in 6 months    Orders Placed This Encounter  Procedures   CT Chest W Contrast    [employer] COMM PPO Epic order Wt 184/No Needs/ /nt diabetic/nkda to iv dye or contrast//no kid dz or liver dz /No spinal cord/No body injector/no glucose mon/no heart monitor//ab w/pt Please remember if you need to cancel your appt, please do so 24 hours prior to your appointment to avoid getting charged a no-show fee of $75.00 pt is aware/pt verbal understood instructions given    Standing Status:   Future    Standing Expiration Date:   07/25/2023    Order Specific Question:   If indicated for the ordered procedure, I authorize the administration of contrast media per Radiology protocol    Answer:   Yes    Order Specific Question:   Does the patient have a contrast media/X-ray dye allergy?    Answer:   No    Order Specific Question:   Preferred imaging location?  Answer:   [facility]   CT CHEST ABDOMEN PELVIS W CONTRAST    Standing Status:   Future    Standing Expiration Date:   07/28/2023    Order Specific Question:   If indicated for the ordered procedure, I authorize the administration of contrast media per Radiology protocol    Answer:   Yes    Order Specific Question:   Does the patient have a contrast media/X-ray dye allergy?    Answer:   No    Order Specific Question:   Preferred imaging location?    Answer:   [facility]    Order Specific Question:   If indicated for the  ordered procedure, I authorize the administration of oral contrast media per Radiology protocol    Answer:   Yes   All questions were answered. The patient knows to call the clinic with any problems, questions or concerns.  A total of more than 30 minutes were spent on this encounter and over half of that time was spent on counseling and coordination of care as outlined above.   Ulysees Barns, MD Department of Hematology/Oncology [facility] Phone: [phone] Pager: [phone] Email: [email or web address]  07/28/2022 4:37 PM

## 2022-07-28 ENCOUNTER — Encounter: Payer: Self-pay | Admitting: Hematology and Oncology

## 2022-08-01 ENCOUNTER — Ambulatory Visit
Admission: RE | Admit: 2022-08-01 | Discharge: 2022-08-01 | Disposition: A | Discharge status: Home / Self Care | Payer: BC Managed Care – PPO | Source: Ambulatory Visit | Attending: Hematology and Oncology | Admitting: Hematology and Oncology

## 2022-08-01 DIAGNOSIS — J439 Emphysema, unspecified: Secondary | ICD-10-CM | POA: Diagnosis not present

## 2022-08-01 DIAGNOSIS — C8339 Diffuse large B-cell lymphoma, extranodal and solid organ sites: Secondary | ICD-10-CM | POA: Diagnosis not present

## 2022-08-01 DIAGNOSIS — I700 Atherosclerosis of aorta: Secondary | ICD-10-CM | POA: Diagnosis not present

## 2022-08-01 MED ORDER — IOPAMIDOL (ISOVUE-300) INJECTION 61%
75.0000 mL | Freq: Once | INTRAVENOUS | Status: AC | PRN
  Administered 2022-08-01: 75 mL via INTRAVENOUS

## 2022-08-09 ENCOUNTER — Telehealth: Payer: Self-pay | Admitting: *Deleted

## 2022-08-09 NOTE — Telephone Encounter (Signed)
-----   Message from Ulysees Barns IV sent at 08/09/2022  9:50 AM EDT ----- Please let Mr. Slavin know that his CT Chest did not show any concerning abnormalities. We will see him back as scheduled in Jan 2025 ----- Message ----- From: Leory Plowman, Rad Results In Sent: 08/08/2022  10:57 AM EDT To: Jaci Standard, MD

## 2022-08-09 NOTE — Telephone Encounter (Signed)
TCT patient regarding recent scan results.  Spoke with pt. Advised  that his CT Chest did not show any concerning abnormalities. We will see him back as scheduled in Jan 2025  Pt voiced understanding and is very pleased with these results.  He is aware of his appts next year.

## 2022-08-29 DIAGNOSIS — K449 Diaphragmatic hernia without obstruction or gangrene: Secondary | ICD-10-CM | POA: Diagnosis not present

## 2022-08-29 DIAGNOSIS — K2270 Barrett's esophagus without dysplasia: Secondary | ICD-10-CM | POA: Diagnosis not present

## 2022-08-29 DIAGNOSIS — K2970 Gastritis, unspecified, without bleeding: Secondary | ICD-10-CM | POA: Diagnosis not present

## 2022-09-23 IMAGING — RF DG C-ARM 1-60 MIN
1 series · 1 of 1 positions shown · non-contrast
Comparison: Chest CT 04/07/2020.  Thoracic spine MRI 04/06/2020.

CLINICAL DATA: Surgery, elective. Additional history provided: T5-7
laminectomy.

EXAM:
OPERATIVE THORACIC SPINE single VIEW.

[Series 1: run · 1 of 1 slices shown]
[im 1/1]
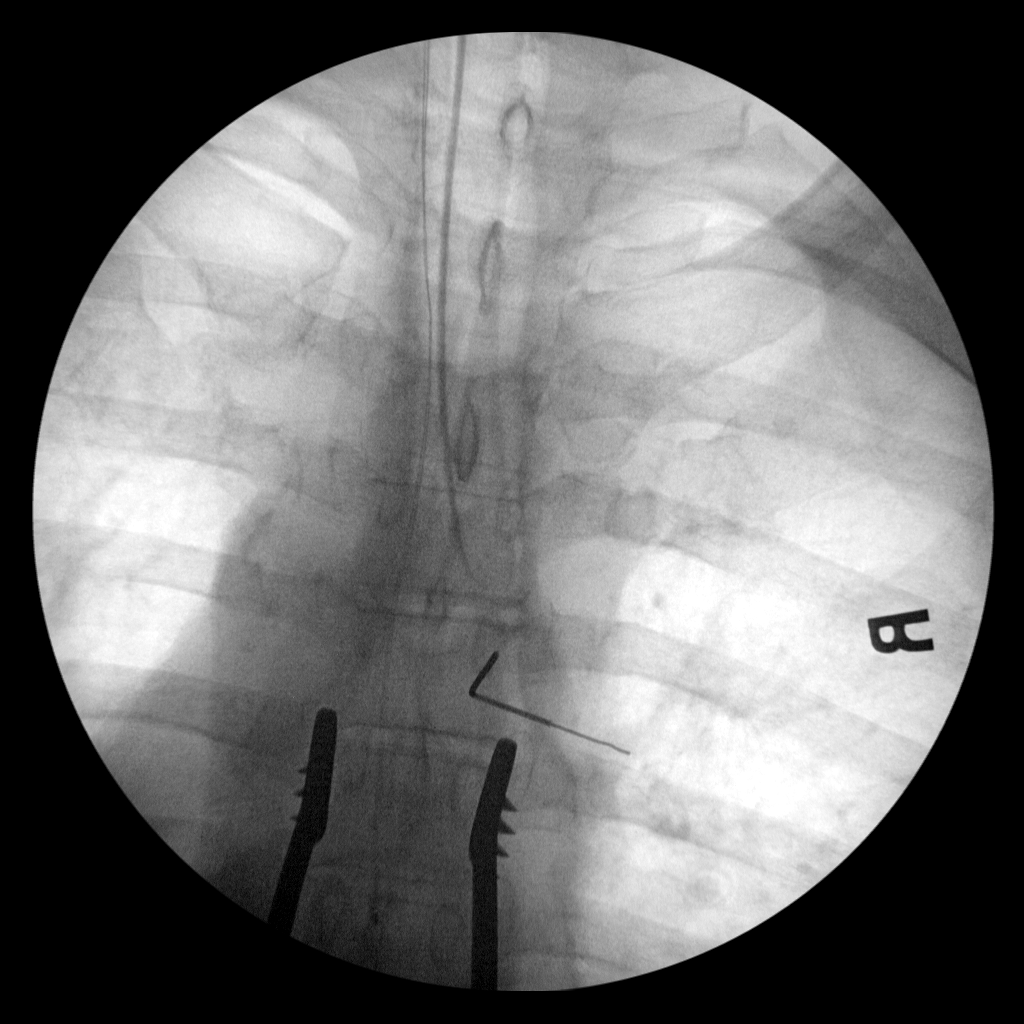

[1 of 1 positions shown; findings below may reference images not displayed]

FINDINGS: A single AP intraoperative fluoroscopic image of the thoracic spine
is submitted. The image demonstrates a metallic site marker
projecting over the C5 vertebral body. Overlying retractors.
Partially visualized support tubes.
IMPRESSION: Single AP intraoperative fluoroscopic image of the thoracic spine
with metallic site marker projecting over the T5 vertebral body, as
described.

## 2022-09-25 IMAGING — MR MR THORACIC SPINE WO/W CM
5 of 10 series · 19 of 48 positions shown · IV contrast (gadavist)
Comparison: Thoracic spine radiographs 04/08/2020. Chest CT
04/07/2020 and thoracic MRI 04/06/2020.

CLINICAL DATA: Osseous metastatic disease with epidural tumor
status post thoracic laminectomy 2 days ago.

EXAM:
MRI THORACIC WITHOUT AND WITH CONTRAST
TECHNIQUE: Multiplanar and multiecho pulse sequences of the thoracic spine were
obtained without and with intravenous contrast.
CONTRAST:  7.5mL GADAVIST GADOBUTROL 1 MMOL/ML IV SOLN

[Series 14: T1 · sagittal · 3.0mm · 0.90mm/px · 2 of 12 slices shown (1 of 3)]
[im 1/12]
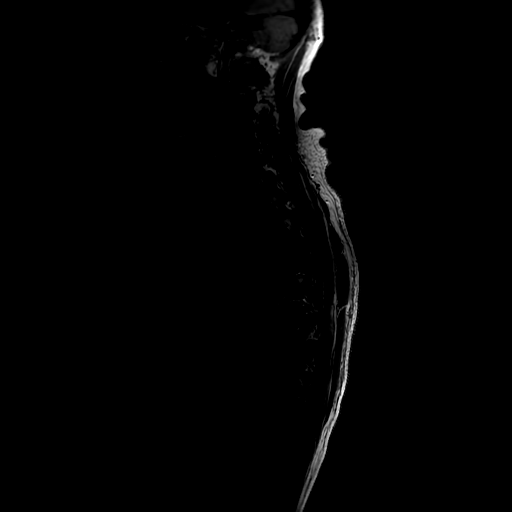
[im 12/12]
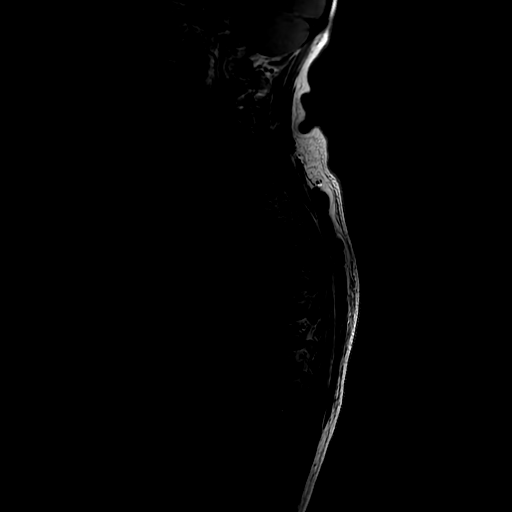

[Series 15: T2 · sagittal · 3.0mm · 0.66mm/px · 2 of 15 slices shown (1 of 2)]
[im 1/15]
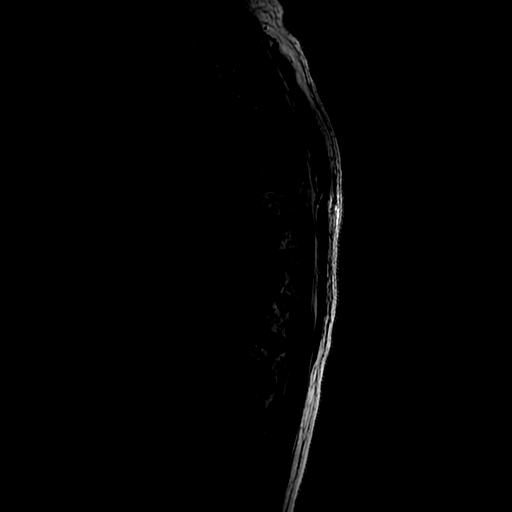
[im 15/15]
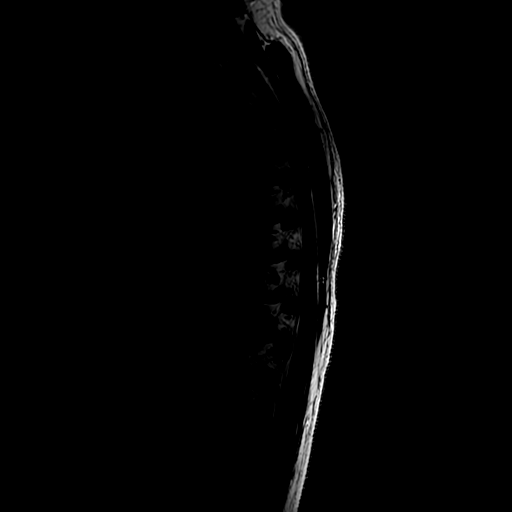

[Series 17: T1 · sagittal · 3.0mm · 0.66mm/px · 2 of 15 slices shown (2 of 3)]
[im 1/15]
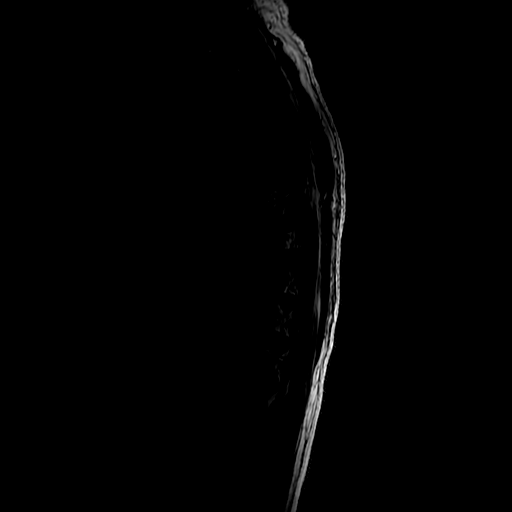
[im 15/15]
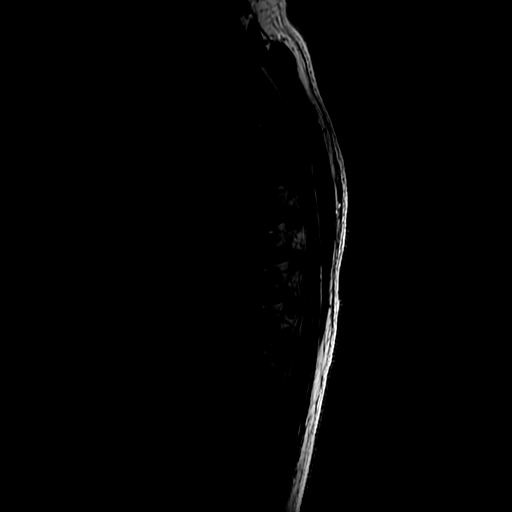

[Series 18: T2 · axial · 4.0mm · 0.39mm/px · z∈[-423,-213]mm · 8 of 49 slices shown (2 of 2)]
[im 1/49]
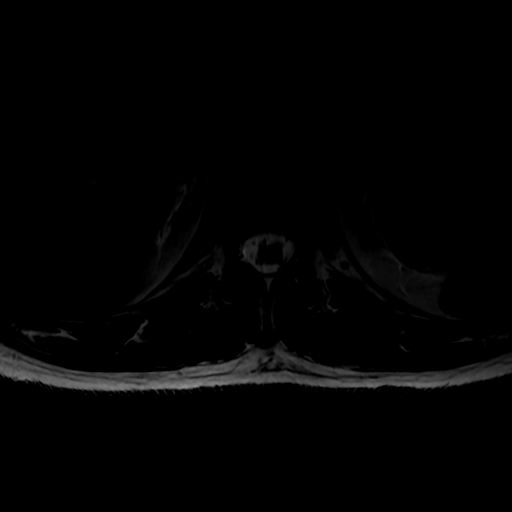
[im 7/49]
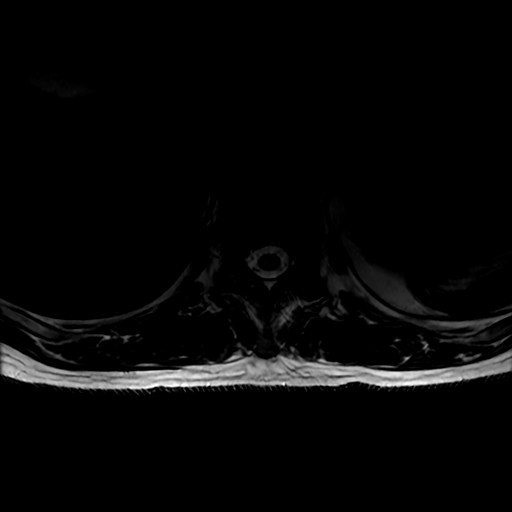
[im 14/49]
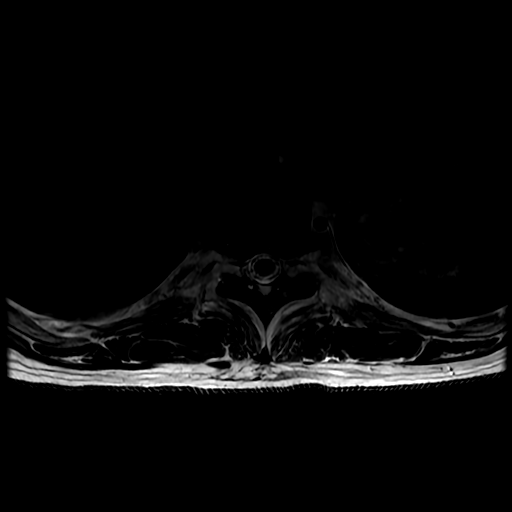
[im 21/49]
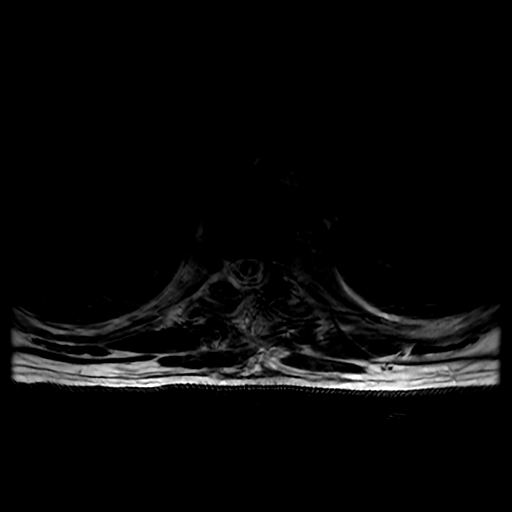
[im 28/49]
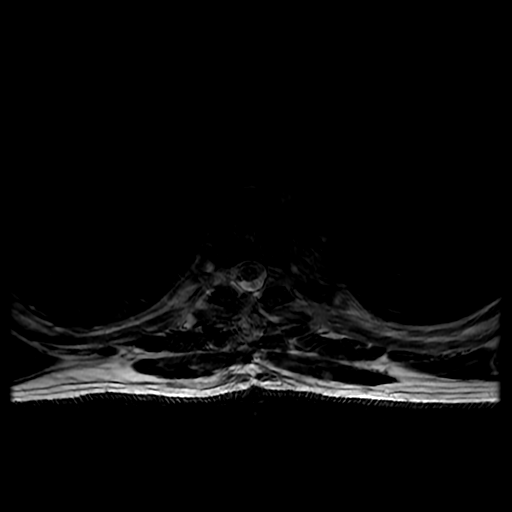
[im 35/49]
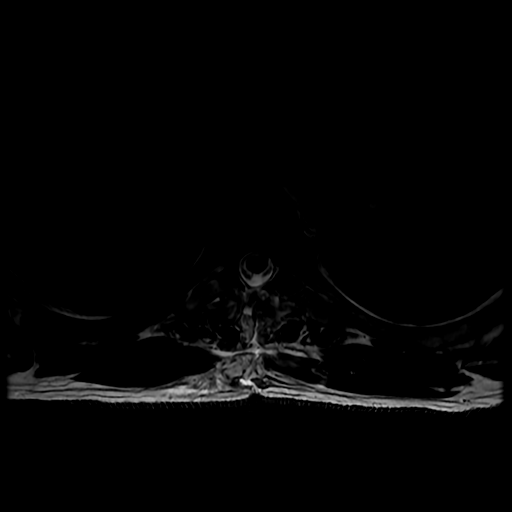
[im 42/49]
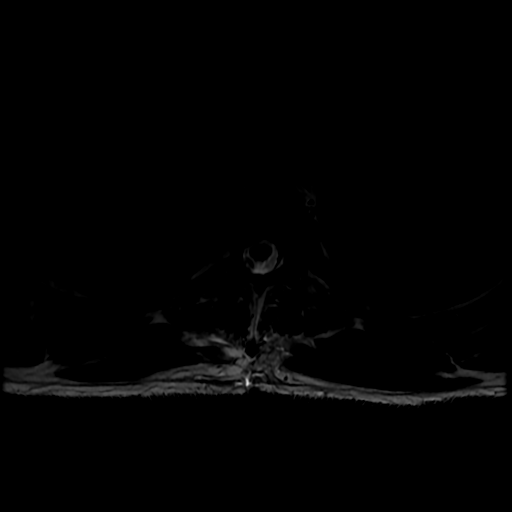
[im 49/49]
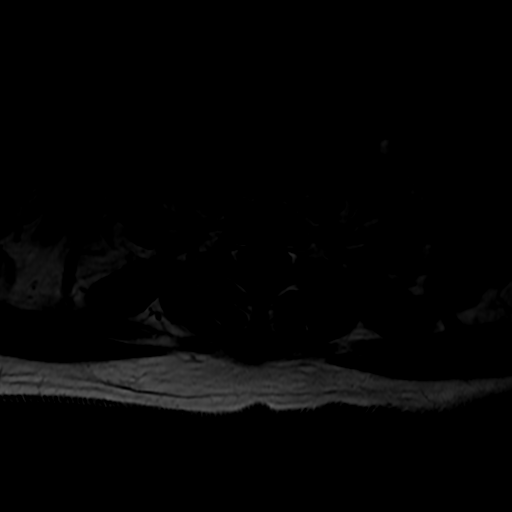

[Series 20: T1 · axial · non-contrast · 4.0mm · 0.39mm/px · z∈[-423,-292]mm · 5 of 49 slices shown (3 of 3)]
[im 1/49]
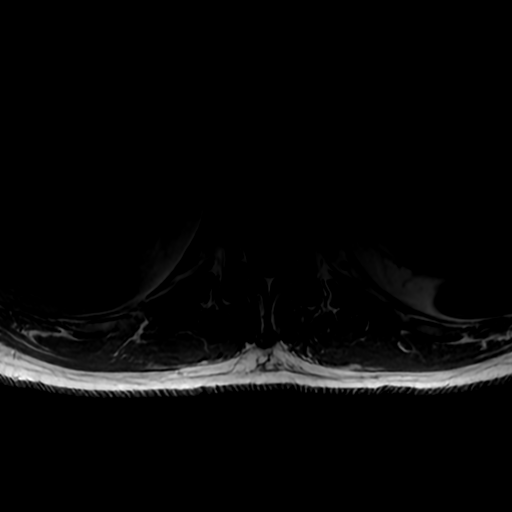
[im 7/49]
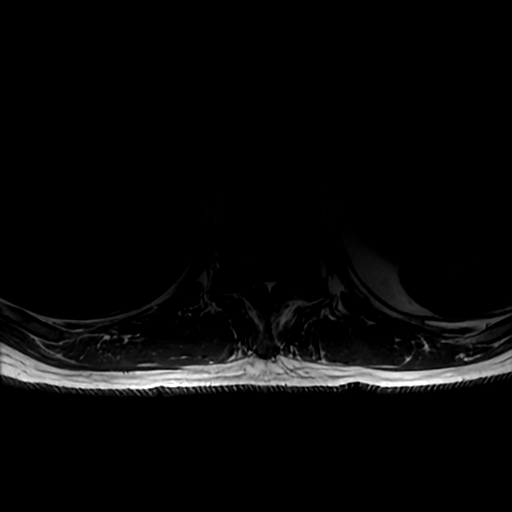
[im 14/49]
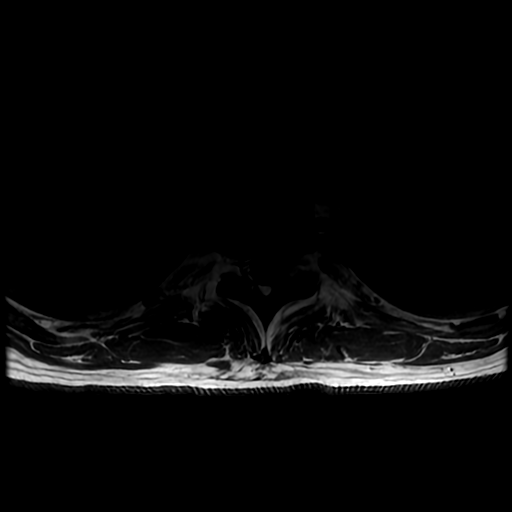
[im 21/49]
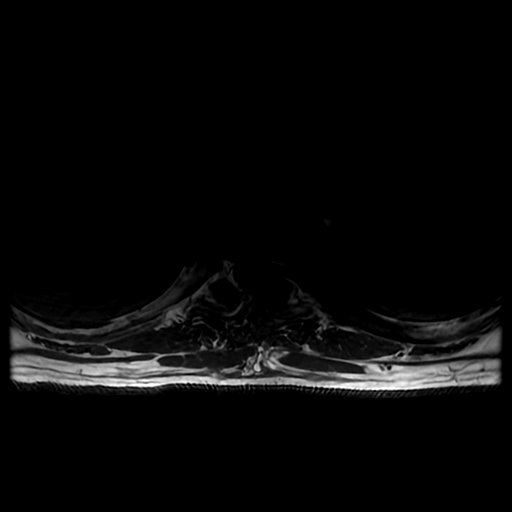
[im 28/49]
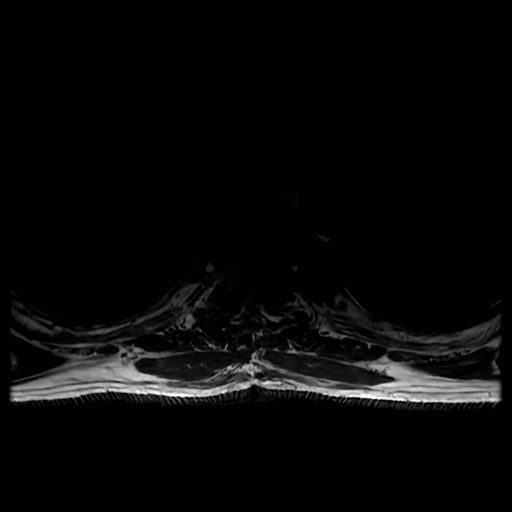

[19 of 48 positions shown; findings below may reference images not displayed]

FINDINGS: Alignment:  Normal.

Vertebrae: Patient has undergone interval wide decompressive
laminectomies at T5, T6 and T7. Multifocal osseous metastatic
disease is again noted, most prominent within the T6, T7, T8 and T9
vertebral bodies. No evidence of pathologic fracture.

Cord: Resolution of previously demonstrated cord compression in the
midthoracic spine status post decompressive laminectomy and epidural
tumor resection. No abnormal intradural enhancement.

Paraspinal and other soft tissues: Multifocal paraspinal tumor again
noted, most prominent on the left from T6 through T9. Associated
metastases within the ribs. As above, interval debridement of
posterior epidural tumor in the spinal canal. No new paraspinal
abnormalities. Increased atelectasis at both lung bases.

Disc levels:

No significant disc pathology. No residual cord deformity or
significant foraminal compromise.
IMPRESSION: 1. Interval decompressive laminectomies from T5 through T7 with
epidural tumor resection and resolved cord compression.
2. No demonstrated postoperative complications.
3. Otherwise stable multifocal osseous metastatic disease with
paraspinal tumor asymmetric to the left. No acute findings.

## 2022-11-12 ENCOUNTER — Telehealth: Payer: Self-pay | Admitting: Family Medicine

## 2022-11-12 NOTE — Telephone Encounter (Signed)
Appt 11/8 at 4:10

## 2022-11-12 NOTE — Telephone Encounter (Signed)
Pt called stating that he was told by his Oral Surgeon that they had reached out to Dr Dettinger or someone in Dr Oris Drone office about pt needing an MRI and was told that pt needed an appointment to see Dr Dettinger before he could order.  Dr Dettinger does not have any openings until December. Can he be worked in sooner?

## 2022-11-16 ENCOUNTER — Ambulatory Visit: Payer: BC Managed Care – PPO | Admitting: Family Medicine

## 2022-11-16 ENCOUNTER — Encounter: Payer: Self-pay | Admitting: Family Medicine

## 2022-11-16 VITALS — BP 100/64 | HR 67 | Temp 98.5°F | Ht 68.0 in | Wt 187.0 lb

## 2022-11-16 DIAGNOSIS — Z8572 Personal history of non-Hodgkin lymphomas: Secondary | ICD-10-CM

## 2022-11-16 DIAGNOSIS — M7989 Other specified soft tissue disorders: Secondary | ICD-10-CM | POA: Diagnosis not present

## 2022-11-16 DIAGNOSIS — K1370 Unspecified lesions of oral mucosa: Secondary | ICD-10-CM

## 2022-11-16 NOTE — Progress Notes (Unsigned)
BP 100/64   Pulse 67   Temp 98.5 F (36.9 C)   Ht 5\' 8"  (1.727 m)   Wt 187 lb (84.8 kg)   SpO2 96%   BMI 28.43 kg/m    Subjective:   Patient ID: Lenice Pressman, male    DOB: [birth year], 61 y.o.   MRN: [id number]  HPI: Nery Dastrup is a 62 y.o. male presenting on 11/16/2022 for No chief complaint on file.   HPI Patient is coming in today with a lesion on the roof of his mouth.  He says it has been there for 6 months and he went and saw a dentist and then he went and saw an oral Careers adviser.  The oral surgeon then biopsied it and found inflammation.  Patient is concerned because he does have a history of lymphoma and the lesion has grown some and is not improving.  It is not a painful lesion but it is a visible lesion on the roof of his mouth that is elevated and raised as well.  He denies any fevers or chills.  He did go see his oncologist as well and his oncologist did not know if this would be related to his cancer or not recommended a biopsy.  Patient was recommended to go to to an ear nose throat doctor and possibly get an MRI by the oral surgeon.  Patient denies any lesions anywhere else.  Relevant past medical, surgical, family and social history reviewed and updated as indicated. Interim medical history since our last visit reviewed. Allergies and medications reviewed and updated.  Review of Systems  Constitutional:  Positive for fatigue. Negative for chills and fever.  HENT:  Positive for mouth sores. Negative for congestion, ear pain, sinus pressure and sinus pain.   Neurological:  Negative for dizziness and light-headedness.    Per HPI unless specifically indicated above   Allergies as of 11/16/2022       Reactions   Naproxen Anaphylaxis, Swelling, Other (See Comments)   Swelling of face and lips   Gadolinium Derivatives Hives, Itching        Medication List        Accurate as of November 16, 2022 11:59 PM. If you have any questions, ask your nurse or doctor.           methocarbamol 500 MG tablet Commonly known as: ROBAXIN Take 1 tablet (500 mg total) by mouth every 6 (six) hours as needed for muscle spasms.   pantoprazole 40 MG tablet Commonly known as: PROTONIX Take 1 tablet (40 mg total) by mouth daily.         Objective:   BP 100/64   Pulse 67   Temp 98.5 F (36.9 C)   Ht 5\' 8"  (1.727 m)   Wt 187 lb (84.8 kg)   SpO2 96%   BMI 28.43 kg/m   Wt Readings from Last 3 Encounters:  11/16/22 187 lb (84.8 kg)  07/25/22 184 lb 14.4 oz (83.9 kg)  04/25/22 187 lb 8 oz (85 kg)    Physical Exam Vitals and nursing note reviewed.  Constitutional:      Appearance: Normal appearance.  HENT:     Mouth/Throat:   Lymphadenopathy:     Head:     Right side of head: No submental, submandibular, tonsillar, preauricular or posterior auricular adenopathy.     Left side of head: No submental, submandibular, tonsillar, preauricular or posterior auricular adenopathy.  Neurological:     Mental Status: He is alert.  Assessment & Plan:   Problem List Items Addressed This Visit   None Visit Diagnoses     Unspecified lesions of oral mucosa    -  Primary   Relevant Orders   Ambulatory referral to ENT   MR FACE/TRIGEMINAL W/CM   History of lymphoma       Relevant Orders   Ambulatory referral to ENT   MR FACE/TRIGEMINAL W/CM   Soft tissue mass           Will order MRI of head and neck trying to look for soft tissue lesions.  Will also refer to ENT for possible biopsy/surgery. Follow up plan: Return if symptoms worsen or fail to improve.  Counseling provided for all of the vaccine components Orders Placed This Encounter  Procedures   MR FACE/TRIGEMINAL W/CM   Ambulatory referral to ENT    Arville Care, MD [facility] 11/19/2022, 7:50 AM

## 2022-11-19 ENCOUNTER — Other Ambulatory Visit: Payer: Self-pay | Admitting: Family Medicine

## 2022-11-19 DIAGNOSIS — K1370 Unspecified lesions of oral mucosa: Secondary | ICD-10-CM

## 2022-11-19 NOTE — Progress Notes (Signed)
Reordered MRI to be with and without contrast

## 2022-11-20 ENCOUNTER — Encounter (INDEPENDENT_AMBULATORY_CARE_PROVIDER_SITE_OTHER): Payer: Self-pay | Admitting: Otolaryngology

## 2022-11-26 ENCOUNTER — Encounter: Payer: Self-pay | Admitting: Family Medicine

## 2022-11-26 ENCOUNTER — Other Ambulatory Visit: Payer: Self-pay | Admitting: Family Medicine

## 2022-11-26 ENCOUNTER — Telehealth: Payer: Self-pay | Admitting: Family Medicine

## 2022-11-26 DIAGNOSIS — K1370 Unspecified lesions of oral mucosa: Secondary | ICD-10-CM

## 2022-11-26 NOTE — Telephone Encounter (Signed)
Patient is scheduled on 11/28/2022.

## 2022-11-26 NOTE — Telephone Encounter (Signed)
Copied from CRM [id number]. Topic: Appointments - Scheduling Inquiry for Clinic >> Nov 26, 2022 12:05 PM Dimitri Ped wrote: Reason for CRM: patient calling concerning a mri that the  Dr is suppose to schedule. Patient requesting a call back [phone]

## 2022-11-26 NOTE — Telephone Encounter (Signed)
Spoke with [facility] (phone # [phone]) as stated in referral notes, and they apologized for not contacting the patient to schedule and are working on getting him scheduled now.   Will call patient to make him aware of this.

## 2022-11-28 ENCOUNTER — Ambulatory Visit
Admission: RE | Admit: 2022-11-28 | Discharge: 2022-11-28 | Disposition: A | Discharge status: Home / Self Care | Payer: BC Managed Care – PPO | Source: Ambulatory Visit | Attending: Family Medicine | Admitting: Family Medicine

## 2022-11-28 DIAGNOSIS — Z0189 Encounter for other specified special examinations: Secondary | ICD-10-CM | POA: Diagnosis not present

## 2022-11-28 DIAGNOSIS — K1370 Unspecified lesions of oral mucosa: Secondary | ICD-10-CM

## 2022-11-28 DIAGNOSIS — R220 Localized swelling, mass and lump, head: Secondary | ICD-10-CM | POA: Diagnosis not present

## 2022-11-28 MED ORDER — GADOPICLENOL 0.5 MMOL/ML IV SOLN
9.0000 mL | Freq: Once | INTRAVENOUS | Status: AC | PRN
  Administered 2022-11-28: 9 mL via INTRAVENOUS

## 2022-11-28 NOTE — Telephone Encounter (Signed)
Okay sounds good, thanks

## 2022-11-29 ENCOUNTER — Encounter: Payer: Self-pay | Admitting: Hematology and Oncology

## 2022-11-29 NOTE — Telephone Encounter (Signed)
Telephone call  

## 2022-11-29 NOTE — Telephone Encounter (Signed)
Copied from CRM [id number]. Topic: General - Inquiry >> Nov 28, 2022  3:52 PM Theodis Sato wrote: Reason for CRM: PT is requesting Dr. Louanne Skye to request MRI results from [facility] in [place] in order to expedite the process so that PT can go to see ENT specialist before the referral to ENT expires as per what PT was told by ENT.

## 2022-12-13 ENCOUNTER — Other Ambulatory Visit: Payer: Self-pay | Admitting: Family Medicine

## 2022-12-13 ENCOUNTER — Telehealth (INDEPENDENT_AMBULATORY_CARE_PROVIDER_SITE_OTHER): Payer: Self-pay

## 2022-12-13 ENCOUNTER — Telehealth: Payer: Self-pay | Admitting: *Deleted

## 2022-12-13 ENCOUNTER — Telehealth (INDEPENDENT_AMBULATORY_CARE_PROVIDER_SITE_OTHER): Payer: Self-pay | Admitting: Otolaryngology

## 2022-12-13 DIAGNOSIS — R220 Localized swelling, mass and lump, head: Secondary | ICD-10-CM

## 2022-12-13 NOTE — Telephone Encounter (Signed)
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

## 2022-12-13 NOTE — Telephone Encounter (Signed)
Spoke with patient and need to get patient in sooner because of his MRI results. Per doctor patel he needs a CT scan done before his appointment on the 10th of December. So it doesn't delay his treatment if need be. Gave patient the number for radiology. [phone]. He is going to call and if he has any question to give me a call back I can help him with scheduling

## 2022-12-13 NOTE — Telephone Encounter (Signed)
Received vm message from pt asking for a call back related to a scan he had done recently. TCT patient and spoke with him. 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  Tammy Sours wanted Dr. Derek Mound opinion of this. Tammy Sours is scheduled for another CT of soft tissue of neck on 12/17/22 and then he sees ENT, Dr. Allena Katz for a biopsy on 12/18/22 Advised that Dr. Leonides Schanz is out of the office until 12/19/22, but that I could get him a message. Advised that the will likely not get a call back from here until later next week.  Pt voiced understanding. Advised that getting the biopsy is what needs to happen at this point.  Pt agreed.

## 2022-12-17 ENCOUNTER — Ambulatory Visit (HOSPITAL_COMMUNITY): Payer: BC Managed Care – PPO

## 2022-12-17 ENCOUNTER — Telehealth (INDEPENDENT_AMBULATORY_CARE_PROVIDER_SITE_OTHER): Payer: Self-pay | Admitting: Otolaryngology

## 2022-12-17 NOTE — Telephone Encounter (Signed)
 Spoke with patient, confirmed appt time and location

## 2022-12-18 ENCOUNTER — Encounter (INDEPENDENT_AMBULATORY_CARE_PROVIDER_SITE_OTHER): Payer: Self-pay

## 2022-12-18 ENCOUNTER — Ambulatory Visit (INDEPENDENT_AMBULATORY_CARE_PROVIDER_SITE_OTHER): Payer: BC Managed Care – PPO | Admitting: Otolaryngology

## 2022-12-18 VITALS — Ht 68.0 in | Wt 187.0 lb

## 2022-12-18 DIAGNOSIS — Z8572 Personal history of non-Hodgkin lymphomas: Secondary | ICD-10-CM | POA: Diagnosis not present

## 2022-12-18 DIAGNOSIS — R220 Localized swelling, mass and lump, head: Secondary | ICD-10-CM

## 2022-12-18 DIAGNOSIS — C109 Malignant neoplasm of oropharynx, unspecified: Secondary | ICD-10-CM

## 2022-12-18 NOTE — Progress Notes (Signed)
Dear Dr. Louanne Clarke, Here is my assessment for our mutual patient, Shaun Clarke. Thank you for allowing me the opportunity to care for your patient. Please do not hesitate to contact me should you have any other questions. Sincerely, Dr. Jovita Clarke  Otolaryngology Clinic Note Referring provider: Dr. Louanne Clarke HPI:  Shaun Clarke is a 62 y.o. male kindly referred by Dr. Louanne Clarke for evaluation of palate mass.  Initial visit (12/18/2022): Noticed an area on his palate last April, showed to Oncologist, referred to Dentist, then referred to oral surgeon. Biopsy was done (Dr. Dia Clarke - June 2024), which showed inflamed salivary gland. It got slightly bigger, and then oral surgeon referred him to ENT and sent him for an MRI. He reports no numbness of his palate, no pain in his mouth. No pain in the ear. No trouble swallowing or VPI symptoms. No B symptoms  PMHx: DLBCL (diagnosed 2022, s/p treatment, in remission), GERD  H&N Surgery: denies Personal or FHx of bleeding dz or anesthesia difficulty: no   GLP-1: no AP/AC: no  Tobacco: 30 years (quit 2022). Alcohol: no significant use. Occupation: Product/process development scientist. Lives in [place], [place].  Independent Review of Additional Tests or Records:  Dr. Darrol Clarke notes (11/16/2022): Lesion on roof of mouth there for 6 months, saw oral surgeon, biopsied, sent back given growing lesion; ordered MRI and referred to ENT Dr. Leonides Clarke (07/25/2022): DLBCL, doing well. Counts stable, in remission MRI 11/28/2022: reviewed independently and agree with rad - left palate mass, with query extension into greater palatine foramen. No nasal lesion. 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 ;  Surgeon: Bethann Goo, DO;  Location: [facility] OR;  Service: Neurosurgery;  Laterality: N/A;   IR IMAGING GUIDED PORT INSERTION  04/29/2020   IR REMOVAL TUN ACCESS W/ PORT W/O FL MOD SED  10/24/2020   KNEE ARTHROSCOPY     right knee 3 times   SHOULDER BIOPSY     right shoulder lipoma removal    Family History  Problem Relation Age of Onset   Pancreatic cancer Mother    Diabetes Father    Anxiety disorder Sister    Multiple sclerosis Sister    COPD Maternal Grandfather      Social Connections: Not on file      Current Outpatient Medications:    methocarbamol (ROBAXIN) 500 MG tablet, Take 1 tablet (500 mg total) by mouth every 6 (six) hours as needed for muscle spasms., Disp: 90 tablet, Rfl: 3   pantoprazole (PROTONIX) 40 MG tablet, Take 1 tablet (40 mg total) by mouth daily., Disp: 90 tablet, Rfl: 3   Physical Exam:   Ht 5\' 8"  (1.727 m)   Wt 187 lb (84.8 kg)   BMI 28.43 kg/m   Salient findings:  CN II-XII intact  Bilateral EAC clear and TM intact with well pneumatized middle ear spaces Anterior rhinoscopy: Septum relatively midline; bilateral inferior turbinates without significant hypertrophy There is a left palate mass with mild overlying redness, which appears to be relatively well demarcated; not tender. There is no numbness of the palate, teeth, or gingiva or floor of nose (  see below). In addition, area around left GP foramen is also NOT numb; dentition fair No obviously palpable neck masses/lymphadenopathy/thyromegaly No respiratory distress or stridor Given location of mass, TFL was indicated for further evaluation of nasal cavity and any other masses and noted below  Seprately Identifiable Procedures:  Procedure Note Pre-procedure diagnosis: Palate mass, concern for carcinoma Post-procedure diagnosis: Same Procedure: Transnasal Fiberoptic  Laryngoscopy, CPT 13244 - Mod 25 Indication: see abov Complications: None apparent EBL: 0 mL Date: 12/21/22   The procedure was undertaken to further evaluate the patient's complaint of palate mass and concern for carcinoma, with mirror exam inadequate for appropriate examination due to gag reflex and poor patient tolerance  Procedure:  Patient was identified as correct patient. Verbal consent was obtained. The nose was sprayed with oxymetazoline and 4% lidocaine. The The flexible laryngoscope was passed through the nose to view the nasal cavity, pharynx (oropharynx, hypopharynx) and larynx.  The larynx was examined at rest and during multiple phonatory tasks. Documentation was obtained and reviewed with patient. The scope was removed. The patient tolerated the procedure well.  Findings: The nasal cavity and nasopharynx did not reveal any masses or lesions, mucosa appeared to be without obvious lesions. The tongue base, pharyngeal walls, piriform sinuses, vallecula, epiglottis and postcricoid region are normal in appearance - nasal floor is symmetric and I do not note any significant floor of nose mucosal changes or asymmetry. The visualized portion of the subglottis and proximal trachea is widely patent. The vocal folds are mobile bilaterally. There are no lesions on the free edge of the vocal folds nor elsewhere in the larynx worrisome for malignancy.    Electronically signed by: Read Drivers, MD 12/21/2022 12:25 PM   Impression & Plans:  Shaun Clarke is a 63 y.o. male with history of DLBCL in remission and now with:  1. Palate mass    MRI certainly seems concerning for malignancy. Do not think this is a lymph node. GP seems to be involved or at least concern for involvement given location. We discussed etiologies: could be lymphoma, but unlikely; does have history of smoking, so could be SCC but minor salivary etiology seems most likely.  We will obtain CT Neck to eval LN and bone  (though with adenoid cystic, may not get LN involvement). He also needs biopsies and will return in few days for those.  - f/u this week for biopsies - CT Neck with  See below regarding exact medications prescribed this encounter including dosages and route: No orders of the defined types were placed in this encounter.  Thank you for allowing me the opportunity to care for your patient. Please do not hesitate to contact me should you have any other questions.  Sincerely, Shaun Kussmaul, MD Otolarynoglogist (ENT), [facility] Phone: [phone] Fax: [phone]  12/21/2022, 12:25 PM   MDM:  Level 5 Complexity/Problems addressed: high - threat to bodily function Data complexity: high - independent review of MRI, notes, labs, ordering CT - Morbidity: high  - Prescription Drug prescribed or managed: no

## 2022-12-19 ENCOUNTER — Inpatient Hospital Stay
Admission: RE | Admit: 2022-12-19 | Discharge: 2022-12-19 | Discharge status: Home / Self Care | Payer: BC Managed Care – PPO | Source: Ambulatory Visit | Attending: Otolaryngology

## 2022-12-19 DIAGNOSIS — R220 Localized swelling, mass and lump, head: Secondary | ICD-10-CM

## 2022-12-19 MED ORDER — IOPAMIDOL (ISOVUE-300) INJECTION 61%
75.0000 mL | Freq: Once | INTRAVENOUS | Status: AC | PRN
Start: 2022-12-19 — End: 2022-12-19
  Administered 2022-12-19: 75 mL via INTRAVENOUS

## 2022-12-21 ENCOUNTER — Encounter (INDEPENDENT_AMBULATORY_CARE_PROVIDER_SITE_OTHER): Payer: Self-pay

## 2022-12-21 ENCOUNTER — Ambulatory Visit (INDEPENDENT_AMBULATORY_CARE_PROVIDER_SITE_OTHER): Payer: BC Managed Care – PPO | Admitting: Otolaryngology

## 2022-12-21 ENCOUNTER — Other Ambulatory Visit (HOSPITAL_COMMUNITY)
Admission: RE | Admit: 2022-12-21 | Discharge: 2022-12-21 | Disposition: A | Discharge status: Home / Self Care | Payer: BC Managed Care – PPO | Source: Ambulatory Visit | Attending: Otolaryngology | Admitting: Otolaryngology

## 2022-12-21 VITALS — Ht 68.0 in | Wt 180.0 lb

## 2022-12-21 DIAGNOSIS — R220 Localized swelling, mass and lump, head: Secondary | ICD-10-CM

## 2022-12-21 DIAGNOSIS — C8581 Other specified types of non-Hodgkin lymphoma, lymph nodes of head, face, and neck: Secondary | ICD-10-CM

## 2022-12-27 ENCOUNTER — Telehealth: Payer: Self-pay | Admitting: *Deleted

## 2022-12-27 ENCOUNTER — Other Ambulatory Visit: Payer: Self-pay | Admitting: Hematology and Oncology

## 2022-12-27 ENCOUNTER — Institutional Professional Consult (permissible substitution) (INDEPENDENT_AMBULATORY_CARE_PROVIDER_SITE_OTHER): Payer: BC Managed Care – PPO

## 2022-12-27 DIAGNOSIS — C83398 Diffuse large b-cell lymphoma of other extranodal and solid organ sites: Secondary | ICD-10-CM

## 2022-12-27 LAB — SURGICAL PATHOLOGY

## 2022-12-27 NOTE — Telephone Encounter (Signed)
TCT patient regarding recent biopsy results. He wanted to know if Dr. Leonides Schanz is aware of the results. Spoke with patient. Advised that Dr. Leonides Schanz has reviewed the results and states that there is concern for recurrent lymphoma based on the results. He will order a PET CT scan.  Once we get the results of the PET we will see him back ASAP . Pt voiced understanding.

## 2022-12-29 ENCOUNTER — Telehealth: Payer: Self-pay | Admitting: Hematology and Oncology

## 2022-12-29 ENCOUNTER — Telehealth (INDEPENDENT_AMBULATORY_CARE_PROVIDER_SITE_OTHER): Payer: Self-pay | Admitting: Otolaryngology

## 2022-12-29 NOTE — Progress Notes (Signed)
ENT procedure visit: Procedure Note - Punch Biopsy of left Palate  Name: Shaun Clarke MRN: [id number] Date: 12/21/2022   Pre-procedure diagnosis: Left palate Lesion, concern for malignancy Post-procedure diagnosis: Same Procedure: Biopsy of Left palate lesion CPT 42100 Complications: None apparent EBL: minimal Indication: Left palate Lesion, concern for malignancy; Pt initially seen on 12/18/2022, given history of lymphoma and appearance on MRI imaging, biopsy was indicated.   Procedure: Risks and benefits of biopsy were explained to the patient and consent was obtained prior to proceeding. Risks including bleeding, infection, fistula, lack of healing, discovery of malignancy, and pain were discussed. Despite risks, patient opted to proceed.  The patient was identified as the correct patient.  The area surrounding the lesion was injected 1% Lidocaine with 1:100,000 epinephrine and it was allowed to work.. A 3mm punch biopsy was used to make a circular incision at the periphery and center of lesion over left hard and soft palate. The tissue was carefully picked up with a forcep and cut free with sharp scissors. It was placed in formalin (for one specimen) and fresh (saline) for another specimen and passed off the field. Multiple biopsies were obtained. There was minimal bleeding that fully resolved prior to the end of the procedure with the following interventions: silver nitrate cautery and pressure. The patient tolerated the procedure well.   Post-procedure instructions: - Tylenol 1000mg  q6h PRN and Ibuprofen 400mg  q6h PRN for pain - Good oral care; alcohol free mouthwash BID x10d - will call with results    Electronically signed by: Read Drivers, MD 12/29/2022 8:26 PM

## 2022-12-29 NOTE — Telephone Encounter (Signed)
Telephone note: Discussed w/ patient regarding biopsy results. He reports he was having a fair amount of pain over past few days, but reports the wound is healing and has turned a corner. We discussed f/u and management, but at this point would defer any management to the oncology team. Discussed f/u as well, and he reports he will call us should he have any signs of infection or wound is not healing. Read Drivers

## 2023-01-01 ENCOUNTER — Encounter (HOSPITAL_COMMUNITY)
Admission: RE | Admit: 2023-01-01 | Discharge: 2023-01-01 | Disposition: A | Discharge status: Home / Self Care | Payer: BC Managed Care – PPO | Source: Ambulatory Visit | Attending: Hematology and Oncology | Admitting: Hematology and Oncology

## 2023-01-01 DIAGNOSIS — C83398 Diffuse large b-cell lymphoma of other extranodal and solid organ sites: Secondary | ICD-10-CM | POA: Insufficient documentation

## 2023-01-01 LAB — GLUCOSE, CAPILLARY: Glucose-Capillary: 98 mg/dL (ref 70–99)

## 2023-01-01 MED ORDER — FLUDEOXYGLUCOSE F - 18 (FDG) INJECTION
9.0000 | Freq: Once | INTRAVENOUS | Status: AC
  Administered 2023-01-01: 8.97 via INTRAVENOUS

## 2023-01-04 ENCOUNTER — Inpatient Hospital Stay (HOSPITAL_BASED_OUTPATIENT_CLINIC_OR_DEPARTMENT_OTHER): Payer: BC Managed Care – PPO | Admitting: Hematology and Oncology

## 2023-01-04 ENCOUNTER — Other Ambulatory Visit: Payer: Self-pay | Admitting: Hematology and Oncology

## 2023-01-04 ENCOUNTER — Inpatient Hospital Stay: Payer: BC Managed Care – PPO | Attending: Hematology and Oncology

## 2023-01-04 VITALS — BP 109/80 | HR 74 | Temp 97.8°F | Resp 14 | Ht 68.0 in | Wt 190.2 lb

## 2023-01-04 DIAGNOSIS — C8591 Non-Hodgkin lymphoma, unspecified, lymph nodes of head, face, and neck: Secondary | ICD-10-CM | POA: Insufficient documentation

## 2023-01-04 DIAGNOSIS — Z87891 Personal history of nicotine dependence: Secondary | ICD-10-CM | POA: Diagnosis not present

## 2023-01-04 DIAGNOSIS — C7951 Secondary malignant neoplasm of bone: Secondary | ICD-10-CM | POA: Insufficient documentation

## 2023-01-04 DIAGNOSIS — C83398 Diffuse large b-cell lymphoma of other extranodal and solid organ sites: Secondary | ICD-10-CM

## 2023-01-04 DIAGNOSIS — Z800 Family history of malignant neoplasm of digestive organs: Secondary | ICD-10-CM | POA: Insufficient documentation

## 2023-01-04 LAB — CMP (CANCER CENTER ONLY)
ALT: 32 U/L (ref 0–44)
AST: 18 U/L (ref 15–41)
Albumin: 4.3 g/dL (ref 3.5–5.0)
Alkaline Phosphatase: 65 U/L (ref 38–126)
Anion gap: 7 (ref 5–15)
BUN: 16 mg/dL (ref 8–23)
CO2: 23 mmol/L (ref 22–32)
Calcium: 9.3 mg/dL (ref 8.9–10.3)
Chloride: 111 mmol/L (ref 98–111)
Creatinine: 0.97 mg/dL (ref 0.61–1.24)
GFR, Estimated: >60 mL/min (ref >60)
Glucose, Bld: 91 mg/dL (ref 70–99)
Potassium: 4.1 mmol/L (ref 3.5–5.1)
Sodium: 141 mmol/L (ref 135–145)
Total Bilirubin: 0.3 mg/dL (ref <1.2)
Total Protein: 7.2 g/dL (ref 6.5–8.1)

## 2023-01-04 LAB — CBC WITH DIFFERENTIAL (CANCER CENTER ONLY)
Abs Immature Granulocytes: 0.01 10*3/uL (ref 0.00–0.07)
Basophils Absolute: 0.1 10*3/uL (ref 0.0–0.1)
Basophils Relative: 1 %
Eosinophils Absolute: 0.1 10*3/uL (ref 0.0–0.5)
Eosinophils Relative: 3 %
HCT: 46 % (ref 39.0–52.0)
Hemoglobin: 15.5 g/dL (ref 13.0–17.0)
Immature Granulocytes: 0 %
Lymphocytes Relative: 26 %
Lymphs Abs: 1.3 10*3/uL (ref 0.7–4.0)
MCH: 28.3 pg (ref 26.0–34.0)
MCHC: 33.7 g/dL (ref 30.0–36.0)
MCV: 83.9 fL (ref 80.0–100.0)
Monocytes Absolute: 0.5 10*3/uL (ref 0.1–1.0)
Monocytes Relative: 11 %
Neutro Abs: 2.9 10*3/uL (ref 1.7–7.7)
Neutrophils Relative %: 59 %
Platelet Count: 185 10*3/uL (ref 150–400)
RBC: 5.48 MIL/uL (ref 4.22–5.81)
RDW: 15.1 % (ref 11.5–15.5)
WBC Count: 4.8 10*3/uL (ref 4.0–10.5)
nRBC: 0 % (ref 0.0–0.2)

## 2023-01-04 LAB — LACTATE DEHYDROGENASE: LDH: 127 U/L (ref 98–192)

## 2023-01-04 NOTE — Progress Notes (Signed)
[facility] [phone]   [phone]  PROGRESS NOTE  Patient Care Team: Dettinger, Elige Radon, MD as PCP - General (Family Medicine)  Hematological/Oncological History # Diffuse Large B Cell Lymphoma, Staging IV  04/07/2020: presented to the ED with MRI findings concerning for extensive osseous metastatic disease including large volume paraspinal and epidural tumor in the mid and lower thoracic spine  04/08/2020: establish care with Dr. Leonides Schanz while inpatient. Scheduled for laminectomy/biopsy of metastatic spine lesion.   04/08/2020: laminectomy performed, biopsy results show DLBCL.   04/26/2020: PET CT scan shows widespread skeletal involvement of the malignancy with numerous Deauville 5 score osseous lesions.  Hypermetabolic lymph nodes in the neck, chest, abdomen and pelvis. 05/04/2020: Cycle 1 Day 1 of R-CHOP.  05/23/2020: Cycle 2 Day 1 of R-CHOP 06/15/2020: Cycle 3 Day 1 of R-CHOP 07/04/2020: PET CT scan shows a complete metabolic response to therapy with only mild residual Deauville score 2 lymph nodes  07/06/2020: Cycle 4 Day 1 of R-CHOP 07/27/2020: Cycle 5 Day 1 of R-CHOP 08/17/2020: Cycle 6 Day 1 of R-CHOP 09/19/2020: post treatment PET CT scan shows no evidence of progressive disease. Deauville 2. 03/24/2021: CT scan shows no evidence of recurrent disease. 10/13/2021: CT scan shows no evidence of recurrent disease. 01/30/2022: CT scan shows no evidence of recurrent disease.  Interval History:  Shaun Clarke 62 y.o. 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 The patient's last visit was on 07/25/2022.  In the interim since the last visit he underwent a biopsy of the hard palate lesion on 12/21/2022.  Pathology was consistent with a non-Hodgkin's lymphoma.  Additionally a PET CT scan was subsequently performed which showed new hypermetabolic mass in the hard palate and new subcentimeter  hypermetabolic left upper internal jugular chain lymph nodes.  Differential was consistent with a recurrent lymphoma (Deauville score 5).  On exam today Shaun Clarke is accompanied by his wife.  He notes that he is deeply saddened by the results of his biopsy and PET scan confirming recurrence of his non-Hodgkin's lymphoma.  He reports that he is now beginning to eat well after having "a large hole in my mouth".  He notes he was previously only eating soft foods but is now eating a normal diet.  He notes otherwise he is felt quite well with good energy and strong appetite.  Overall he is at his baseline level of health.  He denies any fevers, chills, nausea, vomiting or diarrhea.  A full 10 point ROS is listed below.  The bulk of our discussion focused on the results of his pathology and PET CT scan.  At this time findings do appear most consistent with a recurrence of his lymphoma.  Pathology did review the sample and found it to be consistent, however it is possible that this may represent a secondary lymphoma (the Ki-67 was lessened but the other markers were consistent with his prior diffuse large B-cell lymphoma).  Given that this is likely a recurrent diffuse large B-cell lymphoma we made a referral to [facility] for consideration of transplant versus CAR-T or by specific therapy.  I do think he would likely be a strong candidate for transplant, but other tertiary care options could be considered.  The patient voices understanding of our findings and plan moving forward.  Of note the patient does express his frustration that the lesion on his hard palate took so long to  diagnose.  He was originally seen by his dentist (at my request) in April 2024 when the lesion was small and painless.  Then was referred to an oral surgeon.  His initial biopsy was consistent with salivary gland inflammation in June 2024.  When this increased in size the oral surgeon referred him to ENT and MRI  was performed.  Subsequently Dr. Allena Katz in ENT was able to perform the definitive biopsy on 12/21/2022.  MEDICAL HISTORY:  Past Medical History:  Diagnosis Date   GERD (gastroesophageal reflux disease)     SURGICAL HISTORY: Past Surgical History:  Procedure Laterality Date   APPENDECTOMY     DECOMPRESSIVE LUMBAR LAMINECTOMY LEVEL 2 N/A 04/08/2020   Procedure: THORACIC FIVE-THORACIC SEVEN OPEN LAMINECTOMY FOR RESECTION OF EPIDURAL TUMOR ;  Surgeon: Bethann Goo, DO;  Location: [facility] OR;  Service: Neurosurgery;  Laterality: N/A;   IR IMAGING GUIDED PORT INSERTION  04/29/2020   IR REMOVAL TUN ACCESS W/ PORT W/O FL MOD SED  10/24/2020   KNEE ARTHROSCOPY     right knee 3 times   SHOULDER BIOPSY     right shoulder lipoma removal    SOCIAL HISTORY: Social History   Socioeconomic History   Marital status: Married    Spouse name: Not on file   Number of children: 2   Years of education: Not on file   Highest education level: Not on file  Occupational History   Not on file  Tobacco Use   Smoking status: Former    Current packs/day: 1.00    Types: Cigarettes   Smokeless tobacco: Never  Vaping Use   Vaping status: Never Used  Substance and Sexual Activity   Alcohol use: Yes    Alcohol/week: 3.0 standard drinks of alcohol    Types: 1 Glasses of wine, 2 Cans of beer per week    Comment: per week   Drug use: Never   Sexual activity: Yes    Partners: Female  Other Topics Concern   Not on file  Social History Narrative   Not on file   Social Drivers of Health   Financial Resource Strain: Not on file  Food Insecurity: Not on file  Transportation Needs: Not on file  Physical Activity: Not on file  Stress: Not on file  Social Connections: Not on file  Intimate Partner Violence: Not on file    FAMILY HISTORY: Family History  Problem Relation Age of Onset   Pancreatic cancer Mother    Diabetes Father    Anxiety disorder Sister    Multiple sclerosis Sister    COPD Maternal  Grandfather     ALLERGIES:  is allergic to naproxen and gadolinium derivatives.  MEDICATIONS:  Current Outpatient Medications  Medication Sig Dispense Refill   methocarbamol (ROBAXIN) 500 MG tablet Take 1 tablet (500 mg total) by mouth every 6 (six) hours as needed for muscle spasms. 90 tablet 3   pantoprazole (PROTONIX) 40 MG tablet Take 1 tablet (40 mg total) by mouth daily. 90 tablet 3   No current facility-administered medications for this visit.    REVIEW OF SYSTEMS:   Constitutional: ( - ) fevers, ( - )  chills , ( - ) night sweats Eyes: ( - ) blurriness of vision, ( - ) double vision, ( - ) watery eyes Ears, nose, mouth, throat, and face: ( - ) mucositis, ( - ) sore throat Respiratory: ( - ) cough, ( - ) dyspnea, ( - ) wheezes Cardiovascular: ( - ) palpitation, ( - )  chest discomfort, ( - ) lower extremity swelling Gastrointestinal:  ( - ) nausea, ( - ) heartburn, ( - ) change in bowel habits Skin: ( - ) abnormal skin rashes Lymphatics: ( - ) new lymphadenopathy, ( - ) easy bruising Neurological: ( - ) numbness, ( - ) tingling, ( - ) new weaknesses Behavioral/Psych: ( - ) mood change, ( - ) new changes  All other systems were reviewed with the patient and are negative.  PHYSICAL EXAMINATION: ECOG PERFORMANCE STATUS: 1 - Symptomatic but completely ambulatory  Vitals:   01/04/23 1501  BP: 109/80  Pulse: 74  Resp: 14  Temp: 97.8 F (36.6 C)  SpO2: 99%         Filed Weights   01/04/23 1501  Weight: 190 lb 3.2 oz (86.3 kg)       GENERAL: well appearing middle aged Caucasian male alert, no distress and comfortable SKIN: skin color, texture, turgor are normal, no rashes or significant lesions LYMPH: No enlarged cervical or supraclavicular lymph nodes. EYES: conjunctiva are pink and non-injected, sclera clear LUNGS: clear to auscultation and percussion with normal breathing effort HEART: regular rate & rhythm and no murmurs and no lower extremity  edema Musculoskeletal: no cyanosis of digits and no clubbing  PSYCH: alert & oriented x 3, fluent speech NEURO: no focal motor/sensory deficits  LABORATORY DATA:  I have reviewed the data as listed    Latest Ref Rng & Units 01/04/2023    2:42 PM 07/25/2022    2:23 PM 04/25/2022    2:23 PM  CBC  WBC 4.0 - 10.5 K/uL 4.8  5.6  5.2   Hemoglobin 13.0 - 17.0 g/dL 14.7  82.9  56.2   Hematocrit 39.0 - 52.0 % 46.0  45.3  44.7   Platelets 150 - 400 K/uL 185  246  238        Latest Ref Rng & Units 01/04/2023    2:42 PM 07/25/2022    2:23 PM 04/25/2022    2:23 PM  CMP  Glucose 70 - 99 mg/dL 91  75  130   BUN 8 - 23 mg/dL 16  20  16    Creatinine 0.61 - 1.24 mg/dL 8.65  7.84  6.96   Sodium 135 - 145 mmol/L 141  142  139   Potassium 3.5 - 5.1 mmol/L 4.1  4.5  4.2   Chloride 98 - 111 mmol/L 111  108  107   CO2 22 - 32 mmol/L 23  27  26    Calcium 8.9 - 10.3 mg/dL 9.3  9.9  9.5   Total Protein 6.5 - 8.1 g/dL 7.2  7.1  6.9   Total Bilirubin <1.2 mg/dL 0.3  0.5  0.6   Alkaline Phos 38 - 126 U/L 65  63  57   AST 15 - 41 U/L 18  16  14    ALT 0 - 44 U/L 32  17  13     Lab Results  Component Value Date   MPROTEIN Not Observed 04/07/2020   Lab Results  Component Value Date   KPAFRELGTCHN 11.4 04/07/2020   LAMBDASER 9.0 04/07/2020   KAPLAMBRATIO 1.27 04/07/2020     RADIOGRAPHIC STUDIES: NM PET Image Restag (PS) Skull Base To Thigh Result Date: 01/04/2023 CLINICAL DATA:  Subsequent treatment strategy for diffuse large B-cell lymphoma. EXAM: NUCLEAR MEDICINE PET SKULL BASE TO THIGH TECHNIQUE: 9.0 mCi F-18 FDG was injected intravenously. Full-ring PET imaging was performed from the skull base to thigh after  the radiotracer. CT data was obtained and used for attenuation correction and anatomic localization. Fasting blood glucose: 98 mg/dl COMPARISON:  CAP CTs on 08/01/2022 and 07/23/2022, and prior PET-CT on 09/19/2020 FINDINGS: Mediastinal blood-pool activity (background): SUV max = 2.5 Liver  activity (reference): SUV max = 3.9 NECK: New hypermetabolic mass is seen in hard palate with SUV max of 15.1. Two new 8 mm left upper internal jugular chain lymph nodes are seen in levels 2A and 2B, which are hypermetabolic with SUV max of 9.3 and 11.6 respectively. Incidental CT findings:  None. CHEST: No hypermetabolic lymph nodes. No suspicious pulmonary nodules seen on CT images. Incidental CT findings: Mild bilateral lower lobe scarring remains stable. ABDOMEN/PELVIS: No abnormal hypermetabolic activity within the liver, pancreas, adrenal glands, or spleen. No hypermetabolic lymph nodes in the abdomen or pelvis. Incidental CT findings: Diverticulosis is seen mainly involving the sigmoid colon, however there is no evidence of diverticulitis. SKELETON: No focal hypermetabolic bone lesions to suggest skeletal metastasis. Incidental CT findings:  None. IMPRESSION: New hypermetabolic mass in the hard palate, and new sub-centimeter hypermetabolic left upper internal jugular chain lymph nodes. Differential diagnosis includes recurrent lymphoma (Deauville score 5), and head and neck carcinoma. No evidence of malignancy in the chest, abdomen, or pelvis. Colonic diverticulosis, without radiographic evidence of diverticulitis. Electronically Signed   By: Danae Orleans M.D.   On: 01/04/2023 14:34   CT Soft Tissue Neck W Contrast Result Date: 12/31/2022 CLINICAL DATA:  Head and neck cancer, staging.  Hard palate mass. EXAM: CT NECK WITH CONTRAST TECHNIQUE: Multidetector CT imaging of the neck was performed using the standard protocol following the bolus administration of intravenous contrast. RADIATION DOSE REDUCTION: This exam was performed according to the departmental dose-optimization program which includes automated exposure control, adjustment of the mA and/or kV according to patient size and/or use of iterative reconstruction technique. CONTRAST:  75mL ISOVUE-300 IOPAMIDOL (ISOVUE-300) INJECTION 61% COMPARISON:   MRI 11/28/2022 FINDINGS: Pharynx and larynx: Better shown by MRI is a soft tissue mass of the left hard palate region extending front to back 2.4 cm with a transverse diameter of approximately 1.5 cm. Bone erosion of the medial cortex of the medial left alveolar ridge. Enlargement of the left greater palatine foramen consistent with perineural extension. Salivary glands: Parotid and submandibular glands are normal. Thyroid: Normal Lymph nodes: No enlarged or abnormal density lymph nodes identified on either side of the neck. Vascular: Normal Limited intracranial: Normal Visualized orbits: Normal Mastoids and visualized paranasal sinuses: Clear Skeleton: As noted above, there appears to be cortical erosion of the left hard palate and medial cortex of the alveolar ridge of the left maxilla. Enlargement of the left greater palatine foramen probably indicating perineural extension. Upper chest: No evidence of metastatic disease to the upper chest. No pleural effusion. Other: None IMPRESSION: 1. Better shown by MRI is a soft tissue mass of the left hard palate region extending front to back 2.4 cm with a transverse diameter of approximately 1.5 cm. Bone erosion of the medial cortex of the medial left alveolar ridge of the maxilla. Enlargement of the left greater palatine foramen consistent with perineural extension. 2. No evidence of metastatic disease to the nodes of the neck or upper chest. Electronically Signed   By: Paulina Fusi M.D.   On: 12/31/2022 16:58    ASSESSMENT & PLAN Shaun Clarke 62 y.o. 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  After review  the labs, the records, schedule the patient the findings most consistent with a diffuse large B-cell lymphoma with staging in process.  We completed the staging with a PET CT scan which showed his disease does appear as higher stage (III or IV). At this time we will plan for at least 6 cycles  of R-CHOP followed by repeat scans.    Previously we discussed the risks and benefits of R-CHOP chemotherapy.  We noted the nature of diffuse large B-cell lymphoma and the possibility of achieving a complete remission with this chemotherapy regimen.  I also noted the side effects which include but are not limited to nausea, vomiting, hair loss, diarrhea, sedation, insomnia, hyperglycemia, high blood pressure, and cardiotoxicity.  The patient voices understanding of the risk and benefits and was agreeable to proceeding with treatment at this time.   The R-CHOP regimen consists of rituximab 375 mg per metered squared IV on day 1, cyclophosphamide 750 mg per metered squared on day 1, doxorubicin 50 mg/m on day 1, vincristine 1.4 mg per metered squared with a max dose of 2 mg on day 1, and prednisone 100 mg orally days 1 through 5.  A cycle lasts for 21 days and number of cycles planned will depend on the stage as determined by the PET CT scan.  IPI Prognostic Index: 2 points Good Prognosis (80% progression free survival)  # Diffuse Large B Cell Lymphoma, Stage IV-In remission --Findings are most consistent with a diffuse large B-cell lymphoma metastatic to the spine.  Given his metastatic disease in the spine he is at least a stage III or IV. -- PET CT scan on 07/04/2020 showed complete metabolic response.  Similar results on posttreatment PET CT scan in September 2020 --FISH testing negative for double HIT, we proceeded with R-CHOP chemotherapy as noted above.  --Cycle 1 Day 1 started on 05/04/2020 --Post treatment PET CT scan on 09/08/2020 showed no evidence of disease with Deauville 2 Plan: --labs today show white blood cell count 4.8, Hgb 15.5, MCV 83.9, Plt 185.  --biopsy of hard pallate shows atypical lymphoid infiltrate consistent with non-Hodgkin B-cell  Lymphoma. PET CT scan on 01/01/2023 showed new hypermetabolic mass in the hard palate, and new sub-centimeter hypermetabolic left upper internal  jugular chain lymph nodes. --pathology/ PET CT results do appear consistent with lymphoma recurrence. This is 2 years out from initial remission and Ki67 is lower % than prior, pathology favors recurrence of initial process.  --per NCCN can proceed to transplant or consider CAR-T therapy. Will make referral to [facility] for consideration of 2nd line treatment for relapse >12 months from initial remission.  --Placeholder visit in 3 months time but will schedule sooner if additional support/therapy is required her based on tertiary center's recommendations.    No orders of the defined types were placed in this encounter.  All questions were answered. The patient knows to call the clinic with any problems, questions or concerns.  A total of more than 30 minutes were spent on this encounter and over half of that time was spent on counseling and coordination of care as outlined above.   Shaun Barns, MD Department of Hematology/Oncology [facility] Phone: [phone] Pager: [phone] Email: [email or web address]  01/04/2023 4:17 PM

## 2023-01-15 DIAGNOSIS — C83398 Diffuse large b-cell lymphoma of other extranodal and solid organ sites: Secondary | ICD-10-CM | POA: Diagnosis not present

## 2023-01-21 ENCOUNTER — Encounter: Payer: Self-pay | Admitting: Family Medicine

## 2023-01-21 ENCOUNTER — Ambulatory Visit: Payer: BC Managed Care – PPO | Admitting: Hematology and Oncology

## 2023-01-21 ENCOUNTER — Other Ambulatory Visit: Payer: BC Managed Care – PPO

## 2023-01-21 DIAGNOSIS — Z0000 Encounter for general adult medical examination without abnormal findings: Secondary | ICD-10-CM

## 2023-01-21 MED ORDER — METHOCARBAMOL 500 MG PO TABS: 500.0000 mg | ORAL_TABLET | Freq: Four times a day (QID) | ORAL | 3 refills | Status: AC | PRN

## 2023-01-23 DIAGNOSIS — D47Z9 Other specified neoplasms of uncertain behavior of lymphoid, hematopoietic and related tissue: Secondary | ICD-10-CM | POA: Diagnosis not present

## 2023-01-23 DIAGNOSIS — C8590 Non-Hodgkin lymphoma, unspecified, unspecified site: Secondary | ICD-10-CM | POA: Diagnosis not present

## 2023-01-29 DIAGNOSIS — K1379 Other lesions of oral mucosa: Secondary | ICD-10-CM | POA: Diagnosis not present

## 2023-01-29 DIAGNOSIS — C8209 Follicular lymphoma grade I, extranodal and solid organ sites: Secondary | ICD-10-CM | POA: Diagnosis not present

## 2023-01-29 DIAGNOSIS — R590 Localized enlarged lymph nodes: Secondary | ICD-10-CM | POA: Diagnosis not present

## 2023-01-29 DIAGNOSIS — C83398 Diffuse large b-cell lymphoma of other extranodal and solid organ sites: Secondary | ICD-10-CM | POA: Diagnosis not present

## 2023-01-29 DIAGNOSIS — Z9221 Personal history of antineoplastic chemotherapy: Secondary | ICD-10-CM | POA: Diagnosis not present

## 2023-01-29 DIAGNOSIS — R21 Rash and other nonspecific skin eruption: Secondary | ICD-10-CM | POA: Diagnosis not present

## 2023-01-29 DIAGNOSIS — Z9889 Other specified postprocedural states: Secondary | ICD-10-CM | POA: Diagnosis not present

## 2023-01-30 ENCOUNTER — Other Ambulatory Visit: Payer: Self-pay

## 2023-01-30 DIAGNOSIS — K219 Gastro-esophageal reflux disease without esophagitis: Secondary | ICD-10-CM

## 2023-01-30 MED ORDER — PANTOPRAZOLE SODIUM 40 MG PO TBEC: 40.0000 mg | DELAYED_RELEASE_TABLET | Freq: Every day | ORAL | 3 refills | Status: DC

## 2023-02-07 DIAGNOSIS — R590 Localized enlarged lymph nodes: Secondary | ICD-10-CM | POA: Diagnosis not present

## 2023-02-07 DIAGNOSIS — K1379 Other lesions of oral mucosa: Secondary | ICD-10-CM | POA: Diagnosis not present

## 2023-02-07 DIAGNOSIS — C83398 Diffuse large b-cell lymphoma of other extranodal and solid organ sites: Secondary | ICD-10-CM | POA: Diagnosis not present

## 2023-02-07 DIAGNOSIS — R21 Rash and other nonspecific skin eruption: Secondary | ICD-10-CM | POA: Diagnosis not present

## 2023-02-07 DIAGNOSIS — Z9221 Personal history of antineoplastic chemotherapy: Secondary | ICD-10-CM | POA: Diagnosis not present

## 2023-02-07 DIAGNOSIS — Z9889 Other specified postprocedural states: Secondary | ICD-10-CM | POA: Diagnosis not present

## 2023-02-14 DIAGNOSIS — Z510 Encounter for antineoplastic radiation therapy: Secondary | ICD-10-CM | POA: Diagnosis not present

## 2023-02-14 DIAGNOSIS — C83398 Diffuse large b-cell lymphoma of other extranodal and solid organ sites: Secondary | ICD-10-CM | POA: Diagnosis not present

## 2023-02-21 DIAGNOSIS — Z510 Encounter for antineoplastic radiation therapy: Secondary | ICD-10-CM | POA: Diagnosis not present

## 2023-02-21 DIAGNOSIS — C83398 Diffuse large b-cell lymphoma of other extranodal and solid organ sites: Secondary | ICD-10-CM | POA: Diagnosis not present

## 2023-02-25 DIAGNOSIS — Z510 Encounter for antineoplastic radiation therapy: Secondary | ICD-10-CM | POA: Diagnosis not present

## 2023-02-25 DIAGNOSIS — C83398 Diffuse large b-cell lymphoma of other extranodal and solid organ sites: Secondary | ICD-10-CM | POA: Diagnosis not present

## 2023-02-26 DIAGNOSIS — Z510 Encounter for antineoplastic radiation therapy: Secondary | ICD-10-CM | POA: Diagnosis not present

## 2023-02-26 DIAGNOSIS — C83398 Diffuse large b-cell lymphoma of other extranodal and solid organ sites: Secondary | ICD-10-CM | POA: Diagnosis not present

## 2023-02-28 ENCOUNTER — Other Ambulatory Visit: Payer: Self-pay | Admitting: Family Medicine

## 2023-02-28 DIAGNOSIS — K219 Gastro-esophageal reflux disease without esophagitis: Secondary | ICD-10-CM

## 2023-02-28 NOTE — Telephone Encounter (Signed)
lansoprazole (PREVACID) 30 MG capsule        Changed from: pantoprazole (PROTONIX) 40 MG tablet   Pharmacy comment: Alternative Requested:PRIOR AUTHORIZATION.   All Pharmacy Suggested Alternatives:  lansoprazole (PREVACID) 30 MG capsule omeprazole (PRILOSEC) 20 MG capsule esomeprazole (NEXIUM) 20 MG capsule RABEprazole (ACIPHEX) 20 MG tablet

## 2023-03-01 NOTE — Telephone Encounter (Signed)
I do not understand is the same and that the Prevacid or lansoprazole is not covered or that the Protonix was not covered?  Because in the alternatives listed below it says that the Prevacid should be covered?

## 2023-03-08 ENCOUNTER — Encounter: Payer: Self-pay | Admitting: Hematology and Oncology

## 2023-03-08 ENCOUNTER — Other Ambulatory Visit (HOSPITAL_COMMUNITY): Payer: Self-pay

## 2023-03-08 ENCOUNTER — Telehealth: Payer: Self-pay

## 2023-03-08 NOTE — Telephone Encounter (Deleted)
 PA request has been Cancelled. New Encounter created for follow up. For additional info see Pharmacy Prior Auth telephone encounter from 03/08/23.

## 2023-03-08 NOTE — Telephone Encounter (Signed)
 Pharmacy Patient Advocate Encounter   Received notification from RX Request Messages that prior authorization for Pantoprazole Sodium 40MG  dr tablets is required/requested.   Insurance verification completed.   The patient is insured through [facility] .   Per test claim: PA required and submitted KEY/EOC/Request #: [id number] CANCELLED due to: Your PA has been resolved, no additional PA is required.  Per test claim, refill too soon. Last filled 03/05/23. Insurance allows MAX QTY OF 90.000 IN 365 DAYS-QUANTITY REMAINING 30.000. After the remaining 30 tablets has been filled, we can resubmit for PA.

## 2023-03-08 NOTE — Telephone Encounter (Signed)
 PA has been submitted and documented in separate encounter, please sign off on rx in this encounter as PA team is unable to resolve RX requests. Thank you

## 2023-03-08 NOTE — Telephone Encounter (Signed)
 Pharmacy Patient Advocate Encounter  Insurance verification completed.   The patient is insured through [employer] test claim for LANSOPRAZOLE 30MG . Currently a quantity of 30 is a 30 day supply and the co-pay is 4.94 .  This test claim was processed through [facility]- copay amounts may vary at other pharmacies due to pharmacy/plan contracts, or as the patient moves through the different stages of their insurance plan.

## 2023-03-08 NOTE — Telephone Encounter (Signed)
 Okay that is fine, after the 30 remaining that he gets then let him know that we will resubmit for a prior authorization so after he uses this 30 go to the pharmacy and request it again so that we will get the prior authorization from the pharmacy.

## 2023-03-08 NOTE — Telephone Encounter (Signed)
Left message making pt aware and to call back if needed.

## 2023-03-11 DIAGNOSIS — D479 Neoplasm of uncertain behavior of lymphoid, hematopoietic and related tissue, unspecified: Secondary | ICD-10-CM | POA: Diagnosis not present

## 2023-03-11 DIAGNOSIS — K219 Gastro-esophageal reflux disease without esophagitis: Secondary | ICD-10-CM | POA: Diagnosis not present

## 2023-03-11 DIAGNOSIS — C8330 Diffuse large B-cell lymphoma, unspecified site: Secondary | ICD-10-CM | POA: Diagnosis not present

## 2023-03-11 DIAGNOSIS — Z833 Family history of diabetes mellitus: Secondary | ICD-10-CM | POA: Diagnosis not present

## 2023-03-11 DIAGNOSIS — M62838 Other muscle spasm: Secondary | ICD-10-CM | POA: Diagnosis not present

## 2023-03-11 DIAGNOSIS — Z809 Family history of malignant neoplasm, unspecified: Secondary | ICD-10-CM | POA: Diagnosis not present

## 2023-03-11 DIAGNOSIS — Z886 Allergy status to analgesic agent status: Secondary | ICD-10-CM | POA: Diagnosis not present

## 2023-03-11 DIAGNOSIS — M792 Neuralgia and neuritis, unspecified: Secondary | ICD-10-CM | POA: Diagnosis not present

## 2023-03-11 DIAGNOSIS — C8290 Follicular lymphoma, unspecified, unspecified site: Secondary | ICD-10-CM | POA: Diagnosis not present

## 2023-03-11 DIAGNOSIS — Z87891 Personal history of nicotine dependence: Secondary | ICD-10-CM | POA: Diagnosis not present

## 2023-04-04 DIAGNOSIS — C83398 Diffuse large b-cell lymphoma of other extranodal and solid organ sites: Secondary | ICD-10-CM | POA: Diagnosis not present

## 2023-04-29 ENCOUNTER — Other Ambulatory Visit: Payer: Self-pay | Admitting: Family Medicine

## 2023-04-29 DIAGNOSIS — K219 Gastro-esophageal reflux disease without esophagitis: Secondary | ICD-10-CM

## 2023-04-30 NOTE — Telephone Encounter (Signed)
  lansoprazole (PREVACID) 30 MG capsule        Changed from: pantoprazole  (PROTONIX ) 40 MG tablet   Pharmacy comment: Alternative Requested:PRIOR AUTHORIZATION FOR MORE THAN 90 TABLETS PER YEAR.   All Pharmacy Suggested Alternatives:  lansoprazole (PREVACID) 30 MG capsule omeprazole (PRILOSEC) 20 MG capsule esomeprazole (NEXIUM) 20 MG capsule RABEprazole (ACIPHEX) 20 MG tablet  To prescribe one of the alternatives listed above,

## 2023-05-01 ENCOUNTER — Telehealth: Payer: Self-pay

## 2023-05-01 ENCOUNTER — Other Ambulatory Visit (HOSPITAL_COMMUNITY): Payer: Self-pay

## 2023-05-01 ENCOUNTER — Telehealth: Payer: Self-pay | Admitting: Family Medicine

## 2023-05-01 DIAGNOSIS — K219 Gastro-esophageal reflux disease without esophagitis: Secondary | ICD-10-CM

## 2023-05-01 NOTE — Telephone Encounter (Signed)
 Pharmacy Patient Advocate Encounter  Received notification from [facility] that Prior Authorization for Pantoprazole  Sodium 40MG  dr tablets has been APPROVED from 05/01/2023 to 05/01/2023   PA #/Case ID/Reference #:  [id number]  TEST CLAIM

## 2023-05-01 NOTE — Telephone Encounter (Signed)
 Pharmacy Patient Advocate Encounter   Received notification from CoverMyMeds that prior authorization for Pantoprazole  Sodium 40MG  dr tablets is required/requested.   Insurance verification completed.   The patient is insured through [facility] .   Per test claim: PA required; PA started via CoverMyMeds. KEY [id number] . Waiting for clinical questions to populate.

## 2023-05-01 NOTE — Telephone Encounter (Signed)
 As far as my records show, we prescribed lansoprazole, as we have only prescribed pantoprazole  which is what we prescribed most recently.  He should be taking pantoprazole 

## 2023-05-01 NOTE — Telephone Encounter (Signed)
 lansoprazole (PREVACID) 30 MG capsule        Changed from: pantoprazole  (PROTONIX ) 40 MG tablet    Pharmacy comment: Alternative Requested:PRIOR AUTHORIZATION REQUIRED.   All Pharmacy Suggested Alternatives:  lansoprazole (PREVACID) 30 MG capsule omeprazole (PRILOSEC) 20 MG capsule esomeprazole (NEXIUM) 20 MG capsule RABEprazole (ACIPHEX) 20 MG tablet

## 2023-05-29 DIAGNOSIS — C8331 Diffuse large B-cell lymphoma, lymph nodes of head, face, and neck: Secondary | ICD-10-CM | POA: Diagnosis not present

## 2023-05-29 DIAGNOSIS — D47Z9 Other specified neoplasms of uncertain behavior of lymphoid, hematopoietic and related tissue: Secondary | ICD-10-CM | POA: Diagnosis not present

## 2023-08-14 ENCOUNTER — Encounter: Payer: Self-pay | Admitting: Family Medicine

## 2023-08-14 ENCOUNTER — Ambulatory Visit: Admitting: Family Medicine

## 2023-08-14 ENCOUNTER — Encounter: Payer: Self-pay | Admitting: Hematology and Oncology

## 2023-08-14 VITALS — BP 118/80 | HR 71 | Temp 98.0°F | Ht 68.0 in | Wt 181.4 lb

## 2023-08-14 DIAGNOSIS — K5792 Diverticulitis of intestine, part unspecified, without perforation or abscess without bleeding: Secondary | ICD-10-CM | POA: Diagnosis not present

## 2023-08-14 DIAGNOSIS — K625 Hemorrhage of anus and rectum: Secondary | ICD-10-CM | POA: Diagnosis not present

## 2023-08-14 MED ORDER — AMOXICILLIN-POT CLAVULANATE 875-125 MG PO TABS: 1.0000 | ORAL_TABLET | Freq: Two times a day (BID) | ORAL | 0 refills | Status: DC

## 2023-08-14 NOTE — Progress Notes (Signed)
 BP 118/80   Pulse 71   Temp 98 F (36.7 C) (Oral)   Ht 5' 8 (1.727 m)   Wt 181 lb 6.4 oz (82.3 kg)   SpO2 94%   BMI 27.58 kg/m    Subjective:   Patient ID: Shaun Clarke, male    DOB: 1960/09/22, 63 y.o.   MRN: [id number]  HPI: Shaun Clarke is a 63 y.o. male presenting on 08/14/2023 for Abdominal Pain and Rectal Bleeding   Discussed the use of AI scribe software for clinical note transcription with the patient, who gave verbal consent to proceed.  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  Shaun Clarke has experienced rectal bleeding and abdominal pain for about a week and a half. The pain is localized to the lower abdomen and is associated with rectal cramps, sometimes alleviated by passing gas or having a bowel movement. Initially, the pain lasted for about a day and subsided after Shaun Clarke refrained from eating solid food for 24 hours, consuming only liquids. The symptoms recurred yesterday with cramping, and this morning Shaun Clarke noticed blood after attempting a bowel movement, although subsequent stools were clear of blood.  Shaun Clarke has regular bowel movements without the need to strain and has not had previous episodes of blood in his stool until today. No fevers, chills, or urinary symptoms are present. Shaun Clarke recalls a similar episode about ten years ago, which was more intense and treated with antibiotics for a presumed colitis or diverticulitis.  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 A full body PET scan in May 2025 was reported to be clear according to the patient. Shaun Clarke also has a history of diverticulosis, identified during a colonoscopy three years ago, which was otherwise clear.  Shaun Clarke has been cautious with his diet, avoiding salads and tomatoes  since the onset of symptoms, and has not identified any specific food triggers. Shaun Clarke notes that his immune system is compromised due to previous chemotherapy, with blood work showing white counts hovering around normal levels.       Relevant past medical, surgical, family and social history reviewed and updated as indicated. Interim medical history since our last visit reviewed. Allergies and medications reviewed and updated.  Review of Systems  Constitutional:  Negative for chills and fever.  Eyes:  Negative for discharge.  Respiratory:  Negative for shortness of breath and wheezing.   Cardiovascular:  Negative for chest pain and leg swelling.  Gastrointestinal:  Positive for abdominal pain and blood in stool. Negative for constipation, diarrhea, nausea and vomiting.  Musculoskeletal:  Negative for back pain and gait problem.  Skin:  Negative for rash.  All other systems reviewed and are negative.   Per HPI unless specifically indicated above   Allergies as of 08/14/2023       Reactions   Naproxen Anaphylaxis, Swelling, Other (See Comments)   Swelling of face and lips   Gadolinium Derivatives Hives, Itching        Medication List        Accurate as of August 14, 2023  3:25 PM. If you have any questions, ask your nurse or doctor.          amoxicillin -clavulanate 875-125 MG tablet Commonly known as: AUGMENTIN  Take 1 tablet by mouth 2 (two) times  daily. Started by: Shaun Clarke   methocarbamol  500 MG tablet Commonly known as: ROBAXIN  Take 1 tablet (500 mg total) by mouth every 6 (six) hours as needed for muscle spasms.   pantoprazole  40 MG tablet Commonly known as: PROTONIX  Take 1 tablet (40 mg total) by mouth daily.         Objective:   BP 118/80   Pulse 71   Temp 98 F (36.7 C) (Oral)   Ht 5' 8 (1.727 m)   Wt 181 lb 6.4 oz (82.3 kg)   SpO2 94%   BMI 27.58 kg/m   Wt Readings from Last 3 Encounters:  08/14/23 181 lb 6.4 oz (82.3 kg)  01/04/23  190 lb 3.2 oz (86.3 kg)  12/21/22 180 lb (81.6 kg)    Physical Exam Physical Exam   CHEST: Lungs clear to auscultation. CARDIOVASCULAR: Regular rate and rhythm, no murmurs.      Abdomen: No abdominal pain on exam today.  Patient declines rectal exam   Assessment & Plan:   Problem List Items Addressed This Visit   None Visit Diagnoses       Diverticulitis    -  Primary   Relevant Medications   amoxicillin -clavulanate (AUGMENTIN ) 875-125 MG tablet   Other Relevant Orders   CBC with Differential/Platelet   CMP14+EGFR   Fecal occult blood, imunochemical     Rectal bleeding       Relevant Orders   CBC with Differential/Platelet   CMP14+EGFR   Fecal occult blood, imunochemical       Rectal   Rectal bleeding and lower abdominal pain Intermittent lower abdominal pain and rectal bleeding for 1.5 weeks. Diverticulosis present. Differential includes diverticulitis and colitis. Current episode less severe than previous. No hemorrhoids or anal fissures. - Order CBC to assess for anemia and infection. - Provide stool test card for occult blood. - Initiate antibiotics for possible diverticulitis or colitis per guidelines. - Advise monitoring for persistent rectal bleeding. - Recommend hydration and bland diet, avoid nuts and seeds.  B-cell lymphoma in remission B-cell lymphoma in remission post-chemotherapy and radiation. Recent PET scan negative for disease. No current treatment required.          Follow up plan: Return if symptoms worsen or fail to improve.  Counseling provided for all of the vaccine components Orders Placed This Encounter  Procedures   Fecal occult blood, imunochemical   CBC with Differential/Platelet   CMP14+EGFR    Shaun Levins, MD [facility] 08/14/2023, 3:25 PM

## 2023-08-15 LAB — CBC WITH DIFFERENTIAL/PLATELET
Basophils Absolute: 0.1 x10E3/uL (ref 0.0–0.2)
Basos: 1 %
EOS (ABSOLUTE): 0 x10E3/uL (ref 0.0–0.4)
Eos: 1 %
Hematocrit: 52.1 % — ABNORMAL HIGH (ref 37.5–51.0)
Hemoglobin: 16.9 g/dL (ref 13.0–17.7)
Immature Grans (Abs): 0 x10E3/uL (ref 0.0–0.1)
Immature Granulocytes: 0 %
Lymphocytes Absolute: 0.9 x10E3/uL (ref 0.7–3.1)
Lymphs: 11 %
MCH: 28.5 pg (ref 26.6–33.0)
MCHC: 32.4 g/dL (ref 31.5–35.7)
MCV: 88 fL (ref 79–97)
Monocytes Absolute: 0.6 x10E3/uL (ref 0.1–0.9)
Monocytes: 8 %
Neutrophils Absolute: 6.9 x10E3/uL (ref 1.4–7.0)
Neutrophils: 79 %
Platelets: 266 x10E3/uL (ref 150–450)
RBC: 5.93 x10E6/uL — ABNORMAL HIGH (ref 4.14–5.80)
RDW: 12.9 % (ref 11.6–15.4)
WBC: 8.6 x10E3/uL (ref 3.4–10.8)

## 2023-08-15 LAB — CMP14+EGFR
ALT: 16 IU/L (ref 0–44)
AST: 18 IU/L (ref 0–40)
Albumin: 4.7 g/dL (ref 3.9–4.9)
Alkaline Phosphatase: 87 IU/L (ref 44–121)
BUN/Creatinine Ratio: 13 (ref 10–24)
BUN: 15 mg/dL (ref 8–27)
Bilirubin Total: 0.6 mg/dL (ref 0.0–1.2)
CO2: 21 mmol/L (ref 20–29)
Calcium: 9.9 mg/dL (ref 8.6–10.2)
Chloride: 101 mmol/L (ref 96–106)
Creatinine, Ser: 1.18 mg/dL (ref 0.76–1.27)
Globulin, Total: 2.8 g/dL (ref 1.5–4.5)
Glucose: 103 mg/dL — ABNORMAL HIGH (ref 70–99)
Potassium: 4.7 mmol/L (ref 3.5–5.2)
Sodium: 138 mmol/L (ref 134–144)
Total Protein: 7.5 g/dL (ref 6.0–8.5)
eGFR: 70 mL/min/1.73 (ref >59)

## 2023-08-16 ENCOUNTER — Ambulatory Visit: Payer: Self-pay | Admitting: Family Medicine

## 2023-08-16 ENCOUNTER — Other Ambulatory Visit

## 2023-08-16 DIAGNOSIS — K5792 Diverticulitis of intestine, part unspecified, without perforation or abscess without bleeding: Secondary | ICD-10-CM

## 2023-08-16 DIAGNOSIS — K625 Hemorrhage of anus and rectum: Secondary | ICD-10-CM

## 2023-08-17 LAB — FECAL OCCULT BLOOD, IMMUNOCHEMICAL: Fecal Occult Bld: POSITIVE — AB

## 2023-08-19 ENCOUNTER — Other Ambulatory Visit: Payer: Self-pay

## 2023-08-19 DIAGNOSIS — K625 Hemorrhage of anus and rectum: Secondary | ICD-10-CM

## 2023-08-23 ENCOUNTER — Other Ambulatory Visit: Payer: Self-pay | Admitting: Family Medicine

## 2023-08-23 ENCOUNTER — Other Ambulatory Visit

## 2023-08-23 DIAGNOSIS — K625 Hemorrhage of anus and rectum: Secondary | ICD-10-CM | POA: Diagnosis not present

## 2023-08-23 LAB — CBC WITH DIFFERENTIAL/PLATELET
Basophils Absolute: 0.1 x10E3/uL (ref 0.0–0.2)
Basos: 2 %
EOS (ABSOLUTE): 0.2 x10E3/uL (ref 0.0–0.4)
Eos: 4 %
Hematocrit: 47.9 % (ref 37.5–51.0)
Hemoglobin: 15.2 g/dL (ref 13.0–17.7)
Immature Grans (Abs): 0 x10E3/uL (ref 0.0–0.1)
Immature Granulocytes: 0 %
Lymphocytes Absolute: 1 x10E3/uL (ref 0.7–3.1)
Lymphs: 24 %
MCH: 28.7 pg (ref 26.6–33.0)
MCHC: 31.7 g/dL (ref 31.5–35.7)
MCV: 90 fL (ref 79–97)
Monocytes Absolute: 0.4 x10E3/uL (ref 0.1–0.9)
Monocytes: 10 %
Neutrophils Absolute: 2.5 x10E3/uL (ref 1.4–7.0)
Neutrophils: 60 %
Platelets: 258 x10E3/uL (ref 150–450)
RBC: 5.3 x10E6/uL (ref 4.14–5.80)
RDW: 13.5 % (ref 11.6–15.4)
WBC: 4.2 x10E3/uL (ref 3.4–10.8)

## 2023-08-26 ENCOUNTER — Other Ambulatory Visit

## 2023-08-26 DIAGNOSIS — K625 Hemorrhage of anus and rectum: Secondary | ICD-10-CM

## 2023-08-28 LAB — FECAL OCCULT BLOOD, IMMUNOCHEMICAL: Fecal Occult Bld: NEGATIVE

## 2023-08-29 ENCOUNTER — Ambulatory Visit: Payer: Self-pay | Admitting: Family Medicine

## 2023-08-29 DIAGNOSIS — D47Z9 Other specified neoplasms of uncertain behavior of lymphoid, hematopoietic and related tissue: Secondary | ICD-10-CM | POA: Diagnosis not present

## 2023-08-29 NOTE — Telephone Encounter (Signed)
 Reviewed results with patient per PCP notes and patient voiced understanding.

## 2023-08-30 ENCOUNTER — Ambulatory Visit: Payer: Self-pay | Admitting: Family Medicine

## 2023-11-26 DIAGNOSIS — C8330 Diffuse large B-cell lymphoma, unspecified site: Secondary | ICD-10-CM | POA: Diagnosis not present

## 2023-11-26 DIAGNOSIS — D47Z9 Other specified neoplasms of uncertain behavior of lymphoid, hematopoietic and related tissue: Secondary | ICD-10-CM | POA: Diagnosis not present

## 2023-11-26 DIAGNOSIS — C8290 Follicular lymphoma, unspecified, unspecified site: Secondary | ICD-10-CM | POA: Diagnosis not present

## 2023-11-26 DIAGNOSIS — C8338 Diffuse large B-cell lymphoma, lymph nodes of multiple sites: Secondary | ICD-10-CM | POA: Diagnosis not present

## 2023-11-27 DIAGNOSIS — C83398 Diffuse large b-cell lymphoma of other extranodal and solid organ sites: Secondary | ICD-10-CM | POA: Diagnosis not present

## 2023-11-27 DIAGNOSIS — D47Z9 Other specified neoplasms of uncertain behavior of lymphoid, hematopoietic and related tissue: Secondary | ICD-10-CM | POA: Diagnosis not present

## 2024-01-19 ENCOUNTER — Other Ambulatory Visit: Payer: Self-pay | Admitting: Family Medicine

## 2024-01-19 DIAGNOSIS — K219 Gastro-esophageal reflux disease without esophagitis: Secondary | ICD-10-CM

## 2024-01-20 NOTE — Telephone Encounter (Signed)
 Appt scheduled for 02/12/2024

## 2024-01-20 NOTE — Telephone Encounter (Signed)
 [facility] NTBS last OV for chronic FU 11/27/21 others have been acutes NO RF sent to pharmacy last OV greater than a year

## 2024-01-21 ENCOUNTER — Other Ambulatory Visit: Payer: Self-pay | Admitting: Family Medicine

## 2024-01-21 DIAGNOSIS — K219 Gastro-esophageal reflux disease without esophagitis: Secondary | ICD-10-CM

## 2024-02-12 ENCOUNTER — Encounter: Payer: Self-pay | Admitting: Family Medicine

## 2024-02-12 ENCOUNTER — Encounter: Payer: Self-pay | Admitting: Hematology and Oncology

## 2024-02-12 ENCOUNTER — Ambulatory Visit: Admitting: Family Medicine

## 2024-02-12 VITALS — BP 124/81 | HR 76 | Ht 68.0 in | Wt 193.0 lb

## 2024-02-12 DIAGNOSIS — K219 Gastro-esophageal reflux disease without esophagitis: Secondary | ICD-10-CM

## 2024-02-12 DIAGNOSIS — E785 Hyperlipidemia, unspecified: Secondary | ICD-10-CM | POA: Diagnosis not present

## 2024-02-12 DIAGNOSIS — Z125 Encounter for screening for malignant neoplasm of prostate: Secondary | ICD-10-CM | POA: Diagnosis not present

## 2024-02-12 DIAGNOSIS — F17200 Nicotine dependence, unspecified, uncomplicated: Secondary | ICD-10-CM | POA: Diagnosis not present

## 2024-02-12 MED ORDER — PANTOPRAZOLE SODIUM 40 MG PO TBEC: 40.0000 mg | DELAYED_RELEASE_TABLET | Freq: Every day | ORAL | 3 refills | Status: AC

## 2024-02-12 NOTE — Progress Notes (Addendum)
 "  BP 124/81   Pulse 76   Ht 5' 8 (1.727 m)   Wt 193 lb (87.5 kg)   SpO2 96%   BMI 29.35 kg/m    Subjective:   Patient ID: Shaun Clarke, male    DOB: 1960-03-28, 64 y.o.   MRN: [id number]  HPI: Shaun Clarke is a 64 y.o. male presenting on 02/12/2024 for Medical Management of Chronic Issues   Discussed the use of AI scribe software for clinical note transcription with the patient, who gave verbal consent to proceed.  History of Present Illness   Shaun Clarke is a 64 year old male with a history of cancer who presents for routine follow-up and medication management.  Oncologic history and hematologic status - History of cancer with ongoing surveillance - Blood work in November at [facility] showed improved blood counts - White blood cell count approximately 5  Musculoskeletal pain - Upper back pain associated with a known spinal lesion - Describes pain as consistent with prior symptoms ('normal stuff')  Gastroesophageal reflux disease (gerd) and esophageal lesions - Takes Protonix  almost daily for reflux symptoms - Uncertain of current efficacy of Protonix  - Endoscopy performed prior to chemotherapy and again last year for monitoring of esophageal lesions - Esophageal lesions have remained unchanged for the past three years - Reflux flare-ups can take up to two months to resolve if Protonix  doses are missed  Financial barriers to care - Experiencing financial difficulties due to high deductibles and cost of scans and treatments - On social security disability and awaiting Medicare eligibility - Managing payments through an the [employer] program but concerned about sustainability of this arrangement          Relevant past medical, surgical, family and social history reviewed and updated as indicated. Interim medical history since our last visit reviewed. Allergies and medications reviewed and updated.  Review of Systems  Constitutional:  Negative for  chills and fever.  Eyes:  Negative for visual disturbance.  Respiratory:  Negative for shortness of breath and wheezing.   Cardiovascular:  Negative for chest pain and leg swelling.  Musculoskeletal:  Positive for arthralgias and back pain. Negative for gait problem.  Skin:  Negative for rash.  All other systems reviewed and are negative.   Per HPI unless specifically indicated above   Allergies as of 02/12/2024       Reactions   Naproxen Anaphylaxis, Swelling, Other (See Comments)   Swelling of face and lips   Gadolinium Derivatives Hives, Itching        Medication List        Accurate as of February 12, 2024  2:11 PM. If you have any questions, ask your nurse or doctor.          STOP taking these medications    amoxicillin -clavulanate 875-125 MG tablet Commonly known as: AUGMENTIN  Stopped by: Fonda Levins, MD       TAKE these medications    methocarbamol  500 MG tablet Commonly known as: ROBAXIN  Take 1 tablet (500 mg total) by mouth every 6 (six) hours as needed for muscle spasms.   pantoprazole  40 MG tablet Commonly known as: PROTONIX  Take 1 tablet (40 mg total) by mouth daily.         Objective:   BP 124/81   Pulse 76   Ht 5' 8 (1.727 m)   Wt 193 lb (87.5 kg)   SpO2 96%   BMI 29.35 kg/m   Wt Readings from Last 3 Encounters:  02/12/24  193 lb (87.5 kg)  08/14/23 181 lb 6.4 oz (82.3 kg)  01/04/23 190 lb 3.2 oz (86.3 kg)    Physical Exam Vitals and nursing note reviewed.  Constitutional:      General: He is not in acute distress.    Appearance: He is well-developed. He is not diaphoretic.  Eyes:     General: No scleral icterus.    Conjunctiva/sclera: Conjunctivae normal.  Neck:     Thyroid: No thyromegaly.  Cardiovascular:     Rate and Rhythm: Normal rate and regular rhythm.     Heart sounds: Normal heart sounds. No murmur heard. Pulmonary:     Effort: Pulmonary effort is normal. No respiratory distress.     Breath sounds: Normal  breath sounds. No wheezing.  Musculoskeletal:        General: No swelling. Normal range of motion.     Cervical back: Neck supple.  Lymphadenopathy:     Cervical: No cervical adenopathy.  Skin:    General: Skin is warm and dry.     Findings: No rash.  Neurological:     Mental Status: He is alert and oriented to person, place, and time.     Coordination: Coordination normal.  Psychiatric:        Behavior: Behavior normal.    Physical Exam   CHEST: Lungs clear to auscultation bilaterally. CARDIOVASCULAR: Heart sounds regular.         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 Financial constraints affecting scan and treatment frequency. Eligible for Medicare in 2026. - Consider reducing frequency of PET scans to manage costs. - Discussed financial assistance programs and payment plans.  Gastroesophageal reflux disease Chronic GERD managed with Protonix . Occasional flare-ups if medication missed. Stable lesions on endoscopy. - Continue Protonix  daily. - Monitor for flare-ups and adjust treatment as necessary.  General health maintenance Routine health maintenance discussed. Importance of monitoring kidney, liver, and calcium levels due to medication use. - Reviewed recent blood work results from November. - Continue routine monitoring of kidney, liver, and calcium levels.          Follow up plan: Return if symptoms worsen or fail to improve, for Physical exam and recheck in 3 to 5 months.  Counseling provided for all of the vaccine components No orders of the defined types were placed in this encounter.   Fonda Levins, MD [facility] 02/12/2024, 2:11 PM     "

## 2024-02-12 NOTE — Addendum Note (Signed)
 Addended by: MARYANNE CHEW on: 02/12/2024 02:11 PM   Modules accepted: Orders, Level of Service

## 2024-05-12 ENCOUNTER — Other Ambulatory Visit

## 2024-05-14 ENCOUNTER — Encounter: Admitting: Family Medicine
